# Patient Record
Sex: Female | Born: 1980 | Race: Black or African American | Hispanic: No | Marital: Single | Smoking: Never smoker
Health system: Southern US, Community
[De-identification: ages and names within clinical notes are randomized; demographics above are authoritative.]

## PROBLEM LIST (undated history)

## (undated) DIAGNOSIS — E785 Hyperlipidemia, unspecified: Secondary | ICD-10-CM

## (undated) DIAGNOSIS — E669 Obesity, unspecified: Secondary | ICD-10-CM

## (undated) DIAGNOSIS — G4733 Obstructive sleep apnea (adult) (pediatric): Secondary | ICD-10-CM

## (undated) DIAGNOSIS — D249 Benign neoplasm of unspecified breast: Secondary | ICD-10-CM

## (undated) DIAGNOSIS — I10 Essential (primary) hypertension: Secondary | ICD-10-CM

## (undated) DIAGNOSIS — D649 Anemia, unspecified: Secondary | ICD-10-CM

## (undated) DIAGNOSIS — D259 Leiomyoma of uterus, unspecified: Secondary | ICD-10-CM

## (undated) HISTORY — DX: Essential (primary) hypertension: I10

## (undated) HISTORY — DX: Obstructive sleep apnea (adult) (pediatric): G47.33

## (undated) HISTORY — DX: Benign neoplasm of unspecified breast: D24.9

## (undated) HISTORY — DX: Hyperlipidemia, unspecified: E78.5

## (undated) HISTORY — PX: ABDOMINAL HYSTERECTOMY: SHX81

## (undated) HISTORY — DX: Anemia, unspecified: D64.9

## (undated) HISTORY — DX: Obesity, unspecified: E66.9

## (undated) HISTORY — PX: BREAST BIOPSY: SHX20

## (undated) HISTORY — PX: UVULOPALATOPHARYNGOPLASTY: SHX827

## (undated) HISTORY — PX: TONSILLECTOMY AND ADENOIDECTOMY: SUR1326

---

## 2008-06-15 HISTORY — PX: BREAST EXCISIONAL BIOPSY: SUR124

## 2010-08-25 ENCOUNTER — Ambulatory Visit: Payer: Self-pay | Admitting: Family Medicine

## 2010-09-22 ENCOUNTER — Ambulatory Visit (INDEPENDENT_AMBULATORY_CARE_PROVIDER_SITE_OTHER): Payer: Managed Care, Other (non HMO) | Admitting: Family Medicine

## 2010-09-22 ENCOUNTER — Encounter: Payer: Self-pay | Admitting: Family Medicine

## 2010-09-22 VITALS — BP 140/100 | HR 90 | Temp 98.3°F | Wt 221.5 lb

## 2010-09-22 DIAGNOSIS — Z9889 Other specified postprocedural states: Secondary | ICD-10-CM

## 2010-09-22 DIAGNOSIS — E119 Type 2 diabetes mellitus without complications: Secondary | ICD-10-CM

## 2010-09-22 DIAGNOSIS — E663 Overweight: Secondary | ICD-10-CM

## 2010-09-22 DIAGNOSIS — D249 Benign neoplasm of unspecified breast: Secondary | ICD-10-CM

## 2010-09-22 DIAGNOSIS — G4733 Obstructive sleep apnea (adult) (pediatric): Secondary | ICD-10-CM

## 2010-09-22 DIAGNOSIS — I10 Essential (primary) hypertension: Secondary | ICD-10-CM

## 2010-09-22 DIAGNOSIS — N920 Excessive and frequent menstruation with regular cycle: Secondary | ICD-10-CM

## 2010-09-22 DIAGNOSIS — E785 Hyperlipidemia, unspecified: Secondary | ICD-10-CM

## 2010-09-22 MED ORDER — METFORMIN HCL 850 MG PO TABS: 850.0000 mg | ORAL_TABLET | Freq: Two times a day (BID) | ORAL | Status: DC

## 2010-09-22 NOTE — Patient Instructions (Addendum)
Start back walking. Come back for fasting labs.  You can get your results through our phone system.  Follow the instructions on the blue card. I'll await your records. Come back to for another lab visit in 3 months with a OV a few days after that.  Check your sugar before you come in for that visit.  Take care.   See Shirlee Limerick about your referral before your leave today.

## 2010-09-24 ENCOUNTER — Encounter: Payer: Self-pay | Admitting: Family Medicine

## 2010-09-24 ENCOUNTER — Other Ambulatory Visit (INDEPENDENT_AMBULATORY_CARE_PROVIDER_SITE_OTHER): Payer: Managed Care, Other (non HMO) | Admitting: Family Medicine

## 2010-09-24 ENCOUNTER — Telehealth: Payer: Self-pay | Admitting: *Deleted

## 2010-09-24 DIAGNOSIS — G4733 Obstructive sleep apnea (adult) (pediatric): Secondary | ICD-10-CM | POA: Insufficient documentation

## 2010-09-24 DIAGNOSIS — E119 Type 2 diabetes mellitus without complications: Secondary | ICD-10-CM

## 2010-09-24 DIAGNOSIS — I10 Essential (primary) hypertension: Secondary | ICD-10-CM | POA: Insufficient documentation

## 2010-09-24 DIAGNOSIS — E663 Overweight: Secondary | ICD-10-CM | POA: Insufficient documentation

## 2010-09-24 DIAGNOSIS — Z9889 Other specified postprocedural states: Secondary | ICD-10-CM | POA: Insufficient documentation

## 2010-09-24 DIAGNOSIS — E876 Hypokalemia: Secondary | ICD-10-CM

## 2010-09-24 DIAGNOSIS — E785 Hyperlipidemia, unspecified: Secondary | ICD-10-CM | POA: Insufficient documentation

## 2010-09-24 DIAGNOSIS — E1129 Type 2 diabetes mellitus with other diabetic kidney complication: Secondary | ICD-10-CM | POA: Insufficient documentation

## 2010-09-24 DIAGNOSIS — N920 Excessive and frequent menstruation with regular cycle: Secondary | ICD-10-CM | POA: Insufficient documentation

## 2010-09-24 LAB — COMPREHENSIVE METABOLIC PANEL
AST: 15 U/L (ref 0–37)
Albumin: 3.9 g/dL (ref 3.5–5.2)
BUN: 13 mg/dL (ref 6–23)
CO2: 33 mEq/L — ABNORMAL HIGH (ref 19–32)
Calcium: 8.6 mg/dL (ref 8.4–10.5)
Chloride: 99 mEq/L (ref 96–112)
Creatinine, Ser: 1 mg/dL (ref 0.4–1.2)
GFR: 80.81 mL/min (ref >60.00)
Glucose, Bld: 155 mg/dL — ABNORMAL HIGH (ref 70–99)
Potassium: 2.8 mEq/L — CL (ref 3.5–5.1)

## 2010-09-24 LAB — LIPID PANEL
Cholesterol: 101 mg/dL (ref 0–200)
LDL Cholesterol: 52 mg/dL (ref 0–99)
Triglycerides: 58 mg/dL (ref 0.0–149.0)

## 2010-09-24 MED ORDER — POTASSIUM CHLORIDE CRYS ER 20 MEQ PO TBCR: 20.0000 meq | EXTENDED_RELEASE_TABLET | Freq: Two times a day (BID) | ORAL | Status: DC

## 2010-09-24 NOTE — Assessment & Plan Note (Signed)
D/w pt about weight and diet, continue meds and return for labs.  

## 2010-09-24 NOTE — Assessment & Plan Note (Signed)
D/w pt about weight and diet, continue meds and return for labs.  Requesting records.

## 2010-09-24 NOTE — Assessment & Plan Note (Signed)
D/w pt about weight and diet

## 2010-09-24 NOTE — Progress Notes (Signed)
Diabetes:  Using medications without difficulties:yes Hypoglycemic episodes:no Hyperglycemic episodes:no Feet problems:no Blood Sugars averaging: rarely checked  Hypertension:    Using medication without problems or lightheadedness: yes Chest pain with exertion:no Edema:no Short of breath:no Average home BPs:no Requesting records.   Needs labs and needs referral for GYN eval.    Overweight, prev controlled with walking.  Hasn't been exercising.   PMH and SH reviewed  Meds, vitals, and allergies reviewed.   ROS: See HPI.  Otherwise negative.    GEN: nad, alert and oriented, overweight HEENT: mucous membranes moist, acanthosis noted on neck NECK: supple w/o LA CV: rrr. PULM: ctab, no inc wob ABD: soft, +bs EXT: no edema SKIN: no acute rash  Diabetic foot exam: Normal inspection No skin breakdown No calluses  Normal DP pulses Normal sensation to light touch and monofilament Nails normal

## 2010-09-24 NOTE — Telephone Encounter (Signed)
Critical labs potassium is 2.8 Results not in epic yet.

## 2010-09-24 NOTE — Telephone Encounter (Signed)
Patient advised as instructed via telephone.  She will call back to schedule lab appt.

## 2010-09-24 NOTE — Assessment & Plan Note (Signed)
Refer

## 2010-09-24 NOTE — Assessment & Plan Note (Signed)
D/w pt about weight and diet 

## 2010-09-24 NOTE — Telephone Encounter (Signed)
Please call pt.  K is low.  She needs to take K Dur twice a day for 5 days and then recheck K and aldosterone level here at the clinic (in about 5 days).  When I get the other labs back, I'll notify her.  Thanks.

## 2010-09-24 NOTE — Assessment & Plan Note (Signed)
D/w pt about weight and diet, continue meds and return for labs.

## 2010-10-01 ENCOUNTER — Telehealth: Payer: Self-pay | Admitting: Radiology

## 2010-10-01 ENCOUNTER — Other Ambulatory Visit (INDEPENDENT_AMBULATORY_CARE_PROVIDER_SITE_OTHER): Payer: Managed Care, Other (non HMO)

## 2010-10-01 DIAGNOSIS — E876 Hypokalemia: Secondary | ICD-10-CM

## 2010-10-01 LAB — POTASSIUM: Potassium: 2.7 mEq/L — CL (ref 3.5–5.1)

## 2010-10-01 MED ORDER — POTASSIUM CHLORIDE CRYS ER 20 MEQ PO TBCR: 20.0000 meq | EXTENDED_RELEASE_TABLET | Freq: Two times a day (BID) | ORAL | Status: DC

## 2010-10-01 NOTE — Telephone Encounter (Signed)
[facility] called with a critical K+ of 2.7.

## 2010-10-01 NOTE — Telephone Encounter (Signed)
Please notify patient - did she take kdur as prescribed last week?  Will want her to start kdur 20 mEq bid regularly (Sent in).  Also encourage more fruits/vegetables.  Will need to return in 1 wk for repeat potassium level.  Will route to PCP.

## 2010-10-02 NOTE — Telephone Encounter (Signed)
Patient notified. She said she did take the K+ as prescribed. She eats plenty of fruits and vegetables, so she's not sure what the problem is. She will restart the K+ and lab appt scheduled for next for repeat.

## 2010-10-02 NOTE — Telephone Encounter (Signed)
Message left for patient to return my call.  

## 2010-10-03 LAB — ALDOSTERONE: Aldosterone, Serum: 28 ng/dL

## 2010-10-04 NOTE — Telephone Encounter (Signed)
Please call pt.  I want to see the repeat K at the next draw.  The borderline high aldosterone level may potentially be related to the low K and the hypertension. I want to see the next value and then we'll contact her.

## 2010-10-05 NOTE — Telephone Encounter (Signed)
Patient advised.

## 2010-10-06 ENCOUNTER — Encounter: Payer: Self-pay | Admitting: Obstetrics & Gynecology

## 2010-10-06 ENCOUNTER — Ambulatory Visit (INDEPENDENT_AMBULATORY_CARE_PROVIDER_SITE_OTHER): Payer: Managed Care, Other (non HMO) | Admitting: Obstetrics & Gynecology

## 2010-10-06 VITALS — BP 133/95 | HR 72 | Ht 65.0 in | Wt 227.0 lb

## 2010-10-06 DIAGNOSIS — D259 Leiomyoma of uterus, unspecified: Secondary | ICD-10-CM

## 2010-10-06 DIAGNOSIS — Z01419 Encounter for gynecological examination (general) (routine) without abnormal findings: Secondary | ICD-10-CM

## 2010-10-06 DIAGNOSIS — Z113 Encounter for screening for infections with a predominantly sexual mode of transmission: Secondary | ICD-10-CM

## 2010-10-06 DIAGNOSIS — D219 Benign neoplasm of connective and other soft tissue, unspecified: Secondary | ICD-10-CM

## 2010-10-06 DIAGNOSIS — Z1272 Encounter for screening for malignant neoplasm of vagina: Secondary | ICD-10-CM

## 2010-10-06 DIAGNOSIS — Z3009 Encounter for other general counseling and advice on contraception: Secondary | ICD-10-CM

## 2010-10-06 MED ORDER — NORETHIN ACE-ETH ESTRAD-FE 1.5-30 MG-MCG PO TABS: 1.0000 | ORAL_TABLET | Freq: Every day | ORAL | Status: DC

## 2010-10-06 NOTE — Progress Notes (Signed)
  Subjective:     Susan Bridges is a 30 y.o. G0 female here for a routine exam.  Current complaints: history of fibroids, reports always having heavy menstrual periods, desires refill of OCPs. Denies intermenstrual bleeding or pain; reports history of anemia in the past.  Also reports history of fibroadenoma of the left breast which is stable; had negative biopsies in the past. Last breast imaging was in 01/2010.    Gynecologic History Patient's last menstrual period was 09/18/2010. Contraception: none Last Pap: 2011. Results were: normal  Obstetric History G0  The following portions of the patient's history were reviewed and updated as appropriate: allergies, current medications, past family history, past medical history, past social history, past surgical history and problem list.  Review of Systems Pertinent items are noted in HPI.    Objective:    GENERAL: Well-developed, well-nourished female in no acute distress.  HEENT: Normocephalic, atraumatic. Sclerae anicteric.  NECK: Supple. Normal thyroid.  LUNGS: Clear to auscultation bilaterally.  HEART: Regular rate and rhythm. BREASTS: Symmetric with everted nipples.  Large (~ 5cm) mass noted in the left breast under nipple and towards outer quadrants, well healed biopsy sites.  No other masses, skin changes, nipple drainage, lymphadenopathy bilaterally.. ABDOMEN: Soft, obese, nontender, nondistended. No organomegaly. PELVIC: Normal external female genitalia. Vagina is pink and rugated. Thick, white discharge. Normal cervix contour. Uterus is normal in size. No adnexal mass or tenderness. Pap smear and wet prep obtained.  EXTREMITIES: No cyanosis, clubbing, or edema, 2+ distal pulses.     Assessment:    Healthy female exam. Desires STI screen, OCPs and follow up of fibroids.   Plan:    Education reviewed: safe sex/STD prevention, self breast exams and management of fibroids. Contraception: OCP (estrogen/progesterone). Follow  up in: As needed for any concerning symptoms. Pelvic ultrasound ordered, will follow up results. Bleeding precautions reviewed; PCP to check CBC later this week.  May need endometrial biopsy if menorrhagia worsens or depending on ultrasound results.  Continue to follow up breast mass.   Follow up pap and STI screen. Counseled regarding following up with PCP regarding treatment of HTN; discussed risk of increased thromboembolic phenomenon and CV risks if HTN is uncontrolled, in the setting of combined OCPs. Discussed other forms of contraception and progestin-only methods, but she declined these methods.    Shila Kruczek A 10/06/2010 10:32 AM

## 2010-10-06 NOTE — Patient Instructions (Addendum)
Preventative Care for Adults - Female Studies show that half of deaths in the [place] today result from unhealthy lifestyle practices. This includes ignoring preventive care suggestions. Preventive health guidelines for women include the following key practices:  A routine yearly physical is a good way to check with your primary caregiver about your health and preventive screening. It is a chance to share any concerns and updates on your health, and to receive a thorough all-over exam.   If you smoke cigarettes, find out from your caregiver how to quit. It can literally save your life, no matter how long you have been a tobacco user. If you do not use tobacco, never start.   Maintain a healthy diet and normal weight. Increased weight leads to problems with blood pressure and diabetes. Decrease saturated fat in your diet and increase regular exercise. Eat a variety of foods, including fruit, vegetables, animal or vegetable protein (meat, fish, chicken, and eggs, or beans, lentils, and tofu), and grains, such as rice. Get information about proper diet from your caregiver, if needed.   Aerobic exercise helps maintain good heart health. The CDC and the American College of Sports Medicine recommend 30 minutes of moderate-intensity exercise (a brisk walk that increases your heart rate and breathing) on most days of the week. Ongoing high blood pressure should be treated with medicines, if weight loss and exercise are not effective.   Avoid smoking, drinking too much alcohol (more than two drinks per day), and use of street drugs. Do not share needles with anyone. Ask for professional help if you need support or instructions about stopping the use of alcohol, cigarettes, or drugs.   Maintain normal blood lipids and cholesterol, by minimizing your intake of saturated fat. Eat a well rounded diet, with plenty of fruit and vegetables. The National Institutes of Health encourage women to eat 5-9 servings of  fruit and vegetables each day. Your caregiver can give instructions to help you keep your risk of heart disease or stroke low. High blood pressure causes heart disease and increases risk of stroke. Blood pressure should be checked every 1-2 years, from age 20 onward.   Blood tests for high cholesterol, which causes heart and vessel disease, should begin at age 20 and be repeated every 5 years, if test results are normal. (Repeat tests more often if results are high.)   Diabetes screening involves taking a blood sample to check your blood sugar level, after a fasting period. This is done once every 3 years, after age 45, if test results are normal.   Breast cancer screening is essential to preventive care for women. All women age 20 and older should perform a breast self-exam every month. At age 40 and older, women should have their caregiver complete a breast exam each year. Women at ages 40-50 should have a mammogram (x-ray film) of the breasts each year. Your caregiver can discuss when to start your yearly mammograms.   Cervical cancer screening includes taking a Pap smear (sample of cells examined under a microscope) from the cervix (end of the uterus). It also includes testing for HPV (Human Papilloma Virus, which can cause cervical cancer). Screening and a pelvic exam should begin at age 21, or 3 years after a woman becomes sexually active. Screening should occur every year, with a Pap smear but no HPV testing, up to age 30. After age 30, you should have a Pap smear every 3 years with HPV testing, if no HPV was found previously.     Colon cancer can be detected, and often prevented, long before it is life threatening. Most routine colon cancer screening begins at the age of 50. On a yearly basis, your caregiver may provide easy-to-use take-home tests to check for hidden blood in the stool. Use of a small camera at the end of a tube, to directly examine the colon (sigmoidoscopy or colonoscopy), can  detect the earliest forms of colon cancer and can be life saving. Talk to your caregiver about this at age 50, when routine screening begins. (Screening is repeated every 5 years, unless early forms of pre-cancerous polyps or small growths are found.)   Practice safe sex. Use condoms. Condoms are used for birth control and to reduce the spread of sexually transmitted infections (STIs). Unsafe sex is sexual activity without the use of safeguards, such as condoms and avoidance of high-risk acts, to reduce the chances of getting or spreading STIs. STIs include gonorrhea (the clap), chlamydia, syphilis, trichimonas, herpes, HPV (human papilloma virus) and HIV (human immunodeficiency virus), which causes AIDS. Herpes, HIV, and HPV are viral illnesses that have no cure. They can result in disability, cancer, and death.   HPV causes cancer of the cervix, and other infections that can be transmitted from person to person. There is a vaccine for HPV, and females should get immunized between the ages of 11 and 26. It requires a series of 3 shots.   Osteoporosis is a disease in which the bones lose minerals and strength as we age. This can result in serious bone fractures. Risk of osteoporosis can be identified using a bone density scan. Women ages 65 and over should discuss this with their caregivers, as should women after menopause who have other risk factors. Ask your caregiver whether you should be taking a calcium supplement and Vitamin D, to reduce the rate of osteoporosis.   Menopause can be associated with physical symptoms and risks. Hormone replacement therapy is available to decrease these. You should talk to your caregiver about whether starting or continuing to take hormones is right for you.   Use sunscreen with SPF (skin protection factor) of 15 or more. Apply sunscreen liberally and repeatedly throughout the day. Being outside in the sun, when your shadow is shorter than you are, means you are being  exposed to sun at greater intensity. Lighter skinned people are at a greater risk of skin cancer. Wear sunglasses, to protect your eyes from too much damaging sunlight (which can speed up cataract formation).   Once a month, do a whole body skin exam or review, using a mirror to look at your back. Notify your caregiver of changes in moles, especially if there are changes in shapes, colors, irregular border, a size larger than a pencil eraser, or new moles develop.   Keep carbon monoxide and smoke detectors in your home, and functioning, at all times. Change the batteries every 6 months, or use a model that plugs into the wall.   Stay up to date with your tetanus shots and other required immunizations. A booster for tetanus should be given every 10 years. Be sure to get your flu shot every year, since 5%-20% of the [place] population comes down with the flu. The composition of the flu vaccine changes each year, so being vaccinated once is not enough. Get your shot in the fall, before the flu season peaks. The table below lists important vaccines to get. Other vaccines to consider include for Hepatitis A virus (to prevent a form of   infection of the liver, by a virus acquired from food), Varicella Zoster (a virus that causes shingles), and Meninogoccal (against bacteria which cause a form of meningitis).   Brush your teeth twice a day with fluoride toothpaste, and floss once a day. Good oral hygiene prevents tooth decay and gum disease, which can be painful and can cause other health problems. Visit your dentist for a routine oral and dental check up and preventive care every 6-12 months.   The Body Mass Index or BMI is a way of measuring how much of your body is fat. Having a BMI above 27 increases the risk of heart disease, diabetes, hypertension, stroke and other problems related to obesity. Your caregiver can help determine your BMI, and can develop an exercise and dietary program to help you achieve or  maintain this measurement at a healthy level.   Wear seat belts whenever you are in a vehicle, whether as passenger or driver, and even for short drives of a few minutes.   If you bicycle, wear a helmet at all times.  Preventative Care for Adult Women  Preventative Services Ages 76-39 Ages 2-64 Ages 47 and over  Health risk assessment and lifestyle counseling.     Blood pressure check.** Every 1-2 years Every 1-2 years Every 1-2 years  Total cholesterol check including HDL.** Every 5 years beginning at age 80 Every 5 years beginning at age 21, or more often if risk is high Every 5 years through age 29, then optional  Breast self exam. Monthly in all women ages 31 and older Monthly Monthly  Clinical breast exam.** Every 3 years beginning at age 71 Every year Every year  Mammogram.**  Every year beginning at age 23, optional from age 69-49 (discuss with your caregiver). Every year until age 69, then optional  Pap Smear** and HPV Screening. Every year from ages 34 through 53 Every 3 years from ages 69 through 22, if HPV is negative Optional; talk with your caregiver  Flexible sigmoidoscopy** or colonoscopy.**   Every 5 years beginning at age 10 Every 5 years until age 104; then optional  FOBT (fecal occult blood test) of stool.  Every year beginning at age 66 Every year until 50; then optional  Skin self-exam. Monthly Monthly Monthly  Tetanus-diphtheria (Td) immunization. Every 10 years Every 10 years Every 10 years  Influenza immunization.** Every year Every year Every year  HPV immunization. Once between the ages of 60 and 56     Pneumococcal immunization.** Optional Optional Every 5 years  Hepatitis B immunization.** Series of 3 immunizations  (if not done previously, usually given at 0, 1 to 2, and 4 to 6 months)  Check with your caregiver, if vaccination not previously given Check with your caregiver, if vaccination not previously given  ** Family history and personal history of risk and  conditions may change your caregiver's recommendations.  Document Released: 03/30/2001 Document Re-Released: 04/28/2009 [facility] Patient Information 2011 [place], [place].  Fibroids (Leiomyoma's) You have been diagnosed as having a fibroid. Fibroids are smooth muscle lumps (tumors) which can occur any place in a woman's body. They are usually in the womb (uterus). The most common problem (symptom) of fibroids is bleeding. Over time this may cause low red blood cells (anemia). Other symptoms include feelings of pressure and pain in the pelvis. The diagnosis (learning what is wrong) of fibroids is made by physical exam. Sometimes tests such as an ultrasound are used. This is helpful when fibroids are felt around  the ovaries and to look for tumors. TREATMENT  Most fibroids do not need surgical or medical treatment. Sometimes a tissue sample (biopsy) of the lining of the uterus is done to rule out cancer. If there is no cancer and only a small amount of bleeding, the problem can be watched.   Hormonal treatment can improve the problem.   When surgery is needed, it can consist of removing the fibroid. Vaginal birth may not be possible after the removal of fibroids. This depends on where they are and the extent of surgery. When pregnancy occurs with fibroids it is usually normal.   Your caregiver can help decide which treatments are best for you.  HOME CARE INSTRUCTIONS  Do not use aspirin as this may increase bleeding problems.   If your periods (menses) are heavy, record the number of pads or tampons used per month. Bring this information to your caregiver. This can help them determine the best treatment for you.  SEEK IMMEDIATE MEDICAL ATTENTION IF:  You have pelvic pain or cramps not controlled with medications, or experience a sudden increase in pain.   You have an increase of pelvic bleeding between and during menses.   You feel lightheaded or have fainting spells.   You develop worsening  belly (abdominal) pain.  Document Released: 01/30/2000 Document Re-Released: 05/11/2007 [facility] Patient Information 2011 [place], [place].

## 2010-10-07 LAB — WET PREP, GENITAL
Trich, Wet Prep: NONE SEEN
Yeast Wet Prep HPF POC: NONE SEEN

## 2010-10-07 LAB — HEPATITIS C ANTIBODY: HCV Ab: NEGATIVE

## 2010-10-09 ENCOUNTER — Other Ambulatory Visit (INDEPENDENT_AMBULATORY_CARE_PROVIDER_SITE_OTHER): Payer: Managed Care, Other (non HMO) | Admitting: Family Medicine

## 2010-10-09 DIAGNOSIS — E119 Type 2 diabetes mellitus without complications: Secondary | ICD-10-CM

## 2010-10-09 DIAGNOSIS — E876 Hypokalemia: Secondary | ICD-10-CM

## 2010-10-09 LAB — POTASSIUM: Potassium: 3.3 mEq/L — ABNORMAL LOW (ref 3.5–5.1)

## 2010-10-09 NOTE — Progress Notes (Signed)
Addended by: Alvina Chou on: 10/09/2010 09:39 AM   Modules accepted: Orders

## 2010-10-12 ENCOUNTER — Telehealth: Payer: Self-pay | Admitting: Family Medicine

## 2010-10-12 DIAGNOSIS — I10 Essential (primary) hypertension: Secondary | ICD-10-CM

## 2010-10-12 MED ORDER — SPIRONOLACTONE 25 MG PO TABS: 25.0000 mg | ORAL_TABLET | Freq: Every day | ORAL | Status: DC

## 2010-10-12 MED ORDER — TRIAMTERENE-HCTZ 37.5-25 MG PO CAPS: 1.0000 | ORAL_CAPSULE | ORAL | Status: DC

## 2010-10-12 NOTE — Telephone Encounter (Signed)
Patient advised. Appt scheduled. 

## 2010-10-12 NOTE — Telephone Encounter (Signed)
Please call pt.  I would dec the triamterene/HCTZ to 1 pill a day and add on spironolactone 25mg  a day.  Recheck BMET next week at OV with Dr Reece Agar (since I'll likely be out).  I have talked to him about this. The spironolactone may help with BP and correct the low K level (still a little low, but improved).  Stop the extra potassium in the meantime.   If the spironolactone helps, we may be able to taper her off some of other other meds.  Thanks.

## 2010-10-12 NOTE — Telephone Encounter (Signed)
Message copied by Lars Mage on Mon Oct 12, 2010  1:17 PM ------      Message from: Alvina Chou      Created: Fri Oct 09, 2010  2:33 PM                   ----- Message -----         From: [facility] [place] Interface         Sent: 10/09/2010   2:30 PM           To: Mills Koller

## 2010-10-22 ENCOUNTER — Other Ambulatory Visit: Payer: Self-pay | Admitting: Family Medicine

## 2010-10-22 ENCOUNTER — Encounter: Payer: Self-pay | Admitting: Family Medicine

## 2010-10-22 ENCOUNTER — Ambulatory Visit (INDEPENDENT_AMBULATORY_CARE_PROVIDER_SITE_OTHER): Payer: Managed Care, Other (non HMO) | Admitting: Family Medicine

## 2010-10-22 VITALS — BP 128/80 | HR 72 | Temp 98.8°F | Wt 231.0 lb

## 2010-10-22 DIAGNOSIS — G4733 Obstructive sleep apnea (adult) (pediatric): Secondary | ICD-10-CM

## 2010-10-22 DIAGNOSIS — I10 Essential (primary) hypertension: Secondary | ICD-10-CM

## 2010-10-22 LAB — BASIC METABOLIC PANEL
CO2: 25 mEq/L (ref 19–32)
Calcium: 8.7 mg/dL (ref 8.4–10.5)
Creatinine, Ser: 1 mg/dL (ref 0.4–1.2)
GFR: 87.68 mL/min (ref >60.00)
Glucose, Bld: 170 mg/dL — ABNORMAL HIGH (ref 70–99)
Sodium: 139 mEq/L (ref 135–145)

## 2010-10-22 MED ORDER — SPIRONOLACTONE 25 MG PO TABS: 50.0000 mg | ORAL_TABLET | Freq: Every day | ORAL | Status: DC

## 2010-10-22 MED ORDER — LOSARTAN POTASSIUM 100 MG PO TABS: 50.0000 mg | ORAL_TABLET | Freq: Every day | ORAL | Status: DC

## 2010-10-22 MED ORDER — LOSARTAN POTASSIUM 50 MG PO TABS: 50.0000 mg | ORAL_TABLET | Freq: Every day | ORAL | Status: DC

## 2010-10-22 NOTE — Assessment & Plan Note (Signed)
Aldosterone borderline high. Check BMP today, consider increase in spironolactone.  If increased, slowly back off losartan. Discussed with PCP today. F/u 1 mo.

## 2010-10-22 NOTE — Progress Notes (Signed)
  Subjective:    Patient ID: Susan Bridges, female    DOB: 06/04/1980, 30 y.o.   MRN: [id number]  HPI CC: med f/u, low potassium  F/u HTN and hypokalemia.  Some leg swelling, worse as day progresses.  Sometimes notes pitting edema.  Tries to avoid salt in diet, drinks plenty of water (recent increase).  Does eat good amt fruits and vegetables, vegetable with at least one meal a day, does snack on 1 fruit during day.  BP Readings from Last 3 Encounters:  10/22/10 128/80  10/06/10 133/95  09/22/10 140/100   No HA, vision changes, CP/tightness, SOB.    Started OCP 10/06/2010.  Uses for fibroids and menorrhagia.  H/o OSA, but not on CPAP or other treatment.  Review of Systems Per HPI    Objective:   Physical Exam  Nursing note and vitals reviewed. Constitutional: She appears well-developed and well-nourished. No distress.  HENT:  Head: Normocephalic and atraumatic.  Mouth/Throat: Oropharynx is clear and moist. No oropharyngeal exudate.  Eyes: Conjunctivae and EOM are normal. Pupils are equal, round, and reactive to light. No scleral icterus.  Neck: Normal range of motion. Neck supple. Carotid bruit is not present.  Cardiovascular: Normal rate, regular rhythm, normal heart sounds and intact distal pulses.   No murmur heard. Pulmonary/Chest: Effort normal and breath sounds normal. No respiratory distress. She has no wheezes. She has no rales.  Abdominal: Soft. There is no tenderness.       No abd/renal bruit  Lymphadenopathy:    She has no cervical adenopathy.  Skin: Skin is warm and dry. No rash noted.          Assessment & Plan:

## 2010-10-22 NOTE — Patient Instructions (Addendum)
We will recheck blood work today. Good to see you, return to see Dr. Algis Downs in 1 month for follow up of blood pressure.  It's looking good today. Continue to avoid salt. We will call you with blood work results and next step (possible med changes).

## 2010-10-22 NOTE — Telephone Encounter (Signed)
Please call pt.  Her labs are fine.  I talked with Dr. Reece Agar and he agrees with the following plan.  I would increase the spironolactone to 2 pills a day (total 50mg , she can take them together) and cut the losartan in half (down to 50mg ).  Recheck in 1 month.  Thanks.  I adjusted the med list.

## 2010-10-22 NOTE — Telephone Encounter (Signed)
Patient advised.

## 2010-10-22 NOTE — Assessment & Plan Note (Signed)
Consider rpt sleep study. Per pt, improved sxs.

## 2010-10-23 ENCOUNTER — Telehealth: Payer: Self-pay | Admitting: Family Medicine

## 2010-10-23 MED ORDER — SPIRONOLACTONE 50 MG PO TABS: 50.0000 mg | ORAL_TABLET | Freq: Every day | ORAL | Status: DC

## 2010-10-23 NOTE — Telephone Encounter (Signed)
I called pt and verified her med list.  She is okay with the plan.  She needs the spironolactone 50mg  resent to [facility] [address].  This was sent.  She'll see me in about 1 month.

## 2010-10-23 NOTE — Telephone Encounter (Signed)
Questions about meds with being placed on diuretic.  Please call patient [phone]. Thanks

## 2010-11-03 ENCOUNTER — Other Ambulatory Visit: Payer: Self-pay | Admitting: *Deleted

## 2010-11-03 MED ORDER — NEBIVOLOL HCL 10 MG PO TABS: 10.0000 mg | ORAL_TABLET | Freq: Two times a day (BID) | ORAL | Status: DC

## 2010-11-04 ENCOUNTER — Encounter: Payer: Self-pay | Admitting: Family Medicine

## 2010-11-04 ENCOUNTER — Ambulatory Visit (INDEPENDENT_AMBULATORY_CARE_PROVIDER_SITE_OTHER): Payer: Managed Care, Other (non HMO) | Admitting: Family Medicine

## 2010-11-04 DIAGNOSIS — B86 Scabies: Secondary | ICD-10-CM

## 2010-11-04 MED ORDER — IVERMECTIN 3 MG PO TABS: ORAL_TABLET | ORAL | Status: DC

## 2010-11-04 NOTE — Assessment & Plan Note (Signed)
Lesions and history most consistent with scabies outbreak. Treat with Ivermectin and repeat in two weeks.  Wash all clothes woprn and bedclothes and bedlinens.

## 2010-11-04 NOTE — Patient Instructions (Addendum)
Take Ivermectin as discussed. Repeat in 2 weeks.

## 2010-11-04 NOTE — Progress Notes (Signed)
  Subjective:    Patient ID: Susan Bridges, female    DOB: 1981/01/16, 30 y.o.   MRN: [id number]  HPI Pt here as acute appt twenty minutes late for a rash. 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 This was a few weeks ago. She noticed a week ago she had one lesion on her foot and then had a pedicure and had more lesions erupt the next day. They now involve both feet and legas but are fairly sparse. They are itchy, worse late in the day, do not seem to improve with scratching but do not itch all the time.   She was late due to working in [place] and she left with enough time but the traffic was awful.    Review of SystemsNoncontributory except as above.       Objective:   Physical Exam  Skin: Skin is warm and dry. Rash (individual lesions sparsely on the lower extremities and feet, mildly papular with erythema in the lesion , no halo and some with pinpoint  inclusion in the papule.) noted. No erythema.          Assessment & Plan:

## 2010-11-24 ENCOUNTER — Ambulatory Visit (INDEPENDENT_AMBULATORY_CARE_PROVIDER_SITE_OTHER): Payer: Managed Care, Other (non HMO) | Admitting: Family Medicine

## 2010-11-24 ENCOUNTER — Encounter: Payer: Self-pay | Admitting: Family Medicine

## 2010-11-24 VITALS — BP 106/72 | HR 67 | Temp 98.1°F | Wt 218.0 lb

## 2010-11-24 DIAGNOSIS — I10 Essential (primary) hypertension: Secondary | ICD-10-CM

## 2010-11-24 DIAGNOSIS — Z23 Encounter for immunization: Secondary | ICD-10-CM

## 2010-11-24 NOTE — Patient Instructions (Signed)
We'll contact you with your lab report. Take care.  Don't change your meds for now.

## 2010-11-24 NOTE — Progress Notes (Signed)
Hypertension:    Using medication without problems or lightheadedness: yes Chest pain with exertion:no Edema:no Short of breath:no Average home BPs: not checked Other issues: occ muscle cramps at night.   Spironolactone was increased and losartan was decreased.  Labs are pending.     Itching is better after treatment for scabies.  No new lesions.  Some old lesions are not yet fully healed on the feet.    Meds, vitals, and allergies reviewed.   PMH and SH reviewed  ROS: See HPI.  Otherwise negative.    GEN: nad, alert and oriented HEENT: mucous membranes moist NECK: supple w/o LA CV: rrr. PULM: ctab, no inc wob ABD: soft, +bs EXT: no edema SKIN: no acute rash

## 2010-11-25 ENCOUNTER — Encounter: Payer: Self-pay | Admitting: Family Medicine

## 2010-11-25 LAB — BASIC METABOLIC PANEL
CO2: 22 mEq/L (ref 19–32)
Calcium: 9.5 mg/dL (ref 8.4–10.5)
Chloride: 108 mEq/L (ref 96–112)
Potassium: 5.2 mEq/L — ABNORMAL HIGH (ref 3.5–5.1)
Sodium: 139 mEq/L (ref 135–145)

## 2010-11-25 NOTE — Assessment & Plan Note (Signed)
Prev labs and current meds d/w pt in detail. She is trying to avoid salt.  Will await pending labs, but will likely need CTA to eval for adenoma and RAS.  If RAS, would refer to vasc surgery/vir. If adenoma, refer to uro, if neither then would refer to renal, ie Dr. Arrie Aran.  She feels well in meantime and has no edema for the first time in a long while.  >25 min spent with face to face with patient, >50% counseling.

## 2010-12-05 LAB — ALDOSTERONE + RENIN ACTIVITY W/ RATIO
ALDO / PRA Ratio: 1.4 Ratio (ref 0.9–28.9)
PRA LC/MS/MS: 52.44 ng/mL/h — ABNORMAL HIGH (ref 0.25–5.82)

## 2010-12-06 ENCOUNTER — Telehealth: Payer: Self-pay | Admitting: Family Medicine

## 2010-12-06 DIAGNOSIS — R7989 Other specified abnormal findings of blood chemistry: Secondary | ICD-10-CM

## 2010-12-06 DIAGNOSIS — I10 Essential (primary) hypertension: Secondary | ICD-10-CM

## 2010-12-06 NOTE — Telephone Encounter (Signed)
Please notify pt.  Her final labs came back.  Her aldosterone level is up.  This is likely related to her BP being up.  I would check CT angio of kidneys to eval for a portion of her adrenal glands that could be overproducing aldosterone.  If this is found, it may be a correctable problem (via surgery to remove it).  I would need to see the CT report first.    I would do the following: stop losartan now. I took it off the med list.  Recheck BMET 3-5 days after stopping losartan.  Continue other meds, but then hold lisinopril for 2 days before the CT scan (the scan will likely be next week).  Drink plenty of water on those days before the scan.  The day after the scan, restart lisinopril.  Don't restart the losartan. I would expect her BP to come up slightly in the meantime, but this would be okay for the short term.  We can address the CT results when available.  I put in the order for the CT scan.  Thanks.  Please forward to [facility] as a FYI after pt is contacted.

## 2010-12-07 NOTE — Telephone Encounter (Signed)
Patient advised.  Lab appt scheduled on Friday, 4 days after stopping Losartan.  Patient is fine with scheduling CT scan.  She understands to hold the Lisinopril 2 days before the scan and restart it the day after the scan but hold Losartan indefinitely.  She would like for you to call her when you can.  She has some questions about the possibility of surgery and says she thought her BP had been better on her last few visits.

## 2010-12-07 NOTE — Telephone Encounter (Signed)
I called and LMOVM.  Will try to contact later.

## 2010-12-07 NOTE — Telephone Encounter (Signed)
I called pt and d/w her about the plan.  She understood.  Will proceed with med changes listed and will get labs later this week.  Will await BMET.  If this is acceptable, then will proceed with contrast study.  I talked with her about lowering risk for renal effect from ACE/ARB/Contrast combination.  She understood.

## 2010-12-08 ENCOUNTER — Other Ambulatory Visit: Payer: Self-pay | Admitting: Family Medicine

## 2010-12-08 DIAGNOSIS — E876 Hypokalemia: Secondary | ICD-10-CM

## 2010-12-11 ENCOUNTER — Other Ambulatory Visit (INDEPENDENT_AMBULATORY_CARE_PROVIDER_SITE_OTHER): Payer: Managed Care, Other (non HMO)

## 2010-12-11 DIAGNOSIS — E876 Hypokalemia: Secondary | ICD-10-CM

## 2010-12-11 LAB — BASIC METABOLIC PANEL
BUN: 17 mg/dL (ref 6–23)
Creatinine, Ser: 1.1 mg/dL (ref 0.4–1.2)
GFR: 71.71 mL/min (ref >60.00)
Glucose, Bld: 116 mg/dL — ABNORMAL HIGH (ref 70–99)
Potassium: 4.3 mEq/L (ref 3.5–5.1)

## 2010-12-15 ENCOUNTER — Other Ambulatory Visit: Payer: Managed Care, Other (non HMO)

## 2010-12-18 ENCOUNTER — Telehealth: Payer: Self-pay | Admitting: Family Medicine

## 2010-12-18 DIAGNOSIS — I10 Essential (primary) hypertension: Secondary | ICD-10-CM

## 2010-12-18 NOTE — Telephone Encounter (Signed)
I called pt. CT w/o adenoma or RAS.  Pelvic mass, likely fibroids (known h/o fibroids).  rec eval with u/s.  Pt was to have u/s via gyn clinic, but this isn't set up yet.  Pt to call gyn clinic about u/s.  I'll refer to renal. She's back on lisinopril; will check BP and notify me if BP is elevated.  Appreciate gyn/renal input.

## 2010-12-22 ENCOUNTER — Ambulatory Visit (HOSPITAL_COMMUNITY): Payer: Managed Care, Other (non HMO)

## 2010-12-23 ENCOUNTER — Encounter: Payer: Self-pay | Admitting: Family Medicine

## 2010-12-29 ENCOUNTER — Ambulatory Visit (HOSPITAL_COMMUNITY): Payer: Managed Care, Other (non HMO)

## 2010-12-31 ENCOUNTER — Ambulatory Visit (HOSPITAL_COMMUNITY)
Admission: RE | Admit: 2010-12-31 | Discharge: 2010-12-31 | Disposition: A | Discharge status: Home / Self Care | Payer: Managed Care, Other (non HMO) | Source: Ambulatory Visit | Attending: Obstetrics & Gynecology | Admitting: Obstetrics & Gynecology

## 2010-12-31 ENCOUNTER — Encounter: Payer: Self-pay | Admitting: Family Medicine

## 2010-12-31 DIAGNOSIS — D259 Leiomyoma of uterus, unspecified: Secondary | ICD-10-CM | POA: Insufficient documentation

## 2010-12-31 DIAGNOSIS — N949 Unspecified condition associated with female genital organs and menstrual cycle: Secondary | ICD-10-CM | POA: Insufficient documentation

## 2010-12-31 DIAGNOSIS — N938 Other specified abnormal uterine and vaginal bleeding: Secondary | ICD-10-CM | POA: Insufficient documentation

## 2010-12-31 DIAGNOSIS — D219 Benign neoplasm of connective and other soft tissue, unspecified: Secondary | ICD-10-CM

## 2011-01-04 ENCOUNTER — Encounter: Payer: Self-pay | Admitting: Family Medicine

## 2011-01-04 ENCOUNTER — Telehealth: Payer: Self-pay | Admitting: Family Medicine

## 2011-01-04 NOTE — Telephone Encounter (Signed)
Call from Dr. Cherylann Ratel.  Sprinololactone will be held to allow for recheck of aldosterone level.  Will be followed by renal.  I appreciate renal input.

## 2011-01-08 ENCOUNTER — Ambulatory Visit (INDEPENDENT_AMBULATORY_CARE_PROVIDER_SITE_OTHER): Payer: Managed Care, Other (non HMO) | Admitting: Family Medicine

## 2011-01-08 ENCOUNTER — Encounter: Payer: Self-pay | Admitting: Family Medicine

## 2011-01-08 DIAGNOSIS — M79609 Pain in unspecified limb: Secondary | ICD-10-CM

## 2011-01-08 DIAGNOSIS — M79669 Pain in unspecified lower leg: Secondary | ICD-10-CM

## 2011-01-08 NOTE — Patient Instructions (Signed)
I think you have a calf strain that will gradually get better.  I would wear heels or a heel wedge.  If it gets red/hot/swollen, then go to the ER (but I don't think that is going to happen).  Take care.

## 2011-01-08 NOTE — Progress Notes (Signed)
L calf pain.  She had a "cramp" in the calf.  Going on since Sunday.  Off spironolactone, but the sx predate stopping the medicine.  Drinking gatorade didn't help.  No trauma.  No history of DVT.  Pain was a little better this AM, better wearing heels.  No CP, not sob  She had the f/u pelvic u/s per gyn.  Was told she had fibroids.    Meds, vitals, and allergies reviewed.   ROS: See HPI.  Otherwise, noncontributory.  nad ncat No BLE edema.  L calf tender at midcalf, point tender but no bands, chords, erythema.  38" calf circ x2.  Distally nv intact.  Pain with dorsi/plantar flexion of L foot

## 2011-01-10 ENCOUNTER — Encounter: Payer: Self-pay | Admitting: Family Medicine

## 2011-01-10 DIAGNOSIS — M79669 Pain in unspecified lower leg: Secondary | ICD-10-CM | POA: Insufficient documentation

## 2011-01-10 NOTE — Assessment & Plan Note (Signed)
Low risk by well's criteria.  Likely gastrocnemius strain at the transition from muscle belly to tendon.  We heels/heel wedge to off load the muscle and f/u prn.   She understood.

## 2011-01-18 ENCOUNTER — Ambulatory Visit (INDEPENDENT_AMBULATORY_CARE_PROVIDER_SITE_OTHER): Payer: Managed Care, Other (non HMO) | Admitting: Obstetrics & Gynecology

## 2011-01-18 ENCOUNTER — Encounter: Payer: Self-pay | Admitting: Obstetrics & Gynecology

## 2011-01-18 VITALS — BP 106/70 | HR 69 | Ht 64.0 in | Wt 216.0 lb

## 2011-01-18 DIAGNOSIS — D219 Benign neoplasm of connective and other soft tissue, unspecified: Secondary | ICD-10-CM

## 2011-01-18 DIAGNOSIS — D259 Leiomyoma of uterus, unspecified: Secondary | ICD-10-CM

## 2011-01-18 NOTE — Progress Notes (Signed)
  Subjective:    Patient ID: Susan Bridges, female    DOB: 22-Jun-1980, 30 y.o.   MRN: [id number]  HPI  Susan Bridges is a 31 yo S AA G0 who is here to discuss her ultrasound findings.  She has been on OCPs for about 3 years to control her heavy periods.  They have accomplished their goal in that her period is much lighter than in the past.  She would like children in the future, but is not currently dating someone that she wants to have children with.  She denies complaints today.   Review of Systems    pap up to date and normal  Objective:   Physical Exam        Assessment & Plan:  Fibroids (submucosal and intramural) that are large but currently asymptomatic.  I will check a CBC and TSH. I have recommended that when she decides she wants to have a pregnancy that she consider a referral to Dr. April Manson (RE).

## 2011-01-19 LAB — CBC
HCT: 34.7 % — ABNORMAL LOW (ref 36.0–46.0)
MCHC: 32.3 g/dL (ref 30.0–36.0)
Platelets: 391 10*3/uL (ref 150–400)
RDW: 13.4 % (ref 11.5–15.5)
WBC: 8.4 10*3/uL (ref 4.0–10.5)

## 2011-01-20 ENCOUNTER — Other Ambulatory Visit: Payer: Self-pay | Admitting: Internal Medicine

## 2011-01-20 MED ORDER — ATORVASTATIN CALCIUM 20 MG PO TABS: 20.0000 mg | ORAL_TABLET | Freq: Every day | ORAL | Status: DC

## 2011-01-20 NOTE — Telephone Encounter (Signed)
Patient requested Rx refill, she stated that I was the second person that she has spoken to regarding this Rx refill.  I apologized to the patient and advised that I will send in the Rx refill.

## 2011-01-25 ENCOUNTER — Other Ambulatory Visit: Payer: Self-pay | Admitting: *Deleted

## 2011-01-25 MED ORDER — LISINOPRIL 30 MG PO TABS: 30.0000 mg | ORAL_TABLET | Freq: Every day | ORAL | Status: DC

## 2011-02-16 HISTORY — PX: ADRENALECTOMY: SHX876

## 2011-03-03 ENCOUNTER — Other Ambulatory Visit: Payer: Self-pay | Admitting: Internal Medicine

## 2011-03-03 MED ORDER — DOXAZOSIN MESYLATE 4 MG PO TABS: 4.0000 mg | ORAL_TABLET | Freq: Two times a day (BID) | ORAL | Status: DC

## 2011-03-03 NOTE — Telephone Encounter (Signed)
Sent refill to pharmacy. 

## 2011-04-26 ENCOUNTER — Encounter: Payer: Self-pay | Admitting: Family Medicine

## 2011-06-04 ENCOUNTER — Other Ambulatory Visit: Payer: Self-pay

## 2011-06-04 DIAGNOSIS — Z3009 Encounter for other general counseling and advice on contraception: Secondary | ICD-10-CM

## 2011-06-04 MED ORDER — TRIAMTERENE-HCTZ 37.5-25 MG PO CAPS: 1.0000 | ORAL_CAPSULE | ORAL | Status: DC

## 2011-06-04 MED ORDER — LISINOPRIL 30 MG PO TABS: 30.0000 mg | ORAL_TABLET | Freq: Every day | ORAL | Status: DC

## 2011-06-04 MED ORDER — DILTIAZEM HCL ER BEADS 420 MG PO CP24: 420.0000 mg | ORAL_CAPSULE | Freq: Every day | ORAL | Status: DC

## 2011-06-04 MED ORDER — DOXAZOSIN MESYLATE 4 MG PO TABS: 4.0000 mg | ORAL_TABLET | Freq: Two times a day (BID) | ORAL | Status: DC

## 2011-06-04 MED ORDER — NEBIVOLOL HCL 10 MG PO TABS: 10.0000 mg | ORAL_TABLET | Freq: Two times a day (BID) | ORAL | Status: DC

## 2011-06-04 MED ORDER — METFORMIN HCL 850 MG PO TABS: 850.0000 mg | ORAL_TABLET | Freq: Two times a day (BID) | ORAL | Status: DC

## 2011-06-04 MED ORDER — ATORVASTATIN CALCIUM 20 MG PO TABS: 20.0000 mg | ORAL_TABLET | Freq: Every day | ORAL | Status: DC

## 2011-06-04 NOTE — Telephone Encounter (Signed)
Pt left v/m needed meds refilled to new mail order pharmacy. No meds or pharmacy noted.left v/m for pt to call back.

## 2011-06-04 NOTE — Telephone Encounter (Signed)
Sent all Rx's to [facility] at patient's request except for this one.  Wasn't sure about this.

## 2011-06-06 NOTE — Telephone Encounter (Signed)
Verify with patient and then please send in #3 packs and 3 rf.  Thanks.

## 2011-06-07 MED ORDER — NORETHIN ACE-ETH ESTRAD-FE 1.5-30 MG-MCG PO TABS: 1.0000 | ORAL_TABLET | Freq: Every day | ORAL | Status: DC

## 2011-06-07 MED ORDER — NEBIVOLOL HCL 10 MG PO TABS: 10.0000 mg | ORAL_TABLET | Freq: Two times a day (BID) | ORAL | Status: DC

## 2011-06-07 NOTE — Telephone Encounter (Signed)
Spoke to patient by telephone and verified that this medication is correct. Patient states that she just realized that she is out of her Bystolic and needs a script for that sent to [facility] for a 2 week supply to last her until she gets her mail order from the pharmacy.

## 2011-06-07 NOTE — Telephone Encounter (Signed)
Sent!

## 2011-07-21 ENCOUNTER — Encounter: Payer: Self-pay | Admitting: Family Medicine

## 2011-07-21 DIAGNOSIS — E269 Hyperaldosteronism, unspecified: Secondary | ICD-10-CM | POA: Insufficient documentation

## 2011-09-12 ENCOUNTER — Encounter: Payer: Self-pay | Admitting: Family Medicine

## 2011-09-24 ENCOUNTER — Encounter: Payer: Self-pay | Admitting: Family Medicine

## 2011-09-24 ENCOUNTER — Ambulatory Visit (INDEPENDENT_AMBULATORY_CARE_PROVIDER_SITE_OTHER): Payer: Managed Care, Other (non HMO) | Admitting: Family Medicine

## 2011-09-24 VITALS — BP 102/78 | HR 59 | Temp 98.3°F | Wt 208.0 lb

## 2011-09-24 DIAGNOSIS — E269 Hyperaldosteronism, unspecified: Secondary | ICD-10-CM

## 2011-09-24 NOTE — Patient Instructions (Addendum)
Take 5mg  of bystolic twice a day for the next few days and then stop it.  Call me with BP readings and pulse after seeing endo next week so we can make arrangements for labs and further med changes.

## 2011-09-24 NOTE — Assessment & Plan Note (Signed)
BP improved.  Will taper off bystolic. Has f/u with [facility] and endo next week.  Will have pt call me with BP update after endo visit to arrange for labs and further taper if needed (as long as endo clinic is okay with that plan).

## 2011-09-24 NOTE — Progress Notes (Signed)
R adrenalectomy and BP has been much lower, 130-140s systolic on home checks, but the cuff may be reading high.  Not dizzy on standing.  No CP, SOB, BLE edema.  She feels much better after the surgery.  Post op pain is almost totally resolved.    Meds, vitals, and allergies reviewed.   ROS: See HPI.  Otherwise, noncontributory.   Current Outpatient Prescriptions on File Prior to Visit  Medication Sig Dispense Refill  . atorvastatin (LIPITOR) 20 MG tablet Take 1 tablet (20 mg total) by mouth daily.  90 tablet  2  . diltiazem (TIAZAC) 420 MG 24 hr capsule Take 1 capsule (420 mg total) by mouth daily.  90 capsule  2  . Ferrous Sulfate (IRON) 325 (65 FE) MG TABS Take by mouth 2 (two) times daily.        Marland Kitchen lisinopril (PRINIVIL,ZESTRIL) 30 MG tablet Take 1 tablet (30 mg total) by mouth daily.  90 tablet  2  . metFORMIN (GLUCOPHAGE) 850 MG tablet Take 1 tablet (850 mg total) by mouth 2 (two) times daily with a meal.  180 tablet  2  . nebivolol (BYSTOLIC) 10 MG tablet Take 1 tablet (10 mg total) by mouth 2 (two) times daily.  180 tablet  2  . norethindrone-ethinyl estradiol-iron (MICROGESTIN FE,GILDESS FE,LOESTRIN FE) 1.5-30 MG-MCG tablet Take 1 tablet by mouth daily.  3 Package  3  . triamterene-hydrochlorothiazide (DYAZIDE) 37.5-25 MG per capsule Take 1 each (1 capsule total) by mouth every morning.  90 capsule  2  . DISCONTD: nebivolol (BYSTOLIC) 10 MG tablet Take 1 tablet (10 mg total) by mouth 2 (two) times daily.  30 tablet  0    GEN: nad, alert and oriented NECK: supple w/o LA CV: rrr.  PULM: ctab, no inc wob ABD: soft, +bs, surgical sites healing x4 w/o erythema EXT: no edema SKIN: no acute rash

## 2011-10-05 ENCOUNTER — Telehealth: Payer: Self-pay

## 2011-10-05 NOTE — Telephone Encounter (Signed)
Pt seen 09/24/11. Pt has not taken Bystolic since last week. BP ranging from 110 - 130 /70-85 with pulse rate usually 70-90 but x 2 was 100. No complaints; no dizziness, h/a,chest pain,SOB. Pt will wait for call back of plan. [facility].

## 2011-10-05 NOTE — Telephone Encounter (Signed)
I would leave her meds as is for now.  If it gets lower then we can taper meds. Have her check it periodically.  Make sure she has calibrated her cuff to make sure it's accurate. Thanks.

## 2011-10-06 NOTE — Telephone Encounter (Signed)
Patient notified as instructed by telephone. Patient will continue to check it periodically and will keep Dr. Para March informed.

## 2011-10-15 ENCOUNTER — Ambulatory Visit (INDEPENDENT_AMBULATORY_CARE_PROVIDER_SITE_OTHER): Payer: Managed Care, Other (non HMO) | Admitting: Obstetrics & Gynecology

## 2011-10-15 ENCOUNTER — Encounter: Payer: Self-pay | Admitting: Obstetrics & Gynecology

## 2011-10-15 VITALS — BP 127/98 | HR 95 | Ht 64.0 in | Wt 207.0 lb

## 2011-10-15 DIAGNOSIS — N920 Excessive and frequent menstruation with regular cycle: Secondary | ICD-10-CM

## 2011-10-15 DIAGNOSIS — Z1151 Encounter for screening for human papillomavirus (HPV): Secondary | ICD-10-CM

## 2011-10-15 DIAGNOSIS — N898 Other specified noninflammatory disorders of vagina: Secondary | ICD-10-CM

## 2011-10-15 DIAGNOSIS — Z124 Encounter for screening for malignant neoplasm of cervix: Secondary | ICD-10-CM

## 2011-10-15 DIAGNOSIS — Z113 Encounter for screening for infections with a predominantly sexual mode of transmission: Secondary | ICD-10-CM

## 2011-10-15 DIAGNOSIS — N841 Polyp of cervix uteri: Secondary | ICD-10-CM

## 2011-10-15 DIAGNOSIS — N760 Acute vaginitis: Secondary | ICD-10-CM

## 2011-10-15 DIAGNOSIS — B9689 Other specified bacterial agents as the cause of diseases classified elsewhere: Secondary | ICD-10-CM

## 2011-10-15 DIAGNOSIS — Z01419 Encounter for gynecological examination (general) (routine) without abnormal findings: Secondary | ICD-10-CM

## 2011-10-15 DIAGNOSIS — A5901 Trichomonal vulvovaginitis: Secondary | ICD-10-CM

## 2011-10-15 DIAGNOSIS — S01512A Laceration without foreign body of oral cavity, initial encounter: Secondary | ICD-10-CM

## 2011-10-15 DIAGNOSIS — A499 Bacterial infection, unspecified: Secondary | ICD-10-CM

## 2011-10-15 MED ORDER — LIDOCAINE HCL 2 % EX GEL: CUTANEOUS | Status: DC | PRN

## 2011-10-15 NOTE — Progress Notes (Addendum)
Subjective:     Susan Bridges is a 31 y.o. G0 female and is here for a comprehensive physical gynecologic exam. The patient reports having vaginal discharge and vulvar irritation. She treated with the discharge with two does of Monistat-1 one week apart but noticed more pruritus.  After  "rubbing" the area, she noticed a tear in upper inner part of her labium minus that burns with urination and sitting in certain positions. She wants this evaluated.  She has a history of DUB and fibroids managed on OCPs, no bleeding abnormalities reported. She also underwent recent adrenelectomy for hyperaldosteronism.  History   Social History  . Marital Status: Single    Spouse Name: N/A    Number of Children: N/A  . Years of Education: N/A   Occupational History  . Not on file.   Social History Main Topics  . Smoking status: Never Smoker   . Smokeless tobacco: Never Used  . Alcohol Use: Yes     socially  . Drug Use: No  . Sexually Active: Not Currently -- Female partner(s)    Birth Control/ Protection: Pill   Other Topics Concern  . Not on file   Social History Narrative   From [place] [id number] grad, masters in social work   Health Maintenance  Topic Date Due  . Tetanus/tdap  10/28/1997  . Influenza Vaccine  11/16/2011  . Pap Smear  10/05/2013    The following portions of the patient's history were reviewed and updated as appropriate: allergies, current medications, past family history, past medical history, past social history, past surgical history and problem list.  Review of Systems Pertinent items are noted in HPI.   Objective:   Blood pressure 127/98, pulse 95, height 5\' 4"  (1.626 m), weight 207 lb (93.895 kg), last menstrual period 09/18/2011. GENERAL: Well-developed, well-nourished female in no acute distress.  HEENT: Normocephalic, atraumatic. Sclerae anicteric.  NECK: Supple. Normal thyroid.  LUNGS: Clear to auscultation bilaterally.  HEART: Regular rate and  rhythm. BREASTS: Symmetric with everted nipples, well-healed biopsy site on left areola (had fibroadenoma). No masses, skin changes, nipple drainage, or lymphadenopathy. ABDOMEN: Soft, nontender, nondistended. No organomegaly. PELVIC:  7 mm right periurethral/inner labium minus laceration noted. Small 3 mm round ulceration/laceration on contralateral side. HSV culture taken from lesions.  No other lesions seen. Vagina is pink and rugated.  Thin yellow discharge noted, sample obtained for wet prep. Small 7 mm friable polypoid tissue noted at external os of cervix discharge.  Pap smear obtained. 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 Unable to palpate adnexa, no tenderness on bimanual exam.  EXTREMITIES: No cyanosis, clubbing, or edema, 2+ distal pulses.   CERVICAL POLYPECTOMY NOTE Risks of the biopsy including pain, bleeding, infection, inadequate specimen, and need for additional procedures were discussed. The patient stated understanding and agreed to undergo procedure today. Verbal consent obtained.  The patient's cervix was prepped with Betadine.  Ring forceps were used to grasp the polypoid lesion and the polypoid lesions was removed by twisting it off its base.  Tissue sent to pathology for analysis. Small bleeding was noted and hemostasis was achieved using silver nitrate sticks.  The patient tolerated the procedure well. Post-procedure instructions were given to the patient. The patient is to call with heavy bleeding, fever greater than 100.4, foul smelling vaginal discharge or other concerns.    Assessment:    Annual gynecologic exam with pap smear Vaginal discharge Vulvar laceration/lesion Cervical polypoid lesion   Plan:  Will follow up pap smear, wet prep, HSV culture and pathology results and manage accordingly. Prescribed lidocaine gel to be applied to vulvar for analgesia, patient instructed on how to use peribottle and warm water during urination  to decrease amount of irritation Routine preventative health maintenance measures emphasized  Lab Addendum 10/22/11  Normal pap smear and negative HPV, will repeat in three years or earlier if indicated. Negative gonorrhea and chlamydia. Negative HSV culture and candida. Pap smear sample analysis positive for trichomonas and bacterial vaginosis. Tinidazole prescribed, will cover both BV and trichomonas.  Patient informed of diagnosis, advised to tell her partner about the trichomonas in order for the partner to seek treatment to prevent reinfection. She was also advised to use condoms until the partner is treated.

## 2011-10-15 NOTE — Patient Instructions (Addendum)
Preventive Care for Adults, Female A healthy lifestyle and preventive care can promote health and wellness. Preventive health guidelines for women include the following key practices.  A routine yearly physical is a good way to check with your caregiver about your health and preventive screening. It is a chance to share any concerns and updates on your health, and to receive a thorough exam.   Visit your dentist for a routine exam and preventive care every 6 months. Brush your teeth twice a day and floss once a day. Good oral hygiene prevents tooth decay and gum disease.   The frequency of eye exams is based on your age, health, family medical history, use of contact lenses, and other factors. Follow your caregiver's recommendations for frequency of eye exams.   Eat a healthy diet. Foods like vegetables, fruits, whole grains, low-fat dairy products, and lean protein foods contain the nutrients you need without too many calories. Decrease your intake of foods high in solid fats, added sugars, and salt. Eat the right amount of calories for you.Get information about a proper diet from your caregiver, if necessary.   Regular physical exercise is one of the most important things you can do for your health. Most adults should get at least 150 minutes of moderate-intensity exercise (any activity that increases your heart rate and causes you to sweat) each week. In addition, most adults need muscle-strengthening exercises on 2 or more days a week.   Maintain a healthy weight. The body mass index (BMI) is a screening tool to identify possible weight problems. It provides an estimate of body fat based on height and weight. Your caregiver can help determine your BMI, and can help you achieve or maintain a healthy weight.For adults 20 years and older:   A BMI below 18.5 is considered underweight.   A BMI of 18.5 to 24.9 is normal.   A BMI of 25 to 29.9 is considered overweight.   A BMI of 30 and above is  considered obese.   Maintain normal blood lipids and cholesterol levels by exercising and minimizing your intake of saturated fat. Eat a balanced diet with plenty of fruit and vegetables. Blood tests for lipids and cholesterol should begin at age 20 and be repeated every 5 years. If your lipid or cholesterol levels are high, you are over 50, or you are at high risk for heart disease, you may need your cholesterol levels checked more frequently.Ongoing high lipid and cholesterol levels should be treated with medicines if diet and exercise are not effective.   If you smoke, find out from your caregiver how to quit. If you do not use tobacco, do not start.   If you are pregnant, do not drink alcohol. If you are breastfeeding, be very cautious about drinking alcohol. If you are not pregnant and choose to drink alcohol, do not exceed 1 drink per day. One drink is considered to be 12 ounces (355 mL) of beer, 5 ounces (148 mL) of wine, or 1.5 ounces (44 mL) of liquor.   Avoid use of street drugs. Do not share needles with anyone. Ask for help if you need support or instructions about stopping the use of drugs.   High blood pressure causes heart disease and increases the risk of stroke. Your blood pressure should be checked at least every 1 to 2 years. Ongoing high blood pressure should be treated with medicines if weight loss and exercise are not effective.   If you are 55 to 33   years old, ask your caregiver if you should take aspirin to prevent strokes.   Diabetes screening involves taking a blood sample to check your fasting blood sugar level. This should be done once every 3 years, after age 45, if you are within normal weight and without risk factors for diabetes. Testing should be considered at a younger age or be carried out more frequently if you are overweight and have at least 1 risk factor for diabetes.   Breast cancer screening is essential preventive care for women. You should practice "breast  self-awareness." This means understanding the normal appearance and feel of your breasts and may include breast self-examination. Any changes detected, no matter how small, should be reported to a caregiver. Women in their 20s and 30s should have a clinical breast exam (CBE) by a caregiver as part of a regular health exam every 1 to 3 years. After age 40, women should have a CBE every year. Starting at age 40, women should consider having a mammography (breast X-ray test) every year. Women who have a family history of breast cancer should talk to their caregiver about genetic screening. Women at a high risk of breast cancer should talk to their caregivers about having magnetic resonance imaging (MRI) and a mammography every year.   The Pap test is a screening test for cervical cancer. A Pap test can show cell changes on the cervix that might become cervical cancer if left untreated. A Pap test is a procedure in which cells are obtained and examined from the lower end of the uterus (cervix).   Women should have a Pap test starting at age 21.   Between ages 21 and 29, Pap tests should be repeated every 2 years.   Beginning at age 30, you should have a Pap test every 3 years as long as the past 3 Pap tests have been normal.   Some women have medical problems that increase the chance of getting cervical cancer. Talk to your caregiver about these problems. It is especially important to talk to your caregiver if a new problem develops soon after your last Pap test. In these cases, your caregiver may recommend more frequent screening and Pap tests.   The above recommendations are the same for women who have or have not gotten the vaccine for human papillomavirus (HPV).   If you had a hysterectomy for a problem that was not cancer or a condition that could lead to cancer, then you no longer need Pap tests. Even if you no longer need a Pap test, a regular exam is a good idea to make sure no other problems are  starting.   If you are between ages 65 and 70, and you have had normal Pap tests going back 10 years, you no longer need Pap tests. Even if you no longer need a Pap test, a regular exam is a good idea to make sure no other problems are starting.   If you have had past treatment for cervical cancer or a condition that could lead to cancer, you need Pap tests and screening for cancer for at least 20 years after your treatment.   If Pap tests have been discontinued, risk factors (such as a new sexual partner) need to be reassessed to determine if screening should be resumed.   The HPV test is an additional test that may be used for cervical cancer screening. The HPV test looks for the virus that can cause the cell changes on the cervix.   The cells collected during the Pap test can be tested for HPV. The HPV test could be used to screen women aged 30 years and older, and should be used in women of any age who have unclear Pap test results. After the age of 30, women should have HPV testing at the same frequency as a Pap test.   Colorectal cancer can be detected and often prevented. Most routine colorectal cancer screening begins at the age of 50 and continues through age 75. However, your caregiver may recommend screening at an earlier age if you have risk factors for colon cancer. On a yearly basis, your caregiver may provide home test kits to check for hidden blood in the stool. Use of a small camera at the end of a tube, to directly examine the colon (sigmoidoscopy or colonoscopy), can detect the earliest forms of colorectal cancer. Talk to your caregiver about this at age 50, when routine screening begins. Direct examination of the colon should be repeated every 5 to 10 years through age 75, unless early forms of pre-cancerous polyps or small growths are found.   Hepatitis C blood testing is recommended for all people born from 1945 through 1965 and any individual with known risks for hepatitis C.    Practice safe sex. Use condoms and avoid high-risk sexual practices to reduce the spread of sexually transmitted infections (STIs). STIs include gonorrhea, chlamydia, syphilis, trichomonas, herpes, HPV, and human immunodeficiency virus (HIV). Herpes, HIV, and HPV are viral illnesses that have no cure. They can result in disability, cancer, and death. Sexually active women aged 25 and younger should be checked for chlamydia. Older women with new or multiple partners should also be tested for chlamydia. Testing for other STIs is recommended if you are sexually active and at increased risk.   Osteoporosis is a disease in which the bones lose minerals and strength with aging. This can result in serious bone fractures. The risk of osteoporosis can be identified using a bone density scan. Women ages 65 and over and women at risk for fractures or osteoporosis should discuss screening with their caregivers. Ask your caregiver whether you should take a calcium supplement or vitamin D to reduce the rate of osteoporosis.   Menopause can be associated with physical symptoms and risks. Hormone replacement therapy is available to decrease symptoms and risks. You should talk to your caregiver about whether hormone replacement therapy is right for you.   Use sunscreen with sun protection factor (SPF) of 30 or more. Apply sunscreen liberally and repeatedly throughout the day. You should seek shade when your shadow is shorter than you. Protect yourself by wearing long sleeves, pants, a wide-brimmed hat, and sunglasses year round, whenever you are outdoors.   Once a month, do a whole body skin exam, using a mirror to look at the skin on your back. Notify your caregiver of new moles, moles that have irregular borders, moles that are larger than a pencil eraser, or moles that have changed in shape or color.   Stay current with required immunizations.   Influenza. You need a dose every fall (or winter). The composition of  the flu vaccine changes each year, so being vaccinated once is not enough.   Pneumococcal polysaccharide. You need 1 to 2 doses if you smoke cigarettes or if you have certain chronic medical conditions. You need 1 dose at age 65 (or older) if you have never been vaccinated.   Tetanus, diphtheria, pertussis (Tdap, Td). Get 1 dose of   Tdap vaccine if you are younger than age 65, are over 65 and have contact with an infant, are a healthcare worker, are pregnant, or simply want to be protected from whooping cough. After that, you need a Td booster dose every 10 years. Consult your caregiver if you have not had at least 3 tetanus and diphtheria-containing shots sometime in your life or have a deep or dirty wound.   HPV. You need this vaccine if you are a woman age 26 or younger. The vaccine is given in 3 doses over 6 months.   Measles, mumps, rubella (MMR). You need at least 1 dose of MMR if you were born in 1957 or later. You may also need a second dose.   Meningococcal. If you are age 19 to 21 and a first-year college student living in a residence hall, or have one of several medical conditions, you need to get vaccinated against meningococcal disease. You may also need additional booster doses.   Zoster (shingles). If you are age 60 or older, you should get this vaccine.   Varicella (chickenpox). If you have never had chickenpox or you were vaccinated but received only 1 dose, talk to your caregiver to find out if you need this vaccine.   Hepatitis A. You need this vaccine if you have a specific risk factor for hepatitis A virus infection or you simply wish to be protected from this disease. The vaccine is usually given as 2 doses, 6 to 18 months apart.   Hepatitis B. You need this vaccine if you have a specific risk factor for hepatitis B virus infection or you simply wish to be protected from this disease. The vaccine is given in 3 doses, usually over 6 months.  Preventive Services /  Frequency Ages 19 to 39  Blood pressure check.** / Every 1 to 2 years.   Lipid and cholesterol check.** / Every 5 years beginning at age 20.   Clinical breast exam.** / Every 3 years for women in their 20s and 30s.   Pap test.** / Every 2 years from ages 21 through 29. Every 3 years starting at age 30 through age 65 or 70 with a history of 3 consecutive normal Pap tests.   HPV screening.** / Every 3 years from ages 30 through ages 65 to 70 with a history of 3 consecutive normal Pap tests.   Hepatitis C blood test.** / For any individual with known risks for hepatitis C.   Skin self-exam. / Monthly.   Influenza immunization.** / Every year.   Pneumococcal polysaccharide immunization.** / 1 to 2 doses if you smoke cigarettes or if you have certain chronic medical conditions.   Tetanus, diphtheria, pertussis (Tdap, Td) immunization. / A one-time dose of Tdap vaccine. After that, you need a Td booster dose every 10 years.   HPV immunization. / 3 doses over 6 months, if you are 26 and younger.   Measles, mumps, rubella (MMR) immunization. / You need at least 1 dose of MMR if you were born in 1957 or later. You may also need a second dose.   Meningococcal immunization. / 1 dose if you are age 19 to 21 and a first-year college student living in a residence hall, or have one of several medical conditions, you need to get vaccinated against meningococcal disease. You may also need additional booster doses.   Varicella immunization.** / Consult your caregiver.   Hepatitis A immunization.** / Consult your caregiver. 2 doses, 6 to 18 months   apart.   Hepatitis B immunization.** / Consult your caregiver. 3 doses usually over 6 months.  Ages 40 to 64  Blood pressure check.** / Every 1 to 2 years.   Lipid and cholesterol check.** / Every 5 years beginning at age 20.   Clinical breast exam.** / Every year after age 40.   Mammogram.** / Every year beginning at age 40 and continuing for as  long as you are in good health. Consult with your caregiver.   Pap test.** / Every 3 years starting at age 30 through age 65 or 70 with a history of 3 consecutive normal Pap tests.   HPV screening.** / Every 3 years from ages 30 through ages 65 to 70 with a history of 3 consecutive normal Pap tests.   Fecal occult blood test (FOBT) of stool. / Every year beginning at age 50 and continuing until age 75. You may not need to do this test if you get a colonoscopy every 10 years.   Flexible sigmoidoscopy or colonoscopy.** / Every 5 years for a flexible sigmoidoscopy or every 10 years for a colonoscopy beginning at age 50 and continuing until age 75.   Hepatitis C blood test.** / For all people born from 1945 through 1965 and any individual with known risks for hepatitis C.   Skin self-exam. / Monthly.   Influenza immunization.** / Every year.   Pneumococcal polysaccharide immunization.** / 1 to 2 doses if you smoke cigarettes or if you have certain chronic medical conditions.   Tetanus, diphtheria, pertussis (Tdap, Td) immunization.** / A one-time dose of Tdap vaccine. After that, you need a Td booster dose every 10 years.   Measles, mumps, rubella (MMR) immunization. / You need at least 1 dose of MMR if you were born in 1957 or later. You may also need a second dose.   Varicella immunization.** / Consult your caregiver.   Meningococcal immunization.** / Consult your caregiver.   Hepatitis A immunization.** / Consult your caregiver. 2 doses, 6 to 18 months apart.   Hepatitis B immunization.** / Consult your caregiver. 3 doses, usually over 6 months.  Ages 65 and over  Blood pressure check.** / Every 1 to 2 years.   Lipid and cholesterol check.** / Every 5 years beginning at age 20.   Clinical breast exam.** / Every year after age 40.   Mammogram.** / Every year beginning at age 40 and continuing for as long as you are in good health. Consult with your caregiver.   Pap test.** /  Every 3 years starting at age 30 through age 65 or 70 with a 3 consecutive normal Pap tests. Testing can be stopped between 65 and 70 with 3 consecutive normal Pap tests and no abnormal Pap or HPV tests in the past 10 years.   HPV screening.** / Every 3 years from ages 30 through ages 65 or 70 with a history of 3 consecutive normal Pap tests. Testing can be stopped between 65 and 70 with 3 consecutive normal Pap tests and no abnormal Pap or HPV tests in the past 10 years.   Fecal occult blood test (FOBT) of stool. / Every year beginning at age 50 and continuing until age 75. You may not need to do this test if you get a colonoscopy every 10 years.   Flexible sigmoidoscopy or colonoscopy.** / Every 5 years for a flexible sigmoidoscopy or every 10 years for a colonoscopy beginning at age 50 and continuing until age 75.   Hepatitis   C blood test.** / For all people born from 1945 through 1965 and any individual with known risks for hepatitis C.   Osteoporosis screening.** / A one-time screening for women ages 65 and over and women at risk for fractures or osteoporosis.   Skin self-exam. / Monthly.   Influenza immunization.** / Every year.   Pneumococcal polysaccharide immunization.** / 1 dose at age 65 (or older) if you have never been vaccinated.   Tetanus, diphtheria, pertussis (Tdap, Td) immunization. / A one-time dose of Tdap vaccine if you are over 65 and have contact with an infant, are a healthcare worker, or simply want to be protected from whooping cough. After that, you need a Td booster dose every 10 years.   Varicella immunization.** / Consult your caregiver.   Meningococcal immunization.** / Consult your caregiver.   Hepatitis A immunization.** / Consult your caregiver. 2 doses, 6 to 18 months apart.   Hepatitis B immunization.** / Check with your caregiver. 3 doses, usually over 6 months.  ** Family history and personal history of risk and conditions may change your caregiver's  recommendations. Document Released: 03/30/2001 Document Revised: 01/21/2011 Document Reviewed: 06/29/2010 [employer] Patient Information 2012 [employer]. 

## 2011-10-16 LAB — WET PREP, GENITAL
Clue Cells Wet Prep HPF POC: NONE SEEN
Trich, Wet Prep: NONE SEEN
Yeast Wet Prep HPF POC: NONE SEEN

## 2011-10-22 MED ORDER — TINIDAZOLE 500 MG PO TABS: 2.0000 g | ORAL_TABLET | Freq: Every day | ORAL | Status: DC

## 2011-10-22 NOTE — Addendum Note (Signed)
Addended by: Jaynie Collins A on: 10/22/2011 08:24 AM   Modules accepted: Orders

## 2011-10-25 ENCOUNTER — Encounter: Payer: Self-pay | Admitting: Family Medicine

## 2011-10-25 ENCOUNTER — Ambulatory Visit (INDEPENDENT_AMBULATORY_CARE_PROVIDER_SITE_OTHER): Payer: Managed Care, Other (non HMO) | Admitting: Family Medicine

## 2011-10-25 VITALS — BP 108/84 | HR 89 | Temp 98.3°F | Wt 207.0 lb

## 2011-10-25 DIAGNOSIS — K649 Unspecified hemorrhoids: Secondary | ICD-10-CM

## 2011-10-25 MED ORDER — HYDROCORTISONE ACETATE 25 MG RE SUPP: 25.0000 mg | Freq: Two times a day (BID) | RECTAL | Status: AC

## 2011-10-25 NOTE — Patient Instructions (Addendum)
Use the suppositories as needed, try not to strain, and take a stool softener.  Take care.

## 2011-10-25 NOTE — Assessment & Plan Note (Signed)
Mildly tender, external, not in need of thrombectomy.  Use hydrocortisone suppositories, stool softener, avoid straining/reading on toilet and f/u prn.  Anatomy d/w pt.  She understood.

## 2011-10-25 NOTE — Progress Notes (Signed)
Hemorrhoids.  She thinks she had them before but started having pain about 2-3 days ago.  BRBPR with wiping/BM. Using OTC cream.  Still with rectal discomfort sitting.  No FCNAVD.  No other bruising or bleeding.   Meds, vitals, and allergies reviewed.   ROS: See HPI.  Otherwise, noncontributory.  nad Chaperoned exam.  Ext hemorrhoid. No bleeding.  No fissure.

## 2011-12-13 ENCOUNTER — Other Ambulatory Visit: Payer: Self-pay | Admitting: *Deleted

## 2011-12-13 DIAGNOSIS — Z3009 Encounter for other general counseling and advice on contraception: Secondary | ICD-10-CM

## 2011-12-13 MED ORDER — NORETHIN ACE-ETH ESTRAD-FE 1.5-30 MG-MCG PO TABS: 1.0000 | ORAL_TABLET | Freq: Every day | ORAL | Status: DC

## 2011-12-24 ENCOUNTER — Telehealth: Payer: Self-pay

## 2011-12-24 NOTE — Telephone Encounter (Signed)
Pt request letter that she can have trainer for exercise program. Pt said also needs to include has no restrictions on lifting or exercising at work. Pt wants letter faxed to her;fax # [phone] pt request cover letter with pts name. Pt request call back when faxed. Pt wants to know when she should have another f/u appt with Dr Para March and when should have next lab appt.Please advise.

## 2011-12-24 NOTE — Telephone Encounter (Signed)
Letter faxed.  Patient advised.  30 min OV scheduled for 02/29/12 with labs at visit if needed.

## 2011-12-24 NOTE — Telephone Encounter (Signed)
Would be reasonable to recheck in 1/14. We can do labs at the visit if needed. visit.   Letter printed.

## 2012-01-26 ENCOUNTER — Telehealth: Payer: Self-pay

## 2012-01-26 NOTE — Telephone Encounter (Signed)
Left message asking patient to call back

## 2012-01-26 NOTE — Telephone Encounter (Signed)
I would prefer to see this first before sending her to derm.  If she refuses, then notify me and I'll put in the referral. Thanks.

## 2012-01-26 NOTE — Telephone Encounter (Signed)
Pt has noticed on two toes a dark brown stripe or band. Pt has shown to a doctor before and was told normal and does not want to schedule appt; pt wants to see dermatologist to make sure this is normal.Please advise.

## 2012-01-27 NOTE — Telephone Encounter (Signed)
Scheduled OV on 01/28/12.

## 2012-01-28 ENCOUNTER — Ambulatory Visit: Payer: Managed Care, Other (non HMO) | Admitting: Family Medicine

## 2012-01-31 ENCOUNTER — Encounter: Payer: Self-pay | Admitting: Family Medicine

## 2012-01-31 ENCOUNTER — Ambulatory Visit (INDEPENDENT_AMBULATORY_CARE_PROVIDER_SITE_OTHER): Payer: Managed Care, Other (non HMO) | Admitting: Family Medicine

## 2012-01-31 VITALS — BP 116/84 | HR 96 | Temp 98.2°F | Wt 213.8 lb

## 2012-01-31 DIAGNOSIS — L609 Nail disorder, unspecified: Secondary | ICD-10-CM | POA: Insufficient documentation

## 2012-01-31 NOTE — Assessment & Plan Note (Signed)
Offered derm eval but this isn't necessary.  Reassured pt re: 1st nail.  5th nail with likely hyperpigmentation at the base of the nail bed and this isn't a cancerous or alarming finding.  Is common.  F/u prn, or if nail changes progress then refer to derm if she requests.  No charge.

## 2012-01-31 NOTE — Progress Notes (Signed)
Nail changes.  R 1st and 5th nail.  Wanted eval.   Meds, vitals, and allergies reviewed.   ROS: See HPI.  Otherwise, noncontributory.  R5th nail with longitudinal hyperpigmented strip in the nail, not deep to nail.   R1st nail with mild thickening c/w onychomycosis changes.

## 2012-01-31 NOTE — Patient Instructions (Addendum)
If you have other nail changes, then notify us and we'll set up the dermatology referral.   Take care.

## 2012-02-28 ENCOUNTER — Other Ambulatory Visit: Payer: Self-pay | Admitting: Family Medicine

## 2012-02-29 ENCOUNTER — Encounter: Payer: Self-pay | Admitting: *Deleted

## 2012-02-29 ENCOUNTER — Ambulatory Visit (INDEPENDENT_AMBULATORY_CARE_PROVIDER_SITE_OTHER): Payer: Managed Care, Other (non HMO) | Admitting: Family Medicine

## 2012-02-29 ENCOUNTER — Encounter: Payer: Self-pay | Admitting: Family Medicine

## 2012-02-29 VITALS — BP 118/84 | HR 90 | Temp 98.5°F | Wt 214.0 lb

## 2012-02-29 DIAGNOSIS — Z23 Encounter for immunization: Secondary | ICD-10-CM

## 2012-02-29 DIAGNOSIS — E119 Type 2 diabetes mellitus without complications: Secondary | ICD-10-CM

## 2012-02-29 DIAGNOSIS — I10 Essential (primary) hypertension: Secondary | ICD-10-CM

## 2012-02-29 DIAGNOSIS — E785 Hyperlipidemia, unspecified: Secondary | ICD-10-CM

## 2012-02-29 DIAGNOSIS — E269 Hyperaldosteronism, unspecified: Secondary | ICD-10-CM

## 2012-02-29 LAB — LIPID PANEL
HDL: 36.1 mg/dL — ABNORMAL LOW (ref >39.00)
Triglycerides: 77 mg/dL (ref 0.0–149.0)

## 2012-02-29 LAB — COMPREHENSIVE METABOLIC PANEL
BUN: 16 mg/dL (ref 6–23)
CO2: 22 mEq/L (ref 19–32)
Calcium: 9.4 mg/dL (ref 8.4–10.5)
Chloride: 106 mEq/L (ref 96–112)
Creatinine, Ser: 1.3 mg/dL — ABNORMAL HIGH (ref 0.4–1.2)
GFR: 60.54 mL/min (ref >60.00)
Total Bilirubin: 0.4 mg/dL (ref 0.3–1.2)

## 2012-02-29 NOTE — Assessment & Plan Note (Signed)
Much improved, continue current meds and see notes on labs.

## 2012-02-29 NOTE — Patient Instructions (Addendum)
Go to the lab on the way out.  We'll contact you with your lab report. Plan on rechecking in 6 months.   Take care.

## 2012-02-29 NOTE — Progress Notes (Signed)
Diabetes:  Using medications without difficulties: yes Hypoglycemic episodes: no sx Hyperglycemic episodes: no sx Feet problems: no Blood Sugars averaging: not checked eye exam within last year: yes rx for meter/strips given to patient, hand written.   Hypertension:    Using medication without problems or lightheadedness: yes Chest pain with exertion:no Edema:no Short of breath:no  Elevated Cholesterol: Using medications without problems:yes Muscle aches: she has occ/rare foot cramps at night but this is limited and may be due to overstretching Diet compliance: discussed, she is "so/so" Exercise: discussed, working with a trainer  PMH and SH reviewed.   Vital signs, Meds and allergies reviewed.  ROS: See HPI.  Otherwise nontributory.   GEN: nad, alert and oriented HEENT: mucous membranes moist NECK: supple w/o LA CV: rrr  PULM: ctab, no inc wob ABD: soft, +bs EXT: no edema SKIN: no acute rash  Diabetic foot exam: Normal inspection No skin breakdown No calluses  Normal DP pulses Normal sensation to light tough and monofilament Nails normal

## 2012-02-29 NOTE — Assessment & Plan Note (Signed)
continue current meds and see notes on labs.  D/w pt about weight and diet/exercise.  

## 2012-02-29 NOTE — Assessment & Plan Note (Signed)
continue current meds and see notes on labs.  D/w pt about weight and diet/exercise.

## 2012-03-27 ENCOUNTER — Other Ambulatory Visit: Payer: Self-pay

## 2012-03-27 MED ORDER — TRIAMTERENE-HCTZ 37.5-25 MG PO CAPS: 1.0000 | ORAL_CAPSULE | ORAL | Status: DC

## 2012-03-27 MED ORDER — LISINOPRIL 30 MG PO TABS: 30.0000 mg | ORAL_TABLET | Freq: Every day | ORAL | Status: DC

## 2012-03-27 NOTE — Telephone Encounter (Signed)
Pt left v/m requesting refill lisinopril and triamterene HCTZ to [facility].Patient notified as instructed by telephone v/m refill done.

## 2012-04-25 ENCOUNTER — Other Ambulatory Visit: Payer: Self-pay

## 2012-04-25 DIAGNOSIS — Z3009 Encounter for other general counseling and advice on contraception: Secondary | ICD-10-CM

## 2012-04-25 MED ORDER — NORETHIN ACE-ETH ESTRAD-FE 1.5-30 MG-MCG PO TABS: 1.0000 | ORAL_TABLET | Freq: Every day | ORAL | Status: DC

## 2012-04-25 NOTE — Telephone Encounter (Signed)
Pt had gotten one refill from local [facility] and [facility] told pt would need to get new rx sent to [facility]. Advised pt done.

## 2012-06-16 ENCOUNTER — Ambulatory Visit (INDEPENDENT_AMBULATORY_CARE_PROVIDER_SITE_OTHER): Payer: Managed Care, Other (non HMO) | Admitting: Family Medicine

## 2012-06-16 ENCOUNTER — Encounter: Payer: Self-pay | Admitting: Family Medicine

## 2012-06-16 VITALS — BP 102/78 | HR 80 | Temp 98.2°F | Wt 212.5 lb

## 2012-06-16 DIAGNOSIS — M542 Cervicalgia: Secondary | ICD-10-CM

## 2012-06-16 NOTE — Patient Instructions (Addendum)
Go to the lab on the way out.  We'll contact you with your lab report. We may need to get a neck ultrasound done (thyroid) if your lab is abnormal or your pain doesn't improve/resolve.  Let me know if you aren't improved.  Take care.

## 2012-06-16 NOTE — Progress Notes (Signed)
Neck pain.  Started before a recent trip to the [place]. Anterior/right neck pain. No L sided sx.  It is still tender. Ibuprofen would help temporarily, was taking it BID.  Pain is more lateral than midline now. She felt a lump in her neck on the R side prev. It hurts less than it did initially.  She had pain with head turning initially.  Normal ROM in neck now.  No trauma to set this sx off initially.  She thought it was because she had slept wrong initially. She had been told prev that she has a mildly enlarged thyroid.    No FCNAVD.  No dysphagia now, had some pain with swallowing initially.    Meds, vitals, and allergies reviewed.   ROS: See HPI.  Otherwise, noncontributory.  nad ncat Tm wnl  Nasal and OP exam wnl Neck supple, slightly ttp to the R of midline anteriorly Thyroid feels diffusely enlarged but I don't feel a focal nodule No LA No bruit noted rrr ctab

## 2012-06-18 DIAGNOSIS — M542 Cervicalgia: Secondary | ICD-10-CM | POA: Insufficient documentation

## 2012-06-18 NOTE — Assessment & Plan Note (Signed)
Could be MSK in origin.  Nontoxic.  TSH wnl.  Would observe for now.  If not resolved, then we can set up thyroid u/s.  D/w pt, she agrees, she'll notify us if not better.

## 2012-06-19 ENCOUNTER — Encounter: Payer: Self-pay | Admitting: *Deleted

## 2012-07-14 ENCOUNTER — Other Ambulatory Visit: Payer: Self-pay

## 2012-07-14 DIAGNOSIS — Z3009 Encounter for other general counseling and advice on contraception: Secondary | ICD-10-CM

## 2012-07-14 MED ORDER — NORETHIN ACE-ETH ESTRAD-FE 1.5-30 MG-MCG PO TABS: 1.0000 | ORAL_TABLET | Freq: Every day | ORAL | Status: DC

## 2012-07-14 NOTE — Telephone Encounter (Signed)
Pt called back and has not listened to v/m. Pt advised refill at [facility].

## 2012-07-14 NOTE — Telephone Encounter (Signed)
Pt left v/m requesting BC pill rx sent to [facility]; pt is out of town in [place]; pt will have prescription transferred from [facility] to a pharmacy in [place]. Left v/m for pt request was done.

## 2012-08-24 ENCOUNTER — Other Ambulatory Visit: Payer: Self-pay

## 2012-08-28 ENCOUNTER — Encounter: Payer: Self-pay | Admitting: Family Medicine

## 2012-08-28 ENCOUNTER — Ambulatory Visit (INDEPENDENT_AMBULATORY_CARE_PROVIDER_SITE_OTHER): Payer: Managed Care, Other (non HMO) | Admitting: Family Medicine

## 2012-08-28 VITALS — BP 116/82 | HR 86 | Temp 98.1°F | Wt 209.2 lb

## 2012-08-28 DIAGNOSIS — R5383 Other fatigue: Secondary | ICD-10-CM

## 2012-08-28 DIAGNOSIS — E119 Type 2 diabetes mellitus without complications: Secondary | ICD-10-CM

## 2012-08-28 DIAGNOSIS — I10 Essential (primary) hypertension: Secondary | ICD-10-CM

## 2012-08-28 DIAGNOSIS — N920 Excessive and frequent menstruation with regular cycle: Secondary | ICD-10-CM

## 2012-08-28 LAB — BASIC METABOLIC PANEL
BUN: 14 mg/dL (ref 6–23)
CO2: 22 mEq/L (ref 19–32)
Calcium: 9.6 mg/dL (ref 8.4–10.5)
Chloride: 106 mEq/L (ref 96–112)
Creatinine, Ser: 1.3 mg/dL — ABNORMAL HIGH (ref 0.4–1.2)

## 2012-08-28 LAB — HEMOGLOBIN A1C: Hgb A1c MFr Bld: 6.9 % — ABNORMAL HIGH (ref 4.6–6.5)

## 2012-08-28 LAB — CBC WITH DIFFERENTIAL/PLATELET
Basophils Absolute: 0 10*3/uL (ref 0.0–0.1)
Eosinophils Absolute: 0.2 10*3/uL (ref 0.0–0.7)
Lymphocytes Relative: 32.9 % (ref 12.0–46.0)
MCHC: 33.3 g/dL (ref 30.0–36.0)
MCV: 94.4 fl (ref 78.0–100.0)
Monocytes Absolute: 0.6 10*3/uL (ref 0.1–1.0)
Neutrophils Relative %: 57.9 % (ref 43.0–77.0)
Platelets: 355 10*3/uL (ref 150.0–400.0)

## 2012-08-28 MED ORDER — METFORMIN HCL 850 MG PO TABS: ORAL_TABLET | ORAL | Status: DC

## 2012-08-28 NOTE — Assessment & Plan Note (Signed)
Continue diet, see notes on labs. Taper metformin if needed for GI sx.  She agrees.  Recheck in about 6 months at CPE.

## 2012-08-28 NOTE — Assessment & Plan Note (Signed)
Controlled, continue current meds.  Recheck in about 6 months at CPE.  See notes on labs.  She agrees.

## 2012-08-28 NOTE — Progress Notes (Signed)
Neck pain had resolved so we didn't get the thyroid u/s.   TSH prev wnl.  Some fatigue, h/o iron def anemia.  Less heavy menses with OCPs.    Diabetes:  Using medications without difficulties:yes, except with some GI upset with metformin Hypoglycemic episodes:no Hyperglycemic episodes:no Feet problems:no Blood Sugars averaging: not checked often eye exam within last year: yes  Hypertension:  Using medication without problems or lightheadedness: yes Chest pain with exertion:no Edema:no Short of breath:no Average home BPs:not checked often  PMH and SH reviewed.   Vital signs, Meds and allergies reviewed.  ROS: See HPI.  Otherwise nontributory.   GEN: nad, alert and oriented HEENT: mucous membranes moist NECK: supple w/o LA CV: rrr. PULM: ctab, no inc wob ABD: soft, +bs EXT: no edema SKIN: no acute rash  Diabetic foot exam: Normal inspection No skin breakdown No calluses  Normal DP pulses Normal sensation to light tough and monofilament Nails normal

## 2012-08-28 NOTE — Assessment & Plan Note (Signed)
Controlled with OCPs.  Check CBC today.  Fatigue noted.

## 2012-08-28 NOTE — Patient Instructions (Addendum)
Go to the lab on the way out.  We'll contact you with your lab report.  Cut back on the metformin as needed for GI symptoms.  Recheck in about 6 months at a physical.  Don't change your other meds for now.  I would get a flu shot each fall.

## 2012-09-06 ENCOUNTER — Other Ambulatory Visit: Payer: Self-pay | Admitting: Family Medicine

## 2012-09-14 ENCOUNTER — Telehealth: Payer: Self-pay

## 2012-09-14 NOTE — Telephone Encounter (Signed)
Pt has had h/a consistently for 2 days; Ibuprofen OTC taking 400 mg does relieve h/a for a while; pt has discussed her level of stress that is work related and pt request note to be out of work on 09/15/12.  BP was 128/87 at 7pm on 09/13/12 and pt is taking her meds as instructed. Pt wanted to see if a day off from stress at work might take care of h/a.Please advise. Pt request cb.

## 2012-09-14 NOTE — Telephone Encounter (Signed)
Note done, if not improved, then let me know.  Thanks.

## 2012-09-14 NOTE — Telephone Encounter (Signed)
Patient advised. Note left at front desk for pick up.  

## 2012-10-24 ENCOUNTER — Ambulatory Visit: Payer: Managed Care, Other (non HMO) | Admitting: Obstetrics & Gynecology

## 2012-10-31 ENCOUNTER — Ambulatory Visit: Payer: Managed Care, Other (non HMO) | Admitting: Obstetrics & Gynecology

## 2012-11-03 ENCOUNTER — Ambulatory Visit: Payer: Managed Care, Other (non HMO) | Admitting: Obstetrics & Gynecology

## 2012-11-16 ENCOUNTER — Other Ambulatory Visit: Payer: Self-pay | Admitting: Family Medicine

## 2012-11-21 ENCOUNTER — Other Ambulatory Visit: Payer: Self-pay | Admitting: Family Medicine

## 2012-11-21 NOTE — Telephone Encounter (Signed)
Electronic refill request. Patient is past due for Pap.  Please advise.

## 2012-11-21 NOTE — Telephone Encounter (Signed)
She appears to be up to date on pap.  Will defer this rx to the gyn clinic.  Will forward.  Notify pt in meantime.  Thanks.

## 2012-12-06 ENCOUNTER — Ambulatory Visit (INDEPENDENT_AMBULATORY_CARE_PROVIDER_SITE_OTHER): Payer: Managed Care, Other (non HMO) | Admitting: Obstetrics & Gynecology

## 2012-12-06 ENCOUNTER — Encounter: Payer: Self-pay | Admitting: Obstetrics & Gynecology

## 2012-12-06 VITALS — BP 126/100 | HR 91 | Ht 64.0 in | Wt 211.8 lb

## 2012-12-06 DIAGNOSIS — Z803 Family history of malignant neoplasm of breast: Secondary | ICD-10-CM

## 2012-12-06 DIAGNOSIS — Z124 Encounter for screening for malignant neoplasm of cervix: Secondary | ICD-10-CM

## 2012-12-06 DIAGNOSIS — D259 Leiomyoma of uterus, unspecified: Secondary | ICD-10-CM

## 2012-12-06 DIAGNOSIS — Z9889 Other specified postprocedural states: Secondary | ICD-10-CM

## 2012-12-06 DIAGNOSIS — Z01419 Encounter for gynecological examination (general) (routine) without abnormal findings: Secondary | ICD-10-CM

## 2012-12-06 DIAGNOSIS — Z1151 Encounter for screening for human papillomavirus (HPV): Secondary | ICD-10-CM

## 2012-12-06 DIAGNOSIS — D219 Benign neoplasm of connective and other soft tissue, unspecified: Secondary | ICD-10-CM | POA: Insufficient documentation

## 2012-12-06 NOTE — Progress Notes (Signed)
  Subjective:     Susan Bridges is a 32 y.o. G0 female and is here for a comprehensive physical exam. The patient reports no problems.  She desires screening mammogram due to a history of having a fibroadenoma excised a  Couple years ago and extensive FH of breast cancer. She has several maternal and paternal aunts with breast cancer; her mother underwent lumpectomy for a benign mass.  She said she was told she needs frequent surveillance.  Also wants to know if her fibroids are getting bigger, menorrhagia is managed effectively on OCPs.   History   Social History  . Marital Status: Single    Spouse Name: N/A    Number of Children: N/A  . Years of Education: N/A   Occupational History  . Not on file.   Social History Main Topics  . Smoking status: Never Smoker   . Smokeless tobacco: Never Used  . Alcohol Use: Yes     Comment: socially  . Drug Use: No  . Sexual Activity: Not Currently    Partners: Male    Birth Control/ Protection: Pill   Other Topics Concern  . Not on file   Social History Narrative   From [place]   To [place] 2012   masters in social work   Health Maintenance  Topic Date Due  . Pneumococcal Polysaccharide Vaccine (##1) 10/28/1980  . Influenza Vaccine  09/15/2012  . Foot Exam  02/28/2013  . Hemoglobin A1c  02/28/2013  . Ophthalmology Exam  06/15/2013  . Pap Smear  10/15/2014  . Tetanus/tdap  02/28/2022    The following portions of the patient's history were reviewed and updated as appropriate: allergies, current medications, past family history, past medical history, past social history, past surgical history and problem list.  Review of Systems Pertinent items are noted in HPI.   Objective:   BP 126/100  Pulse 91  Ht 5\' 4"  (1.626 m)  Wt 211 lb 12.8 oz (96.072 kg)  BMI 36.34 kg/m2  LMP 11/21/2012 GENERAL: Well-developed, well-nourished female in no acute distress.  HEENT: Normocephalic, atraumatic. Sclerae anicteric.  NECK: Supple.  Normal thyroid.  LUNGS: Clear to auscultation bilaterally.  HEART: Regular rate and rhythm. BREASTS: Symmetric in size. No masses, skin changes, nipple drainage, or lymphadenopathy. Healed excision site on left breast. ABDOMEN: Soft, nontender, nondistended. No organomegaly. PELVIC: Normal external female genitalia. Vagina is pink and rugated.  Normal discharge. Normal cervix contour. Pap smear obtained. Uterus is enlarged to 18-20 week size and globular. No adnexal mass or tenderness.  EXTREMITIES: No cyanosis, clubbing, or edema, 2+ distal pulses.   Assessment:    Healthy female exam. Status post excision of benign breast mass, extensive FH of breast cancer Fibroids    Plan:   Pap done, will follow up results and manage accordingly. Mammogram scheduled.  Pelvic ultrasound also scheduled.  Will follow up results and manage accordingly. Routine preventative health maintenance measures emphasized

## 2012-12-06 NOTE — Patient Instructions (Signed)
Preventive Care for Adults, Female A healthy lifestyle and preventive care can promote health and wellness. Preventive health guidelines for women include the following key practices.  A routine yearly physical is a good way to check with your caregiver about your health and preventive screening. It is a chance to share any concerns and updates on your health, and to receive a thorough exam.  Visit your dentist for a routine exam and preventive care every 6 months. Brush your teeth twice a day and floss once a day. Good oral hygiene prevents tooth decay and gum disease.  The frequency of eye exams is based on your age, health, family medical history, use of contact lenses, and other factors. Follow your caregiver's recommendations for frequency of eye exams.  Eat a healthy diet. Foods like vegetables, fruits, whole grains, low-fat dairy products, and lean protein foods contain the nutrients you need without too many calories. Decrease your intake of foods high in solid fats, added sugars, and salt. Eat the right amount of calories for you.Get information about a proper diet from your caregiver, if necessary.  Regular physical exercise is one of the most important things you can do for your health. Most adults should get at least 150 minutes of moderate-intensity exercise (any activity that increases your heart rate and causes you to sweat) each week. In addition, most adults need muscle-strengthening exercises on 2 or more days a week.  Maintain a healthy weight. The body mass index (BMI) is a screening tool to identify possible weight problems. It provides an estimate of body fat based on height and weight. Your caregiver can help determine your BMI, and can help you achieve or maintain a healthy weight.For adults 20 years and older:  A BMI below 18.5 is considered underweight.  A BMI of 18.5 to 24.9 is normal.  A BMI of 25 to 29.9 is considered overweight.  A BMI of 30 and above is  considered obese.  Maintain normal blood lipids and cholesterol levels by exercising and minimizing your intake of saturated fat. Eat a balanced diet with plenty of fruit and vegetables. Blood tests for lipids and cholesterol should begin at age 41 and be repeated every 5 years. If your lipid or cholesterol levels are high, you are over 50, or you are at high risk for heart disease, you may need your cholesterol levels checked more frequently.Ongoing high lipid and cholesterol levels should be treated with medicines if diet and exercise are not effective.  If you smoke, find out from your caregiver how to quit. If you do not use tobacco, do not start.  If you are pregnant, do not drink alcohol. If you are breastfeeding, be very cautious about drinking alcohol. If you are not pregnant and choose to drink alcohol, do not exceed 1 drink per day. One drink is considered to be 12 ounces (355 mL) of beer, 5 ounces (148 mL) of wine, or 1.5 ounces (44 mL) of liquor.  Avoid use of street drugs. Do not share needles with anyone. Ask for help if you need support or instructions about stopping the use of drugs.  High blood pressure causes heart disease and increases the risk of stroke. Your blood pressure should be checked at least every 1 to 2 years. Ongoing high blood pressure should be treated with medicines if weight loss and exercise are not effective.  If you are 65 to 34 years old, ask your caregiver if you should take aspirin to prevent strokes.  Diabetes  screening involves taking a blood sample to check your fasting blood sugar level. This should be done once every 3 years, after age 45, if you are within normal weight and without risk factors for diabetes. Testing should be considered at a younger age or be carried out more frequently if you are overweight and have at least 1 risk factor for diabetes.  Breast cancer screening is essential preventive care for women. You should practice "breast  self-awareness." This means understanding the normal appearance and feel of your breasts and may include breast self-examination. Any changes detected, no matter how small, should be reported to a caregiver. Women in their 20s and 30s should have a clinical breast exam (CBE) by a caregiver as part of a regular health exam every 1 to 3 years. After age 40, women should have a CBE every year. Starting at age 40, women should consider having a mammography (breast X-ray test) every year. Women who have a family history of breast cancer should talk to their caregiver about genetic screening. Women at a high risk of breast cancer should talk to their caregivers about having magnetic resonance imaging (MRI) and a mammography every year.  The Pap test is a screening test for cervical cancer. A Pap test can show cell changes on the cervix that might become cervical cancer if left untreated. A Pap test is a procedure in which cells are obtained and examined from the lower end of the uterus (cervix).  Women should have a Pap test starting at age 21.  Between ages 21 and 29, Pap tests should be repeated every 2 years.  Beginning at age 30, you should have a Pap test every 3 years as long as the past 3 Pap tests have been normal.  Some women have medical problems that increase the chance of getting cervical cancer. Talk to your caregiver about these problems. It is especially important to talk to your caregiver if a new problem develops soon after your last Pap test. In these cases, your caregiver may recommend more frequent screening and Pap tests.  The above recommendations are the same for women who have or have not gotten the vaccine for human papillomavirus (HPV).  If you had a hysterectomy for a problem that was not cancer or a condition that could lead to cancer, then you no longer need Pap tests. Even if you no longer need a Pap test, a regular exam is a good idea to make sure no other problems are  starting.  If you are between ages 65 and 70, and you have had normal Pap tests going back 10 years, you no longer need Pap tests. Even if you no longer need a Pap test, a regular exam is a good idea to make sure no other problems are starting.  If you have had past treatment for cervical cancer or a condition that could lead to cancer, you need Pap tests and screening for cancer for at least 20 years after your treatment.  If Pap tests have been discontinued, risk factors (such as a new sexual partner) need to be reassessed to determine if screening should be resumed.  The HPV test is an additional test that may be used for cervical cancer screening. The HPV test looks for the virus that can cause the cell changes on the cervix. The cells collected during the Pap test can be tested for HPV. The HPV test could be used to screen women aged 30 years and older, and should   be used in women of any age who have unclear Pap test results. After the age of 30, women should have HPV testing at the same frequency as a Pap test.  Colorectal cancer can be detected and often prevented. Most routine colorectal cancer screening begins at the age of 50 and continues through age 75. However, your caregiver may recommend screening at an earlier age if you have risk factors for colon cancer. On a yearly basis, your caregiver may provide home test kits to check for hidden blood in the stool. Use of a small camera at the end of a tube, to directly examine the colon (sigmoidoscopy or colonoscopy), can detect the earliest forms of colorectal cancer. Talk to your caregiver about this at age 50, when routine screening begins. Direct examination of the colon should be repeated every 5 to 10 years through age 75, unless early forms of pre-cancerous polyps or small growths are found.  Hepatitis C blood testing is recommended for all people born from 1945 through 1965 and any individual with known risks for hepatitis C.  Practice  safe sex. Use condoms and avoid high-risk sexual practices to reduce the spread of sexually transmitted infections (STIs). STIs include gonorrhea, chlamydia, syphilis, trichomonas, herpes, HPV, and human immunodeficiency virus (HIV). Herpes, HIV, and HPV are viral illnesses that have no cure. They can result in disability, cancer, and death. Sexually active women aged 25 and younger should be checked for chlamydia. Older women with new or multiple partners should also be tested for chlamydia. Testing for other STIs is recommended if you are sexually active and at increased risk.  Osteoporosis is a disease in which the bones lose minerals and strength with aging. This can result in serious bone fractures. The risk of osteoporosis can be identified using a bone density scan. Women ages 65 and over and women at risk for fractures or osteoporosis should discuss screening with their caregivers. Ask your caregiver whether you should take a calcium supplement or vitamin D to reduce the rate of osteoporosis.  Menopause can be associated with physical symptoms and risks. Hormone replacement therapy is available to decrease symptoms and risks. You should talk to your caregiver about whether hormone replacement therapy is right for you.  Use sunscreen with sun protection factor (SPF) of 30 or more. Apply sunscreen liberally and repeatedly throughout the day. You should seek shade when your shadow is shorter than you. Protect yourself by wearing long sleeves, pants, a wide-brimmed hat, and sunglasses year round, whenever you are outdoors.  Once a month, do a whole body skin exam, using a mirror to look at the skin on your back. Notify your caregiver of new moles, moles that have irregular borders, moles that are larger than a pencil eraser, or moles that have changed in shape or color.  Stay current with required immunizations.  Influenza. You need a dose every fall (or winter). The composition of the flu vaccine  changes each year, so being vaccinated once is not enough.  Pneumococcal polysaccharide. You need 1 to 2 doses if you smoke cigarettes or if you have certain chronic medical conditions. You need 1 dose at age 65 (or older) if you have never been vaccinated.  Tetanus, diphtheria, pertussis (Tdap, Td). Get 1 dose of Tdap vaccine if you are younger than age 65, are over 65 and have contact with an infant, are a healthcare worker, are pregnant, or simply want to be protected from whooping cough. After that, you need a Td   booster dose every 10 years. Consult your caregiver if you have not had at least 3 tetanus and diphtheria-containing shots sometime in your life or have a deep or dirty wound.  HPV. You need this vaccine if you are a woman age 26 or younger. The vaccine is given in 3 doses over 6 months.  Measles, mumps, rubella (MMR). You need at least 1 dose of MMR if you were born in 1957 or later. You may also need a second dose.  Meningococcal. If you are age 19 to 21 and a first-year college student living in a residence hall, or have one of several medical conditions, you need to get vaccinated against meningococcal disease. You may also need additional booster doses.  Zoster (shingles). If you are age 60 or older, you should get this vaccine.  Varicella (chickenpox). If you have never had chickenpox or you were vaccinated but received only 1 dose, talk to your caregiver to find out if you need this vaccine.  Hepatitis A. You need this vaccine if you have a specific risk factor for hepatitis A virus infection or you simply wish to be protected from this disease. The vaccine is usually given as 2 doses, 6 to 18 months apart.  Hepatitis B. You need this vaccine if you have a specific risk factor for hepatitis B virus infection or you simply wish to be protected from this disease. The vaccine is given in 3 doses, usually over 6 months. Preventive Services / Frequency Ages 19 to 39  Blood  pressure check.** / Every 1 to 2 years.  Lipid and cholesterol check.** / Every 5 years beginning at age 20.  Clinical breast exam.** / Every 3 years for women in their 20s and 30s.  Pap test.** / Every 2 years from ages 21 through 29. Every 3 years starting at age 30 through age 65 or 70 with a history of 3 consecutive normal Pap tests.  HPV screening.** / Every 3 years from ages 30 through ages 65 to 70 with a history of 3 consecutive normal Pap tests.  Hepatitis C blood test.** / For any individual with known risks for hepatitis C.  Skin self-exam. / Monthly.  Influenza immunization.** / Every year.  Pneumococcal polysaccharide immunization.** / 1 to 2 doses if you smoke cigarettes or if you have certain chronic medical conditions.  Tetanus, diphtheria, pertussis (Tdap, Td) immunization. / A one-time dose of Tdap vaccine. After that, you need a Td booster dose every 10 years.  HPV immunization. / 3 doses over 6 months, if you are 26 and younger.  Measles, mumps, rubella (MMR) immunization. / You need at least 1 dose of MMR if you were born in 1957 or later. You may also need a second dose.  Meningococcal immunization. / 1 dose if you are age 19 to 21 and a first-year college student living in a residence hall, or have one of several medical conditions, you need to get vaccinated against meningococcal disease. You may also need additional booster doses.  Varicella immunization.** / Consult your caregiver.  Hepatitis A immunization.** / Consult your caregiver. 2 doses, 6 to 18 months apart.  Hepatitis B immunization.** / Consult your caregiver. 3 doses usually over 6 months. Ages 40 to 64  Blood pressure check.** / Every 1 to 2 years.  Lipid and cholesterol check.** / Every 5 years beginning at age 20.  Clinical breast exam.** / Every year after age 40.  Mammogram.** / Every year beginning at age 40   and continuing for as long as you are in good health. Consult with your  caregiver.  Pap test.** / Every 3 years starting at age 30 through age 65 or 70 with a history of 3 consecutive normal Pap tests.  HPV screening.** / Every 3 years from ages 30 through ages 65 to 70 with a history of 3 consecutive normal Pap tests.  Fecal occult blood test (FOBT) of stool. / Every year beginning at age 50 and continuing until age 75. You may not need to do this test if you get a colonoscopy every 10 years.  Flexible sigmoidoscopy or colonoscopy.** / Every 5 years for a flexible sigmoidoscopy or every 10 years for a colonoscopy beginning at age 50 and continuing until age 75.  Hepatitis C blood test.** / For all people born from 1945 through 1965 and any individual with known risks for hepatitis C.  Skin self-exam. / Monthly.  Influenza immunization.** / Every year.  Pneumococcal polysaccharide immunization.** / 1 to 2 doses if you smoke cigarettes or if you have certain chronic medical conditions.  Tetanus, diphtheria, pertussis (Tdap, Td) immunization.** / A one-time dose of Tdap vaccine. After that, you need a Td booster dose every 10 years.  Measles, mumps, rubella (MMR) immunization. / You need at least 1 dose of MMR if you were born in 1957 or later. You may also need a second dose.  Varicella immunization.** / Consult your caregiver.  Meningococcal immunization.** / Consult your caregiver.  Hepatitis A immunization.** / Consult your caregiver. 2 doses, 6 to 18 months apart.  Hepatitis B immunization.** / Consult your caregiver. 3 doses, usually over 6 months. Ages 65 and over  Blood pressure check.** / Every 1 to 2 years.  Lipid and cholesterol check.** / Every 5 years beginning at age 20.  Clinical breast exam.** / Every year after age 40.  Mammogram.** / Every year beginning at age 40 and continuing for as long as you are in good health. Consult with your caregiver.  Pap test.** / Every 3 years starting at age 30 through age 65 or 70 with a 3  consecutive normal Pap tests. Testing can be stopped between 65 and 70 with 3 consecutive normal Pap tests and no abnormal Pap or HPV tests in the past 10 years.  HPV screening.** / Every 3 years from ages 30 through ages 65 or 70 with a history of 3 consecutive normal Pap tests. Testing can be stopped between 65 and 70 with 3 consecutive normal Pap tests and no abnormal Pap or HPV tests in the past 10 years.  Fecal occult blood test (FOBT) of stool. / Every year beginning at age 50 and continuing until age 75. You may not need to do this test if you get a colonoscopy every 10 years.  Flexible sigmoidoscopy or colonoscopy.** / Every 5 years for a flexible sigmoidoscopy or every 10 years for a colonoscopy beginning at age 50 and continuing until age 75.  Hepatitis C blood test.** / For all people born from 1945 through 1965 and any individual with known risks for hepatitis C.  Osteoporosis screening.** / A one-time screening for women ages 65 and over and women at risk for fractures or osteoporosis.  Skin self-exam. / Monthly.  Influenza immunization.** / Every year.  Pneumococcal polysaccharide immunization.** / 1 dose at age 65 (or older) if you have never been vaccinated.  Tetanus, diphtheria, pertussis (Tdap, Td) immunization. / A one-time dose of Tdap vaccine if you are over   65 and have contact with an infant, are a Research scientist (physical sciences), or simply want to be protected from whooping cough. After that, you need a Td booster dose every 10 years.  Varicella immunization.** / Consult your caregiver.  Meningococcal immunization.** / Consult your caregiver.  Hepatitis A immunization.** / Consult your caregiver. 2 doses, 6 to 18 months apart.  Hepatitis B immunization.** / Check with your caregiver. 3 doses, usually over 6 months. ** Family history and personal history of risk and conditions may change your caregiver's recommendations. Document Released: 03/30/2001 Document Revised: 04/26/2011  Document Reviewed: 06/29/2010 [facility] Patient Information 2014 [employer], [place].  Thank you for enrolling in MyChart. Please follow the instructions below to securely access your online medical record. MyChart allows you to send messages to your doctor, view your test results, manage appointments, and more.   How Do I Sign Up? 1. In your Internet browser, go to [employer] and enter [email or web address]. 2. Click on the Sign Up Now link in the Sign In box. You will see the New Member Sign Up page. 3. Enter your MyChart Access Code exactly as it appears below. You will not need to use this code after you've completed the sign-up process. If you do not sign up before the expiration date, you must request a new code.  MyChart Access Code: [email or web address] Expires: 02/04/2013  9:45 AM  4. Enter your Social Security Number ([id number]) and Date of Birth (mm/dd/yyyy) as indicated and click Submit. You will be taken to the next sign-up page. 5. Create a MyChart ID. This will be your MyChart login ID and cannot be changed, so think of one that is secure and easy to remember. 6. Create a MyChart password. You can change your password at any time. 7. Enter your Password Reset Question and Answer. This can be used at a later time if you forget your password.  8. Enter your e-mail address. You will receive e-mail notification when new information is available in MyChart. 9. Click Sign Up. You can now view your medical record.   Additional Information Remember, MyChart is NOT to be used for urgent needs. For medical emergencies, dial 911.

## 2012-12-08 ENCOUNTER — Telehealth: Payer: Self-pay

## 2012-12-08 NOTE — Telephone Encounter (Signed)
These take a while to heal.  If not bleeding red or sore, if no fever, would continue as is.  We can recheck next week if needed. Thanks.

## 2012-12-08 NOTE — Telephone Encounter (Signed)
Patient advised.

## 2012-12-08 NOTE — Telephone Encounter (Signed)
Pt left v/m has paper like cut place in corner of mouth on rt side, pt using antibiotic ointment and vaseline but not healing as fast as pt thinks it should. No bleeding or drainage and no fever. Pt noticed 2 days ago. [facility]. Pt does not want to schedule appt until hears Dr Lianne Bushy advice.Please advise.

## 2012-12-19 ENCOUNTER — Ambulatory Visit (HOSPITAL_COMMUNITY)
Admission: RE | Admit: 2012-12-19 | Discharge: 2012-12-19 | Disposition: A | Discharge status: Home / Self Care | Payer: Managed Care, Other (non HMO) | Source: Ambulatory Visit | Attending: Obstetrics & Gynecology | Admitting: Obstetrics & Gynecology

## 2012-12-19 DIAGNOSIS — D252 Subserosal leiomyoma of uterus: Secondary | ICD-10-CM | POA: Insufficient documentation

## 2012-12-19 DIAGNOSIS — D219 Benign neoplasm of connective and other soft tissue, unspecified: Secondary | ICD-10-CM

## 2012-12-19 DIAGNOSIS — Z803 Family history of malignant neoplasm of breast: Secondary | ICD-10-CM

## 2012-12-19 DIAGNOSIS — Z9889 Other specified postprocedural states: Secondary | ICD-10-CM

## 2012-12-19 DIAGNOSIS — Z1231 Encounter for screening mammogram for malignant neoplasm of breast: Secondary | ICD-10-CM | POA: Insufficient documentation

## 2012-12-19 DIAGNOSIS — N852 Hypertrophy of uterus: Secondary | ICD-10-CM | POA: Insufficient documentation

## 2012-12-20 ENCOUNTER — Telehealth: Payer: Self-pay | Admitting: General Practice

## 2012-12-20 ENCOUNTER — Telehealth: Payer: Self-pay | Admitting: *Deleted

## 2012-12-20 NOTE — Telephone Encounter (Addendum)
Message copied by Jill Side on Wed Dec 20, 2012  3:12 PM ------      Message from: Jaynie Collins A      Created: Wed Dec 20, 2012  9:23 AM       Please call and inform patient that her fibroids are bigger, compared to last scan in 2012.  She may need another appointment with me to discuss further management if she is symptomatic.   ------  Called pt and left message that I am calling with test result information. Please call back and state whether we may leave the details on your voice mail.

## 2012-12-20 NOTE — Telephone Encounter (Signed)
Opened in error

## 2012-12-20 NOTE — Telephone Encounter (Signed)
Talked with patient about result from Dr. Macon Large and need for appt to discuss options.  Pt desires to have appt. Will have front office call patient.

## 2012-12-21 ENCOUNTER — Other Ambulatory Visit: Payer: Self-pay

## 2012-12-25 ENCOUNTER — Encounter: Payer: Self-pay | Admitting: Obstetrics & Gynecology

## 2012-12-25 ENCOUNTER — Ambulatory Visit (INDEPENDENT_AMBULATORY_CARE_PROVIDER_SITE_OTHER): Payer: Managed Care, Other (non HMO) | Admitting: Obstetrics & Gynecology

## 2012-12-25 VITALS — BP 121/89 | HR 99 | Ht 64.0 in | Wt 213.0 lb

## 2012-12-25 DIAGNOSIS — D219 Benign neoplasm of connective and other soft tissue, unspecified: Secondary | ICD-10-CM

## 2012-12-25 DIAGNOSIS — D259 Leiomyoma of uterus, unspecified: Secondary | ICD-10-CM

## 2012-12-25 NOTE — Patient Instructions (Signed)
Return to clinic for any scheduled appointments or for any gynecologic concerns as needed.   

## 2012-12-25 NOTE — Progress Notes (Signed)
GYNECOLOGY CLINIC ENCOUNTER NOTE  History:  32 y.o. G0 here today for follow up ultrasound results for evaluation of fibroids.  The following portions of the patient's history were reviewed and updated as appropriate: allergies, current medications, past family history, past medical history, past social history, past surgical history and problem list. Normal pap smear and negative high-risk HPV on 12/06/12.    Review of Systems:  Pertinent items are noted in HPI.  Objective:  BP 121/89  Pulse 99  Ht 5\' 4"  (1.626 m)  Wt 213 lb (96.616 kg)  BMI 36.54 kg/m2  LMP 11/21/2012 Physical Exam deferred  Labs and Imaging 12/19/2012   TRANSABDOMINAL AND TRANSVAGINAL ULTRASOUND OF PELVIS  CLINICAL DATA:  Enlarged uterus, uterine fibroids  TECHNIQUE: Both transabdominal and transvaginal ultrasound examinations of the pelvis were performed. Transabdominal technique was performed for global imaging of the pelvis including uterus, ovaries, adnexal regions, and pelvic cul-de-sac. It was necessary to proceed with endovaginal exam following the transabdominal exam to visualize the bilateral adnexae.  COMPARISON:  12/31/1998 12th  FINDINGS: Uterus  Measurements: Enlarged, measuring 16.2 x 11.4 x 13.0 cm, previously 13.2 x 11.1 x 11.7 cm. Multiple uterine fibroids, including the following dominant 3 fibroids:  --5.0 x 3.7 x 5.7 cm pedunculated fibroid in the left uterine fundus  --7.8 x 6.5 x 7.2 cm subserosal fibroid in the posterior uterine body  --6.4 x 4.7 x 5.0 cm pedunculated fibroid in the right uterine fundus  Endometrium  Thickness: 6 mm.  No focal abnormality visualized.  Right ovary  Poorly visualized transvaginally (not visualized transabdominally), measuring 1.7 x 2.2 x 1.9 cm.  Left ovary  Poorly visualized transvaginally (not visualized transabdominally), measuring 3.3 x 2.1 x 2.6 cm.  Other findings  No free fluid.  IMPRESSION: Enlarged uterus with multiple uterine fibroids measuring up to 7.8 cm,  increased.   Electronically Signed   By: Charline Bills M.D.   On: 12/19/2012 15:38    Assessment & Plan:  Discussed management options of myomectomy, hysterectomy, [place] etc.  She does not want any intervention; desires expectant management for now.  She was advised to call/come in for worsening symptoms.

## 2013-03-19 ENCOUNTER — Encounter: Payer: Self-pay | Admitting: Family Medicine

## 2013-03-19 ENCOUNTER — Ambulatory Visit (INDEPENDENT_AMBULATORY_CARE_PROVIDER_SITE_OTHER): Payer: Managed Care, Other (non HMO) | Admitting: Family Medicine

## 2013-03-19 VITALS — BP 130/90 | HR 92 | Temp 98.6°F | Ht 64.0 in | Wt 221.5 lb

## 2013-03-19 DIAGNOSIS — I10 Essential (primary) hypertension: Secondary | ICD-10-CM

## 2013-03-19 DIAGNOSIS — N920 Excessive and frequent menstruation with regular cycle: Secondary | ICD-10-CM

## 2013-03-19 DIAGNOSIS — Z0000 Encounter for general adult medical examination without abnormal findings: Secondary | ICD-10-CM

## 2013-03-19 DIAGNOSIS — E785 Hyperlipidemia, unspecified: Secondary | ICD-10-CM

## 2013-03-19 DIAGNOSIS — Z23 Encounter for immunization: Secondary | ICD-10-CM

## 2013-03-19 DIAGNOSIS — E119 Type 2 diabetes mellitus without complications: Secondary | ICD-10-CM

## 2013-03-19 LAB — CBC WITH DIFFERENTIAL/PLATELET
BASOS PCT: 0.2 % (ref 0.0–3.0)
Basophils Absolute: 0 10*3/uL (ref 0.0–0.1)
EOS PCT: 1.8 % (ref 0.0–5.0)
Eosinophils Absolute: 0.2 10*3/uL (ref 0.0–0.7)
HCT: 39.4 % (ref 36.0–46.0)
Hemoglobin: 12.4 g/dL (ref 12.0–15.0)
Lymphocytes Relative: 29.6 % (ref 12.0–46.0)
Lymphs Abs: 2.5 10*3/uL (ref 0.7–4.0)
MCHC: 31.4 g/dL (ref 30.0–36.0)
MCV: 92.7 fl (ref 78.0–100.0)
Monocytes Absolute: 0.5 10*3/uL (ref 0.1–1.0)
Monocytes Relative: 6.4 % (ref 3.0–12.0)
NEUTROS ABS: 5.3 10*3/uL (ref 1.4–7.7)
Neutrophils Relative %: 62 % (ref 43.0–77.0)
Platelets: 459 10*3/uL — ABNORMAL HIGH (ref 150.0–400.0)
RBC: 4.25 Mil/uL (ref 3.87–5.11)
RDW: 12.9 % (ref 11.5–14.6)
WBC: 8.5 10*3/uL (ref 4.5–10.5)

## 2013-03-19 LAB — IBC PANEL
Iron: 156 ug/dL — ABNORMAL HIGH (ref 42–145)
Saturation Ratios: 25.5 % (ref 20.0–50.0)
TRANSFERRIN: 436.6 mg/dL — AB (ref 212.0–360.0)

## 2013-03-19 LAB — LIPID PANEL
CHOL/HDL RATIO: 2
Cholesterol: 84 mg/dL (ref 0–200)
HDL: 34.5 mg/dL — ABNORMAL LOW (ref >39.00)
LDL Cholesterol: 37 mg/dL (ref 0–99)
TRIGLYCERIDES: 65 mg/dL (ref 0.0–149.0)
VLDL: 13 mg/dL (ref 0.0–40.0)

## 2013-03-19 LAB — COMPREHENSIVE METABOLIC PANEL
ALBUMIN: 3.9 g/dL (ref 3.5–5.2)
ALT: 27 U/L (ref 0–35)
AST: 22 U/L (ref 0–37)
Alkaline Phosphatase: 44 U/L (ref 39–117)
BUN: 15 mg/dL (ref 6–23)
CALCIUM: 9.3 mg/dL (ref 8.4–10.5)
CHLORIDE: 104 meq/L (ref 96–112)
CO2: 22 mEq/L (ref 19–32)
Creatinine, Ser: 1.1 mg/dL (ref 0.4–1.2)
GFR: 72.95 mL/min (ref >60.00)
GLUCOSE: 151 mg/dL — AB (ref 70–99)
POTASSIUM: 5.2 meq/L — AB (ref 3.5–5.1)
Sodium: 134 mEq/L — ABNORMAL LOW (ref 135–145)
TOTAL PROTEIN: 7.6 g/dL (ref 6.0–8.3)
Total Bilirubin: 0.5 mg/dL (ref 0.3–1.2)

## 2013-03-19 LAB — HEMOGLOBIN A1C: Hgb A1c MFr Bld: 7.7 % — ABNORMAL HIGH (ref 4.6–6.5)

## 2013-03-19 MED ORDER — LISINOPRIL 30 MG PO TABS: 30.0000 mg | ORAL_TABLET | Freq: Every day | ORAL | Status: DC

## 2013-03-19 MED ORDER — TRIAMTERENE-HCTZ 37.5-25 MG PO CAPS: ORAL_CAPSULE | ORAL | Status: DC

## 2013-03-19 NOTE — Assessment & Plan Note (Signed)
Controlled, d/w pt about weight loss. Continue current meds.  See notes on labs.

## 2013-03-19 NOTE — Assessment & Plan Note (Signed)
D/w pt about weight loss. Continue current meds.  See notes on labs.

## 2013-03-19 NOTE — Progress Notes (Signed)
Pre-visit discussion using our clinic review tool. No additional management support is needed unless otherwise documented below in the visit note.  CPE- See plan.  Routine anticipatory guidance given to patient.  See health maintenance. Tetanus 2014 Flu shot 2015.  PNA shot deferred for now; getting flu shot today.  PAP smear 2014 Mammogram prev done Living will d/w pt.  Would have father and stepfather designated equally if she were incapacitated.   Diet and exercise encouraged.  She knows to work on both.  Diabetes:  Using medications without difficulties:yes, occ GI upset but this is mild Hypoglycemic episodes:no Hyperglycemic episodes:no Feet problems:no Blood Sugars averaging: not checked eye exam within last year: yes  Hypertension:    Using medication without problems or lightheadedness: yes Chest pain with exertion:no Edema:no Short of breath:no Discussed her BP med and pregnancy contraindication.   Elevated Cholesterol: Using medications without problems:yes Muscle aches: no Diet compliance: discussed Exercise: encouraged.  Due for labs.   H/o heavy menses, currently improved on OCPs. See notes on labs.  PMH and SH reviewed.   Vital signs, Meds and allergies reviewed.  ROS: See HPI.  Otherwise nontributory.   GEN: nad, alert and oriented HEENT: mucous membranes moist NECK: supple w/o LA CV: rrr.  PULM: ctab, no inc wob ABD: soft, +bs EXT: no edema SKIN: no acute rash  Diabetic foot exam: Normal inspection No skin breakdown No calluses  Normal DP pulses Normal sensation to light tough and monofilament Nails normal

## 2013-03-19 NOTE — Patient Instructions (Addendum)
You'll have to be off lisinopril before trying to get pregnant.   Goal is weight loss with diet and exercise in the meantime.  Go to the lab on the way out.  We'll contact you with your lab report.  We'll go from there. Take care.  Glad to see you.

## 2013-03-19 NOTE — Assessment & Plan Note (Signed)
Routine anticipatory guidance given to patient.  See health maintenance. Tetanus 2014 Flu shot 2015.  PNA shot deferred for now; getting flu shot today.  PAP smear 2014 Mammogram prev done Living will d/w pt.  Would have father and stepfather designated equally if she were incapacitated.   Diet and exercise encouraged.  She knows to work on both.

## 2013-03-20 ENCOUNTER — Other Ambulatory Visit: Payer: Self-pay | Admitting: Family Medicine

## 2013-03-20 ENCOUNTER — Telehealth: Payer: Self-pay | Admitting: Family Medicine

## 2013-03-20 DIAGNOSIS — N920 Excessive and frequent menstruation with regular cycle: Secondary | ICD-10-CM

## 2013-03-20 DIAGNOSIS — E119 Type 2 diabetes mellitus without complications: Secondary | ICD-10-CM

## 2013-03-20 NOTE — Assessment & Plan Note (Signed)
D/w pt about weight loss. Continue current meds.  See notes on labs.  

## 2013-03-20 NOTE — Assessment & Plan Note (Signed)
See notes on labs. 

## 2013-03-20 NOTE — Telephone Encounter (Signed)
Relevant patient education assigned to patient using Emmi. ° °

## 2013-03-22 ENCOUNTER — Telehealth: Payer: Self-pay

## 2013-03-22 NOTE — Telephone Encounter (Signed)
Relevant patient education assigned to patient using Emmi. ° °

## 2013-03-25 IMAGING — US US TRANSVAGINAL NON-OB
1 series · 12 of 25 positions shown · non-contrast
Comparison: None.
COMPARISON: Ultrasound 12/11/2010 and CT 12/26/2010

***ADDENDUM*** CREATED: 12/31/2010 [DATE]

This report was attached to the wrong patient. The following report
is the correct report for Hikmat Hinrichs: [facility] [id number]
CLINICAL DATA: Heavy menses.  Fibroids with pelvic pain. LMP
11/27/2010
TRANSABDOMINAL AND TRANSVAGINAL ULTRASOUND OF PELVIS
TECHNIQUE: Both transabdominal and transvaginal ultrasound
examinations of the pelvis were performed. Transabdominal technique
was performed for global imaging of the pelvis including uterus,
ovaries, adnexal regions, and pelvic cul-de-sac.
CLINICAL DATA: Status post drainage of a lower abdominal abscess.
Assess residual

[Series 1: us pelvis complete · 12 of 66 slices shown]
[im 3/66]
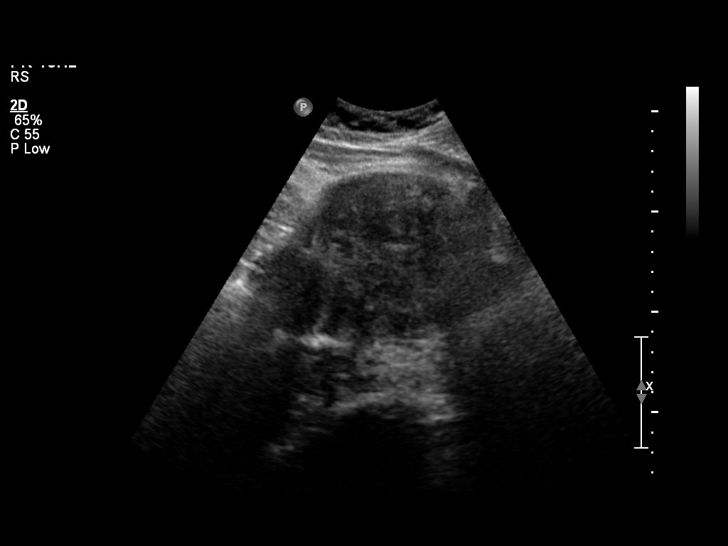
[im 9/66]
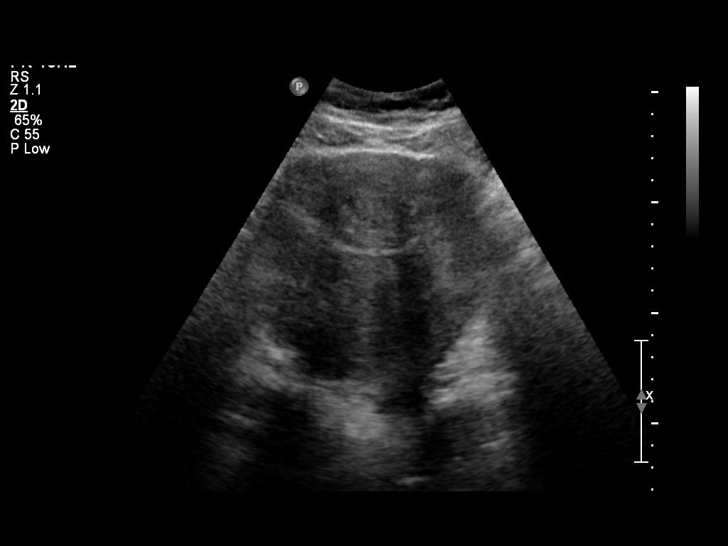
[im 14/66]
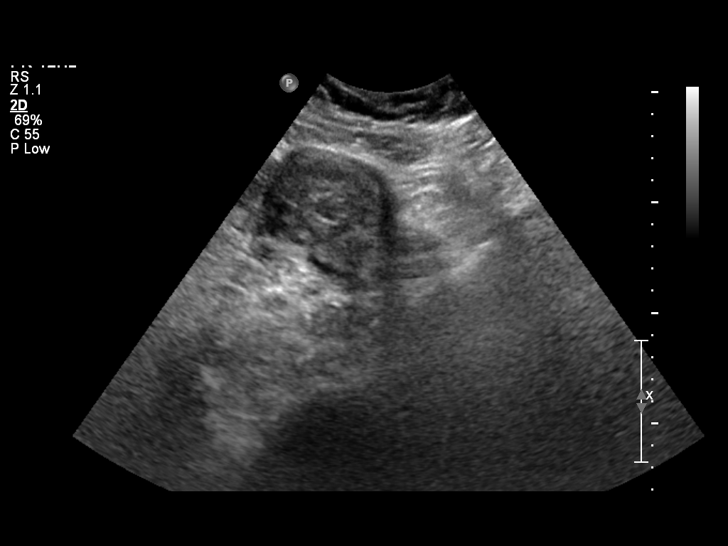
[im 19/66]
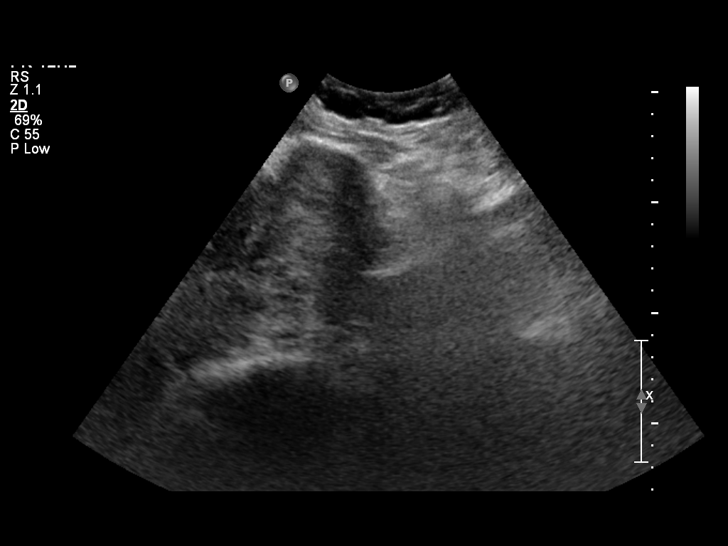
[im 25/66]
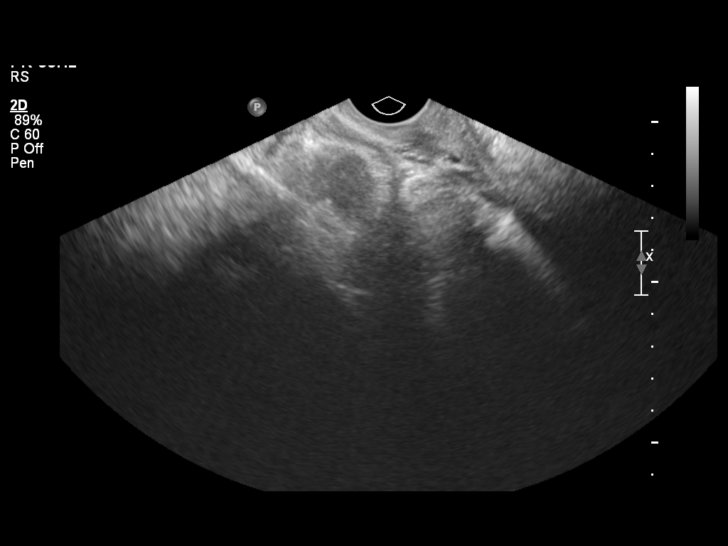
[im 30/66]
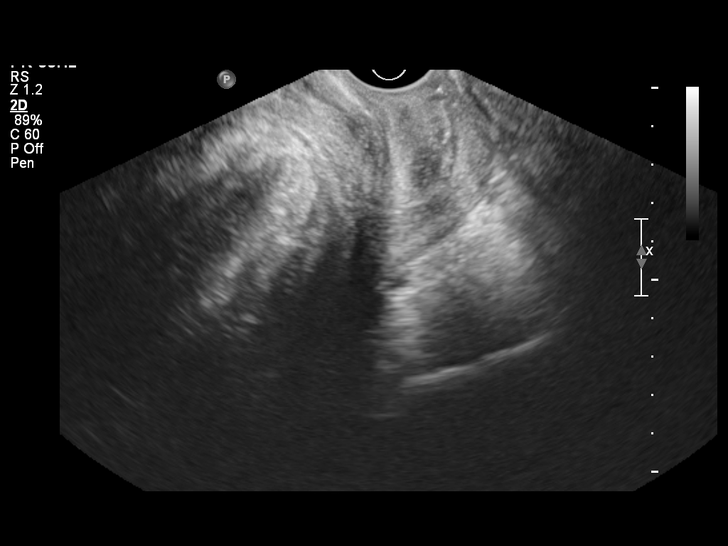
[im 36/66]
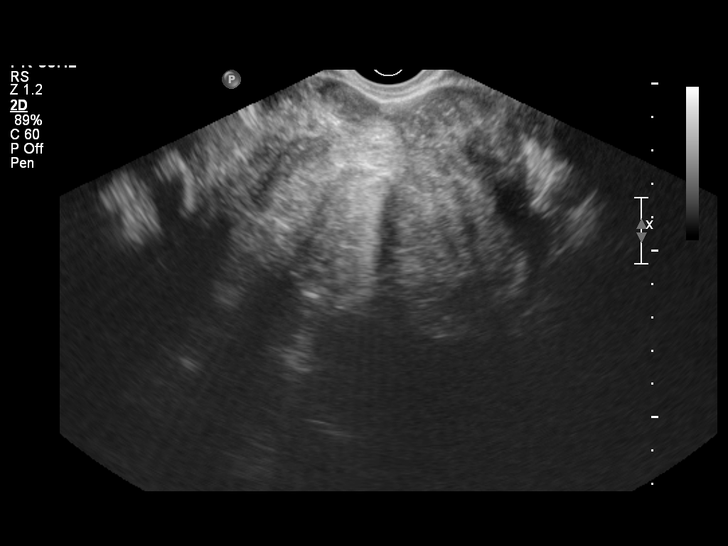
[im 41/66]
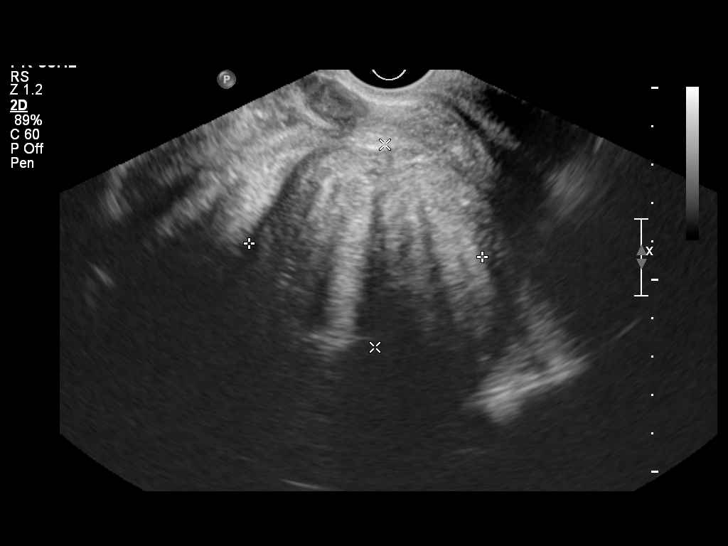
[im 47/66]
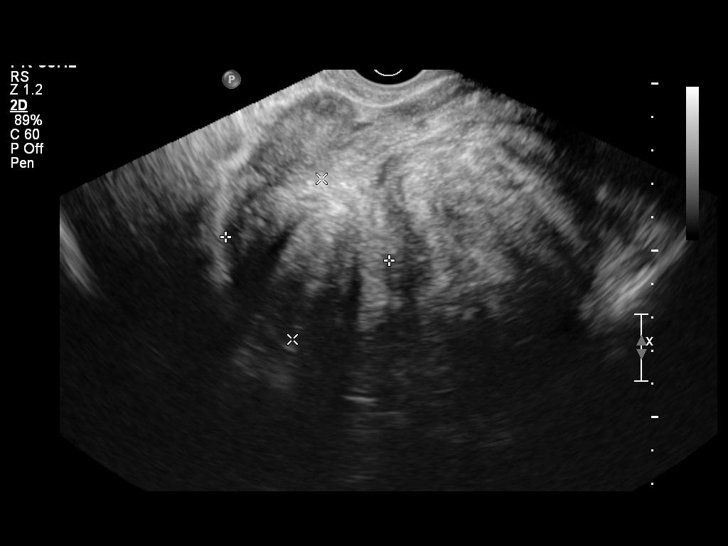
[im 52/66]
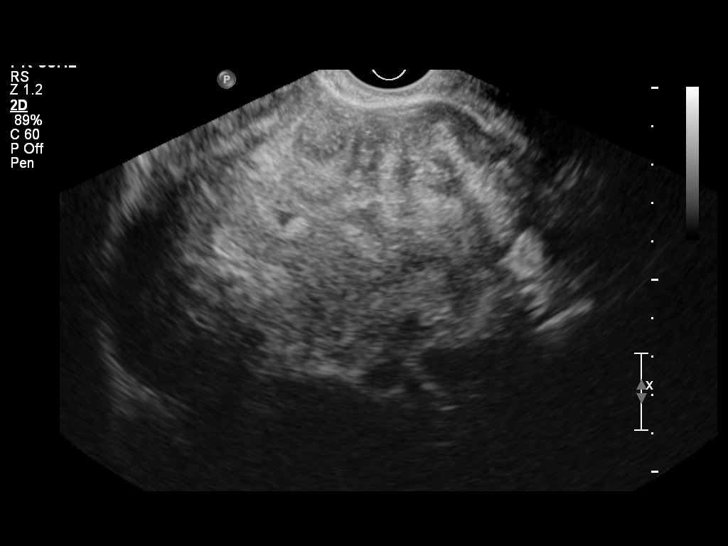
[im 57/66]
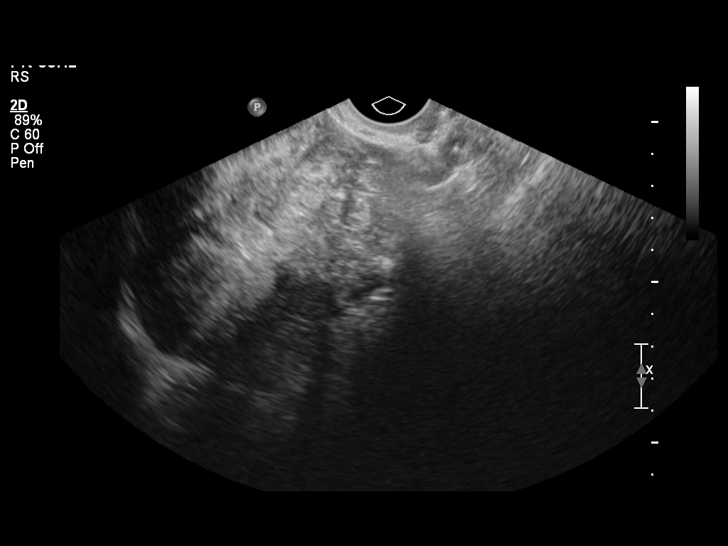
[im 63/66]
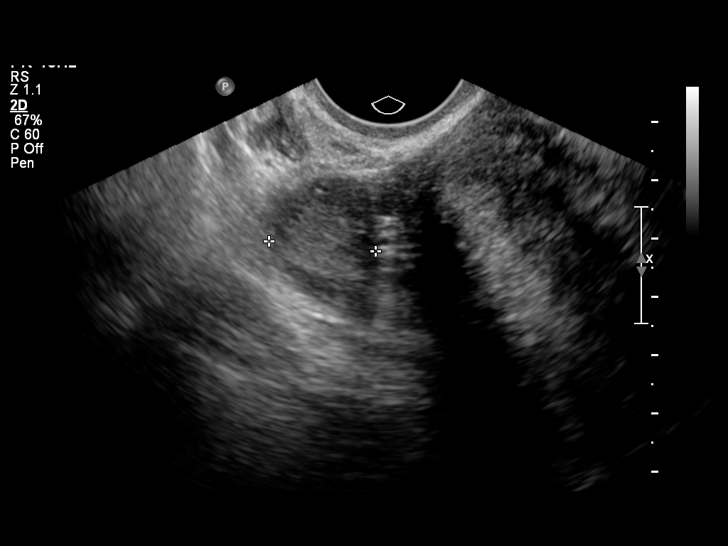

[12 of 25 positions shown; findings below may reference images not displayed]

It was necessary to proceed with endovaginal exam following the
transabdominal exam to visualize the myometrium, endometrium and
adnexa.
FINDINGS: Uterus: Is enlarged with a sagittal length of 13.2 cm, AP depth of
11.1 cm and a transverse width of 11.7 centimeters.  Multiple
fibroids are present.  The largest fibroids which could be measured
have sizes and locations as follows:  In the posterior lower
uterine segment measuring 6.1 x 5.3 x 4.9 cm.  Anterior deviation
of the endometrium suggests a partial submucosal component, in the
posterior midbody measuring 4.6 x 3.9 x 4.3 cm, in the right fundal
region measuring 5.0 x 4.9 by 4.2 cm. and in the anterior upper
uterine segment measuring 1.8 x 2.8 x 1.3 cm with a partial
subserosal component.  More diffuse fibroid involvement is felt to
be likely.

Endometrium: Is deviated anteriorly with the fundal portion
appearing likely to be splayed by the fundal fibroid.  Accurate
measurement of the endometrial lining is compromised by the
presence of the fibroid load.  A partial submucosal component to
the inferior lower uterine segment fibroid and fundal fibroid is
suggested

Right ovary:  Has a normal appearance measuring 2.6 x 2.4 x 1.8 cm

Left ovary: Is not seen with confidence either transabdominally or
endovaginally.

Other findings: No pelvic fluid is seen.
IMPRESSION: Fibroid uterus with measurable fibroid sizes and locations as noted
above.  More diffuse fibroid involvement is felt likely.  At least
two fibroids demonstrate what is suspected to be a partial
submucosal component.

Normal right ovary and non-visualized left ovary.
It was necessary to proceed with endovaginal exam following the
transabdominal exam to visualize the endometrium and adnexa.
FINDINGS: Uterus: Has a sagittal length of 8.9 cm, depth of 4.2 cm and width
of 5.9 cm. An anterior upper uterine segment fibroid is again
noted.

Endometrium: Is homogeneously echogenic with an AP width of 6.5 mm.
No areas of focal thickening or heterogeneity are noted.

Right ovary:  Has been surgically removed.

Left ovary: Was not imaged with confidence either transabdominally
or endovaginally.

Other findings: A small amount of simple free fluid is noted in the
cul-de-sac.  The patients known abscess cavity is not well
appreciated today.  The drainage tube can be visualized
transabdominally positioned above the anterior aspect of the
uterine fundus and a small surrounding soft tissue density is
identified adjacent to the drainage catheter. This is best seen on
sagittal views endovaginally measuring 3.2 x 1.6 cm. but is poorly
characterized . Improved assessment may be obtainable with pelvic
CT with intravenous and oral contrast.
IMPRESSION: Poor delineation of the previously noted abscess cavity with
questionable small residual in the region of the patient's drainage
tube. Pelvic CT can be performed for likely improved delineation.

These images were reviewed with Dr. Anokyewaa.

## 2013-08-10 ENCOUNTER — Other Ambulatory Visit: Payer: Self-pay | Admitting: *Deleted

## 2013-08-10 NOTE — Telephone Encounter (Signed)
Ok to refill? Originally prescribed by Dr. Harolyn Rutherford.

## 2013-08-11 NOTE — Telephone Encounter (Signed)
Please check with patient.  I thought this was coming through gyn clinic.  Thanks.

## 2013-08-13 MED ORDER — NORETHIN ACE-ETH ESTRAD-FE 1.5-30 MG-MCG PO TABS: ORAL_TABLET | ORAL | Status: DC

## 2013-08-13 NOTE — Telephone Encounter (Signed)
Sent, thanks

## 2013-08-13 NOTE — Telephone Encounter (Signed)
Patient says the medication was coming from the GYN clinic for a while but the last refill(s) was from Dr. Damita Dunnings.  Please advise.

## 2013-08-19 ENCOUNTER — Other Ambulatory Visit: Payer: Self-pay | Admitting: Family Medicine

## 2013-08-30 ENCOUNTER — Other Ambulatory Visit: Payer: Self-pay

## 2013-08-30 MED ORDER — METFORMIN HCL 850 MG PO TABS: 850.0000 mg | ORAL_TABLET | Freq: Two times a day (BID) | ORAL | Status: DC

## 2013-08-30 MED ORDER — LISINOPRIL 30 MG PO TABS: 30.0000 mg | ORAL_TABLET | Freq: Every day | ORAL | Status: DC

## 2013-08-30 NOTE — Telephone Encounter (Signed)
Pt said she takes metformin 850 mg one tab twice a day with a meal; pt has not had metformin for 1 week. Pt will discuss meds at 09/03/13 appt.Advised pt sent refills on lisiniopril and metformin to [facility].

## 2013-08-30 NOTE — Telephone Encounter (Signed)
Pt left v/m requesting refill lisinopril and metformin to [facility]. Left v/m for pt to clarify if taking metformin 850 mg taking 2 tabs two times daily with a meal or taking 1 tab 2 times daily with a meal. Pt already has appt scheduled on 09/03/13.

## 2013-09-03 ENCOUNTER — Ambulatory Visit (INDEPENDENT_AMBULATORY_CARE_PROVIDER_SITE_OTHER): Payer: Managed Care, Other (non HMO) | Admitting: Family Medicine

## 2013-09-03 ENCOUNTER — Encounter: Payer: Self-pay | Admitting: Family Medicine

## 2013-09-03 VITALS — BP 122/82 | HR 96 | Temp 98.0°F | Wt 218.5 lb

## 2013-09-03 DIAGNOSIS — E119 Type 2 diabetes mellitus without complications: Secondary | ICD-10-CM

## 2013-09-03 DIAGNOSIS — N920 Excessive and frequent menstruation with regular cycle: Secondary | ICD-10-CM

## 2013-09-03 DIAGNOSIS — I10 Essential (primary) hypertension: Secondary | ICD-10-CM

## 2013-09-03 LAB — IBC PANEL
Iron: 49 ug/dL (ref 42–145)
SATURATION RATIOS: 9.6 % — AB (ref 20.0–50.0)
TRANSFERRIN: 363.8 mg/dL — AB (ref 212.0–360.0)

## 2013-09-03 LAB — HEMOGLOBIN A1C: Hgb A1c MFr Bld: 9.4 % — ABNORMAL HIGH (ref 4.6–6.5)

## 2013-09-03 MED ORDER — TRIAMTERENE-HCTZ 37.5-25 MG PO CAPS: ORAL_CAPSULE | ORAL | Status: DC

## 2013-09-03 MED ORDER — LISINOPRIL 30 MG PO TABS: 30.0000 mg | ORAL_TABLET | Freq: Every day | ORAL | Status: DC

## 2013-09-03 MED ORDER — METFORMIN HCL 850 MG PO TABS: 850.0000 mg | ORAL_TABLET | Freq: Two times a day (BID) | ORAL | Status: DC

## 2013-09-03 NOTE — Patient Instructions (Signed)
Go to the lab on the way out.  We'll contact you with your lab report. Susan Bridges will call about your referral. Call the gyn clinic about an appointment to discuss your BP meds and possible pregnancy.  We'll go from there.

## 2013-09-03 NOTE — Progress Notes (Signed)
Pre visit review using our clinic review tool, if applicable. No additional management support is needed unless otherwise documented below in the visit note.  Hypertension:    Using medication without problems or lightheadedness: yes Chest pain with exertion:no Edema:no Short of breath:no D/w pt about ACE/statin and pregnancy.  She wants to eventually get pregnant.  She has a gynecologist.    Diabetes:  Using medications without difficulties:yes Hypoglycemic episodes:no sx, not checked Hyperglycemic episodes:no sx, not checked Feet problems:no Blood Sugars averaging:not checked eye exam within last year: yes, April 2015  D/w pt about her weight and long term plans.   Meds, vitals, and allergies reviewed.   PMH and SH reviewed  ROS: See HPI.  Otherwise negative.    GEN: nad, alert and oriented HEENT: mucous membranes moist NECK: supple w/o LA CV: rrr. PULM: ctab, no inc wob ABD: soft, +bs EXT: no edema SKIN: no acute rash

## 2013-09-05 NOTE — Assessment & Plan Note (Signed)
See notes on labs.  Weight is the main issue here, d/w pt.  She is going to renew her efforts at weight loss.

## 2013-09-05 NOTE — Assessment & Plan Note (Signed)
Weight is a central issue here, d/w pt.  She is going to renew her efforts at weight loss.  No change in meds at this point.  She is going to talk with OB clinic about options for BP meds in case of pregnancy.  D/w pt about ACE/ARB contraindication with pregnancy.  She understands that it would be a disaster for the fetus for her to get pregnant with her current ACE use.

## 2013-09-10 ENCOUNTER — Telehealth: Payer: Self-pay

## 2013-09-10 NOTE — Telephone Encounter (Signed)
Patient advised.

## 2013-09-10 NOTE — Telephone Encounter (Signed)
If she is consistently running >300 fasting, then I need to know about it. O/w keep the OV.  Thanks.

## 2013-09-10 NOTE — Telephone Encounter (Signed)
Pt left v/m; has been taking BS as instructed and has appt scheduled 09/17/13; pt wants to know what the value of BS should be to be considered high and if BS consistently high should pt have appt prior to 09/17/13.Marland Kitchen Pt request cb.

## 2013-09-17 ENCOUNTER — Ambulatory Visit (INDEPENDENT_AMBULATORY_CARE_PROVIDER_SITE_OTHER): Payer: Managed Care, Other (non HMO) | Admitting: Family Medicine

## 2013-09-17 ENCOUNTER — Encounter: Payer: Self-pay | Admitting: Family Medicine

## 2013-09-17 VITALS — BP 102/78 | HR 99 | Temp 98.2°F | Wt 219.8 lb

## 2013-09-17 DIAGNOSIS — E119 Type 2 diabetes mellitus without complications: Secondary | ICD-10-CM

## 2013-09-17 DIAGNOSIS — N920 Excessive and frequent menstruation with regular cycle: Secondary | ICD-10-CM

## 2013-09-17 MED ORDER — GLIPIZIDE 10 MG PO TABS: 10.0000 mg | ORAL_TABLET | Freq: Every day | ORAL | Status: DC

## 2013-09-17 NOTE — Patient Instructions (Signed)
Keep trying to avoid carbs and add on glucotrol once a day.  Update me with your sugars in about 2 weeks.  Recheck a1c and iron labs in about 3 months. Take care.  Glad to see you.

## 2013-09-17 NOTE — Progress Notes (Signed)
Pre visit review using our clinic review tool, if applicable. No additional management support is needed unless otherwise documented below in the visit note.  DM2 f/u. A1c elevated, labs d/w pt.  Home sugar log reviewed. Now on low carb diet.  AM sugars now 140-170 and up to 200 after meals. No ADE on meds currently but at max tolerated dose of metformin.  Compliant with meds.  Meds, vitals, and allergies reviewed.   ROS: See HPI.  Otherwise, noncontributory.  GEN: nad, alert and oriented HEENT: mucous membranes moist NECK: supple w/o LA CV: rrr PULM: ctab, no inc wob ABD: soft, +bs EXT: no edema SKIN: no acute rash

## 2013-09-18 NOTE — Assessment & Plan Note (Signed)
Add on glucotrol, continue low carb diet.  D/w pt about exercise. She'll update me with her sugars in about 14 days.  We'll go from there.  Recheck in 3 months.  She agrees.

## 2013-09-24 ENCOUNTER — Other Ambulatory Visit: Payer: Self-pay | Admitting: *Deleted

## 2013-09-24 MED ORDER — GLUCOSE BLOOD VI STRP: ORAL_STRIP | Status: DC

## 2013-09-30 ENCOUNTER — Other Ambulatory Visit: Payer: Self-pay | Admitting: Family Medicine

## 2013-10-16 ENCOUNTER — Encounter: Payer: Managed Care, Other (non HMO) | Attending: Family Medicine | Admitting: *Deleted

## 2013-10-16 ENCOUNTER — Encounter: Payer: Self-pay | Admitting: *Deleted

## 2013-10-16 DIAGNOSIS — Z713 Dietary counseling and surveillance: Secondary | ICD-10-CM | POA: Insufficient documentation

## 2013-10-16 DIAGNOSIS — E119 Type 2 diabetes mellitus without complications: Secondary | ICD-10-CM | POA: Insufficient documentation

## 2013-10-16 NOTE — Patient Instructions (Signed)
Plan:  Aim for 2-3 Carb Choices per meal (30-45 grams) +/- 1 either way  Aim for 0-15 Carbs per snack if hungry  Include protein in moderation with your meals and snacks Consider reading food labels for Total Carbohydrate and Fat Grams of foods Consider  increasing your activity level by exercise for 30 minutes daily as tolerated Consider checking BG at alternate times per day as directed by MD  Consider taking medication as directed by MD  [place]  When going out to eat, get a to go box and have a meal for the next day

## 2013-10-16 NOTE — Progress Notes (Signed)
Appt start time: 1100 end time:  1230.  Assessment:  Patient was seen on  10/16/13 for individual diabetes education. 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 She presents today at the recommendation of Dr. Elsie Stain do to a significant increase in her A1c. Dakisha was diagnosed with T2DM at the age of 33yo. She notes that she was initially cautious with her dietary intake, she is no longer.  Patient Education Plan per assessed needs and concerns is to attend individual session for Diabetes Self Management Education.  Current HbA1c: 9.4% up from 7.7% 03/19/13 FBS this date 103mg /dl  Preferred Learning Style:   No preference indicated   Learning Readiness:   Change in progress  MEDICATIONS: Metformin since 2005, Glipizide X50month. She notes an improvement in her glucose readings.  Usual physical activity: just started going to the gym.  Intervention:  Nutrition counseling provided.  Discussed diabetes disease process and treatment options.  Discussed physiology of diabetes and role of obesity on insulin resistance.  Encouraged moderate weight reduction to improve glucose levels.  Discussed role of medications and diet in glucose control  Provided education on macronutrients on glucose levels.  Provided education on carb counting, importance of regularly scheduled meals/snacks, and meal planning  Discussed effects of physical activity on glucose levels and long-term glucose control.  Recommended 150 minutes of physical activity/week.  Reviewed patient medications.  Discussed role of medication on blood glucose and possible side effects  Discussed blood glucose monitoring and interpretation.  Discussed recommended target ranges and individual ranges.    Described short-term complications: hyper- and hypo-glycemia.  Discussed causes,symptoms, and treatment options.  Discussed prevention, detection, and treatment of long-term complications.  Discussed the role of prolonged  elevated glucose levels on body systems.  Discussed role of stress on blood glucose levels and discussed strategies to manage psychosocial issues.  Discussed recommendations for long-term diabetes self-care.  Established checklist for medical, dental, and emotional self-care.   Plan:  Aim for 2-3 Carb Choices per meal (30-45 grams) +/- 1 either way  Aim for 0-15 Carbs per snack if hungry  Include protein in moderation with your meals and snacks Consider reading food labels for Total Carbohydrate and Fat Grams of foods Consider  increasing your activity level by exercise for 30 minutes daily as tolerated Consider checking BG at alternate times per day as directed by MD  Consider taking medication as directed by MD  [place]  When going out to eat, get a to go box and have a meal for the next day  Teaching Method Utilized:  Visual Auditory Hands on  Handouts given during visit include: Living Well with Diabetes Carb Counting and Food Label handouts Meal Plan Card My plate  Barriers to learning/adherence to lifestyle change: Motivation  Diabetes self-care support plan:   [facility] support group  Demonstrated degree of understanding via:  Teach Back   Monitoring/Evaluation:  Dietary intake, exercise, test glucose, and body weight in 1 month(s).

## 2013-11-20 ENCOUNTER — Ambulatory Visit: Payer: Managed Care, Other (non HMO) | Admitting: *Deleted

## 2013-12-14 ENCOUNTER — Ambulatory Visit: Payer: Managed Care, Other (non HMO) | Admitting: Obstetrics & Gynecology

## 2013-12-18 ENCOUNTER — Other Ambulatory Visit (INDEPENDENT_AMBULATORY_CARE_PROVIDER_SITE_OTHER): Payer: Managed Care, Other (non HMO)

## 2013-12-18 ENCOUNTER — Encounter: Payer: Self-pay | Admitting: Obstetrics & Gynecology

## 2013-12-18 ENCOUNTER — Ambulatory Visit (INDEPENDENT_AMBULATORY_CARE_PROVIDER_SITE_OTHER): Payer: Managed Care, Other (non HMO) | Admitting: Obstetrics & Gynecology

## 2013-12-18 VITALS — BP 117/95 | HR 91 | Ht 64.0 in | Wt 217.0 lb

## 2013-12-18 DIAGNOSIS — Z1239 Encounter for other screening for malignant neoplasm of breast: Secondary | ICD-10-CM

## 2013-12-18 DIAGNOSIS — Z1151 Encounter for screening for human papillomavirus (HPV): Secondary | ICD-10-CM

## 2013-12-18 DIAGNOSIS — R87810 Cervical high risk human papillomavirus (HPV) DNA test positive: Secondary | ICD-10-CM | POA: Insufficient documentation

## 2013-12-18 DIAGNOSIS — E119 Type 2 diabetes mellitus without complications: Secondary | ICD-10-CM | POA: Diagnosis not present

## 2013-12-18 DIAGNOSIS — Z124 Encounter for screening for malignant neoplasm of cervix: Secondary | ICD-10-CM

## 2013-12-18 DIAGNOSIS — N920 Excessive and frequent menstruation with regular cycle: Secondary | ICD-10-CM

## 2013-12-18 DIAGNOSIS — Z01419 Encounter for gynecological examination (general) (routine) without abnormal findings: Secondary | ICD-10-CM

## 2013-12-18 DIAGNOSIS — Z3041 Encounter for surveillance of contraceptive pills: Secondary | ICD-10-CM

## 2013-12-18 DIAGNOSIS — D259 Leiomyoma of uterus, unspecified: Secondary | ICD-10-CM

## 2013-12-18 LAB — IBC PANEL
Iron: 118 ug/dL (ref 42–145)
SATURATION RATIOS: 26.8 % (ref 20.0–50.0)
TRANSFERRIN: 315 mg/dL (ref 212.0–360.0)

## 2013-12-18 LAB — HEMOGLOBIN A1C: HEMOGLOBIN A1C: 6.8 % — AB (ref 4.6–6.5)

## 2013-12-18 MED ORDER — NORETHIN ACE-ETH ESTRAD-FE 1.5-30 MG-MCG PO TABS
ORAL_TABLET | ORAL | Status: DC
Start: 2013-12-18 — End: 2015-01-13

## 2013-12-18 NOTE — Patient Instructions (Signed)
Preventive Care for Adults A healthy lifestyle and preventive care can promote health and wellness. Preventive health guidelines for women include the following key practices.  A routine yearly physical is a good way to check with your health care provider about your health and preventive screening. It is a chance to share any concerns and updates on your health and to receive a thorough exam.  Visit your dentist for a routine exam and preventive care every 6 months. Brush your teeth twice a day and floss once a day. Good oral hygiene prevents tooth decay and gum disease.  The frequency of eye exams is based on your age, health, family medical history, use of contact lenses, and other factors. Follow your health care provider's recommendations for frequency of eye exams.  Eat a healthy diet. Foods like vegetables, fruits, whole grains, low-fat dairy products, and lean protein foods contain the nutrients you need without too many calories. Decrease your intake of foods high in solid fats, added sugars, and salt. Eat the right amount of calories for you.Get information about a proper diet from your health care provider, if necessary.  Regular physical exercise is one of the most important things you can do for your health. Most adults should get at least 150 minutes of moderate-intensity exercise (any activity that increases your heart rate and causes you to sweat) each week. In addition, most adults need muscle-strengthening exercises on 2 or more days a week.  Maintain a healthy weight. The body mass index (BMI) is a screening tool to identify possible weight problems. It provides an estimate of body fat based on height and weight. Your health care provider can find your BMI and can help you achieve or maintain a healthy weight.For adults 20 years and older:  A BMI below 18.5 is considered underweight.  A BMI of 18.5 to 24.9 is normal.  A BMI of 25 to 29.9 is considered overweight.  A BMI of  30 and above is considered obese.  Maintain normal blood lipids and cholesterol levels by exercising and minimizing your intake of saturated fat. Eat a balanced diet with plenty of fruit and vegetables. Blood tests for lipids and cholesterol should begin at age 20 and be repeated every 5 years. If your lipid or cholesterol levels are high, you are over 50, or you are at high risk for heart disease, you may need your cholesterol levels checked more frequently.Ongoing high lipid and cholesterol levels should be treated with medicines if diet and exercise are not working.  If you smoke, find out from your health care provider how to quit. If you do not use tobacco, do not start.  Lung cancer screening is recommended for adults aged 55-80 years who are at high risk for developing lung cancer because of a history of smoking. A yearly low-dose CT scan of the lungs is recommended for people who have at least a 30-pack-year history of smoking and are a current smoker or have quit within the past 15 years. A pack year of smoking is smoking an average of 1 pack of cigarettes a day for 1 year (for example: 1 pack a day for 30 years or 2 packs a day for 15 years). Yearly screening should continue until the smoker has stopped smoking for at least 15 years. Yearly screening should be stopped for people who develop a health problem that would prevent them from having lung cancer treatment.  If you are pregnant, do not drink alcohol. If you are breastfeeding,   be very cautious about drinking alcohol. If you are not pregnant and choose to drink alcohol, do not have more than 1 drink per day. One drink is considered to be 12 ounces (355 mL) of beer, 5 ounces (148 mL) of wine, or 1.5 ounces (44 mL) of liquor.  Avoid use of street drugs. Do not share needles with anyone. Ask for help if you need support or instructions about stopping the use of drugs.  High blood pressure causes heart disease and increases the risk of  stroke. Your blood pressure should be checked at least every 1 to 2 years. Ongoing high blood pressure should be treated with medicines if weight loss and exercise do not work.  If you are 75-52 years old, ask your health care provider if you should take aspirin to prevent strokes.  Diabetes screening involves taking a blood sample to check your fasting blood sugar level. This should be done once every 3 years, after age 15, if you are within normal weight and without risk factors for diabetes. Testing should be considered at a younger age or be carried out more frequently if you are overweight and have at least 1 risk factor for diabetes.  Breast cancer screening is essential preventive care for women. You should practice "breast self-awareness." This means understanding the normal appearance and feel of your breasts and may include breast self-examination. Any changes detected, no matter how small, should be reported to a health care provider. Women in their 58s and 30s should have a clinical breast exam (CBE) by a health care provider as part of a regular health exam every 1 to 3 years. After age 16, women should have a CBE every year. Starting at age 53, women should consider having a mammogram (breast X-ray test) every year. Women who have a family history of breast cancer should talk to their health care provider about genetic screening. Women at a high risk of breast cancer should talk to their health care providers about having an MRI and a mammogram every year.  Breast cancer gene (BRCA)-related cancer risk assessment is recommended for women who have family members with BRCA-related cancers. BRCA-related cancers include breast, ovarian, tubal, and peritoneal cancers. Having family members with these cancers may be associated with an increased risk for harmful changes (mutations) in the breast cancer genes BRCA1 and BRCA2. Results of the assessment will determine the need for genetic counseling and  BRCA1 and BRCA2 testing.  Routine pelvic exams to screen for cancer are no longer recommended for nonpregnant women who are considered low risk for cancer of the pelvic organs (ovaries, uterus, and vagina) and who do not have symptoms. Ask your health care provider if a screening pelvic exam is right for you.  If you have had past treatment for cervical cancer or a condition that could lead to cancer, you need Pap tests and screening for cancer for at least 20 years after your treatment. If Pap tests have been discontinued, your risk factors (such as having a new sexual partner) need to be reassessed to determine if screening should be resumed. Some women have medical problems that increase the chance of getting cervical cancer. In these cases, your health care provider may recommend more frequent screening and Pap tests.  The HPV test is an additional test that may be used for cervical cancer screening. The HPV test looks for the virus that can cause the cell changes on the cervix. The cells collected during the Pap test can be  tested for HPV. The HPV test could be used to screen women aged 30 years and older, and should be used in women of any age who have unclear Pap test results. After the age of 30, women should have HPV testing at the same frequency as a Pap test.  Colorectal cancer can be detected and often prevented. Most routine colorectal cancer screening begins at the age of 50 years and continues through age 75 years. However, your health care provider may recommend screening at an earlier age if you have risk factors for colon cancer. On a yearly basis, your health care provider may provide home test kits to check for hidden blood in the stool. Use of a small camera at the end of a tube, to directly examine the colon (sigmoidoscopy or colonoscopy), can detect the earliest forms of colorectal cancer. Talk to your health care provider about this at age 50, when routine screening begins. Direct  exam of the colon should be repeated every 5-10 years through age 75 years, unless early forms of pre-cancerous polyps or small growths are found.  People who are at an increased risk for hepatitis B should be screened for this virus. You are considered at high risk for hepatitis B if:  You were born in a country where hepatitis B occurs often. Talk with your health care provider about which countries are considered high risk.  Your parents were born in a high-risk country and you have not received a shot to protect against hepatitis B (hepatitis B vaccine).  You have HIV or AIDS.  You use needles to inject street drugs.  You live with, or have sex with, someone who has hepatitis B.  You get hemodialysis treatment.  You take certain medicines for conditions like cancer, organ transplantation, and autoimmune conditions.  Hepatitis C blood testing is recommended for all people born from 1945 through 1965 and any individual with known risks for hepatitis C.  Practice safe sex. Use condoms and avoid high-risk sexual practices to reduce the spread of sexually transmitted infections (STIs). STIs include gonorrhea, chlamydia, syphilis, trichomonas, herpes, HPV, and human immunodeficiency virus (HIV). Herpes, HIV, and HPV are viral illnesses that have no cure. They can result in disability, cancer, and death.  You should be screened for sexually transmitted illnesses (STIs) including gonorrhea and chlamydia if:  You are sexually active and are younger than 24 years.  You are older than 24 years and your health care provider tells you that you are at risk for this type of infection.  Your sexual activity has changed since you were last screened and you are at an increased risk for chlamydia or gonorrhea. Ask your health care provider if you are at risk.  If you are at risk of being infected with HIV, it is recommended that you take a prescription medicine daily to prevent HIV infection. This is  called preexposure prophylaxis (PrEP). You are considered at risk if:  You are a heterosexual woman, are sexually active, and are at increased risk for HIV infection.  You take drugs by injection.  You are sexually active with a partner who has HIV.  Talk with your health care provider about whether you are at high risk of being infected with HIV. If you choose to begin PrEP, you should first be tested for HIV. You should then be tested every 3 months for as long as you are taking PrEP.  Osteoporosis is a disease in which the bones lose minerals and strength   with aging. This can result in serious bone fractures or breaks. The risk of osteoporosis can be identified using a bone density scan. Women ages 65 years and over and women at risk for fractures or osteoporosis should discuss screening with their health care providers. Ask your health care provider whether you should take a calcium supplement or vitamin D to reduce the rate of osteoporosis.  Menopause can be associated with physical symptoms and risks. Hormone replacement therapy is available to decrease symptoms and risks. You should talk to your health care provider about whether hormone replacement therapy is right for you.  Use sunscreen. Apply sunscreen liberally and repeatedly throughout the day. You should seek shade when your shadow is shorter than you. Protect yourself by wearing long sleeves, pants, a wide-brimmed hat, and sunglasses year round, whenever you are outdoors.  Once a month, do a whole body skin exam, using a mirror to look at the skin on your back. Tell your health care provider of new moles, moles that have irregular borders, moles that are larger than a pencil eraser, or moles that have changed in shape or color.  Stay current with required vaccines (immunizations).  Influenza vaccine. All adults should be immunized every year.  Tetanus, diphtheria, and acellular pertussis (Td, Tdap) vaccine. Pregnant women should  receive 1 dose of Tdap vaccine during each pregnancy. The dose should be obtained regardless of the length of time since the last dose. Immunization is preferred during the 27th-36th week of gestation. An adult who has not previously received Tdap or who does not know her vaccine status should receive 1 dose of Tdap. This initial dose should be followed by tetanus and diphtheria toxoids (Td) booster doses every 10 years. Adults with an unknown or incomplete history of completing a 3-dose immunization series with Td-containing vaccines should begin or complete a primary immunization series including a Tdap dose. Adults should receive a Td booster every 10 years.  Varicella vaccine. An adult without evidence of immunity to varicella should receive 2 doses or a second dose if she has previously received 1 dose. Pregnant females who do not have evidence of immunity should receive the first dose after pregnancy. This first dose should be obtained before leaving the health care facility. The second dose should be obtained 4-8 weeks after the first dose.  Human papillomavirus (HPV) vaccine. Females aged 13-26 years who have not received the vaccine previously should obtain the 3-dose series. The vaccine is not recommended for use in pregnant females. However, pregnancy testing is not needed before receiving a dose. If a female is found to be pregnant after receiving a dose, no treatment is needed. In that case, the remaining doses should be delayed until after the pregnancy. Immunization is recommended for any person with an immunocompromised condition through the age of 26 years if she did not get any or all doses earlier. During the 3-dose series, the second dose should be obtained 4-8 weeks after the first dose. The third dose should be obtained 24 weeks after the first dose and 16 weeks after the second dose.  Zoster vaccine. One dose is recommended for adults aged 60 years or older unless certain conditions are  present.  Measles, mumps, and rubella (MMR) vaccine. Adults born before 1957 generally are considered immune to measles and mumps. Adults born in 1957 or later should have 1 or more doses of MMR vaccine unless there is a contraindication to the vaccine or there is laboratory evidence of immunity to   each of the three diseases. A routine second dose of MMR vaccine should be obtained at least 28 days after the first dose for students attending postsecondary schools, health care workers, or international travelers. People who received inactivated measles vaccine or an unknown type of measles vaccine during 1963-1967 should receive 2 doses of MMR vaccine. People who received inactivated mumps vaccine or an unknown type of mumps vaccine before 1979 and are at high risk for mumps infection should consider immunization with 2 doses of MMR vaccine. For females of childbearing age, rubella immunity should be determined. If there is no evidence of immunity, females who are not pregnant should be vaccinated. If there is no evidence of immunity, females who are pregnant should delay immunization until after pregnancy. Unvaccinated health care workers born before 1957 who lack laboratory evidence of measles, mumps, or rubella immunity or laboratory confirmation of disease should consider measles and mumps immunization with 2 doses of MMR vaccine or rubella immunization with 1 dose of MMR vaccine.  Pneumococcal 13-valent conjugate (PCV13) vaccine. When indicated, a person who is uncertain of her immunization history and has no record of immunization should receive the PCV13 vaccine. An adult aged 19 years or older who has certain medical conditions and has not been previously immunized should receive 1 dose of PCV13 vaccine. This PCV13 should be followed with a dose of pneumococcal polysaccharide (PPSV23) vaccine. The PPSV23 vaccine dose should be obtained at least 8 weeks after the dose of PCV13 vaccine. An adult aged 19  years or older who has certain medical conditions and previously received 1 or more doses of PPSV23 vaccine should receive 1 dose of PCV13. The PCV13 vaccine dose should be obtained 1 or more years after the last PPSV23 vaccine dose.  Pneumococcal polysaccharide (PPSV23) vaccine. When PCV13 is also indicated, PCV13 should be obtained first. All adults aged 65 years and older should be immunized. An adult younger than age 65 years who has certain medical conditions should be immunized. Any person who resides in a nursing home or long-term care facility should be immunized. An adult smoker should be immunized. People with an immunocompromised condition and certain other conditions should receive both PCV13 and PPSV23 vaccines. People with human immunodeficiency virus (HIV) infection should be immunized as soon as possible after diagnosis. Immunization during chemotherapy or radiation therapy should be avoided. Routine use of PPSV23 vaccine is not recommended for American Indians, [place] Natives, or people younger than 65 years unless there are medical conditions that require PPSV23 vaccine. When indicated, people who have unknown immunization and have no record of immunization should receive PPSV23 vaccine. One-time revaccination 5 years after the first dose of PPSV23 is recommended for people aged 19-64 years who have chronic kidney failure, nephrotic syndrome, asplenia, or immunocompromised conditions. People who received 1-2 doses of PPSV23 before age 65 years should receive another dose of PPSV23 vaccine at age 65 years or later if at least 5 years have passed since the previous dose. Doses of PPSV23 are not needed for people immunized with PPSV23 at or after age 65 years.  Meningococcal vaccine. Adults with asplenia or persistent complement component deficiencies should receive 2 doses of quadrivalent meningococcal conjugate (MenACWY-D) vaccine. The doses should be obtained at least 2 months apart.  Microbiologists working with certain meningococcal bacteria, military recruits, people at risk during an outbreak, and people who travel to or live in countries with a high rate of meningitis should be immunized. A first-year college student up through age   21 years who is living in a residence hall should receive a dose if she did not receive a dose on or after her 16th birthday. Adults who have certain high-risk conditions should receive one or more doses of vaccine.  Hepatitis A vaccine. Adults who wish to be protected from this disease, have certain high-risk conditions, work with hepatitis A-infected animals, work in hepatitis A research labs, or travel to or work in countries with a high rate of hepatitis A should be immunized. Adults who were previously unvaccinated and who anticipate close contact with an international adoptee during the first 60 days after arrival in the [place] from a country with a high rate of hepatitis A should be immunized.  Hepatitis B vaccine. Adults who wish to be protected from this disease, have certain high-risk conditions, may be exposed to blood or other infectious body fluids, are household contacts or sex partners of hepatitis B positive people, are clients or workers in certain care facilities, or travel to or work in countries with a high rate of hepatitis B should be immunized.  Haemophilus influenzae type b (Hib) vaccine. A previously unvaccinated person with asplenia or sickle cell disease or having a scheduled splenectomy should receive 1 dose of Hib vaccine. Regardless of previous immunization, a recipient of a hematopoietic stem cell transplant should receive a 3-dose series 6-12 months after her successful transplant. Hib vaccine is not recommended for adults with HIV infection. Preventive Services / Frequency Ages 64 to 68 years  Blood pressure check.** / Every 1 to 2 years.  Lipid and cholesterol check.** / Every 5 years beginning at age  22.  Clinical breast exam.** / Every 3 years for women in their 88s and 53s.  BRCA-related cancer risk assessment.** / For women who have family members with a BRCA-related cancer (breast, ovarian, tubal, or peritoneal cancers).  Pap test.** / Every 2 years from ages 90 through 51. Every 3 years starting at age 21 through age 56 or 3 with a history of 3 consecutive normal Pap tests.  HPV screening.** / Every 3 years from ages 24 through ages 1 to 46 with a history of 3 consecutive normal Pap tests.  Hepatitis C blood test.** / For any individual with known risks for hepatitis C.  Skin self-exam. / Monthly.  Influenza vaccine. / Every year.  Tetanus, diphtheria, and acellular pertussis (Tdap, Td) vaccine.** / Consult your health care provider. Pregnant women should receive 1 dose of Tdap vaccine during each pregnancy. 1 dose of Td every 10 years.  Varicella vaccine.** / Consult your health care provider. Pregnant females who do not have evidence of immunity should receive the first dose after pregnancy.  HPV vaccine. / 3 doses over 6 months, if 72 and younger. The vaccine is not recommended for use in pregnant females. However, pregnancy testing is not needed before receiving a dose.  Measles, mumps, rubella (MMR) vaccine.** / You need at least 1 dose of MMR if you were born in 1957 or later. You may also need a 2nd dose. For females of childbearing age, rubella immunity should be determined. If there is no evidence of immunity, females who are not pregnant should be vaccinated. If there is no evidence of immunity, females who are pregnant should delay immunization until after pregnancy.  Pneumococcal 13-valent conjugate (PCV13) vaccine.** / Consult your health care provider.  Pneumococcal polysaccharide (PPSV23) vaccine.** / 1 to 2 doses if you smoke cigarettes or if you have certain conditions.  Meningococcal vaccine.** /  1 dose if you are age 19 to 21 years and a first-year college  student living in a residence hall, or have one of several medical conditions, you need to get vaccinated against meningococcal disease. You may also need additional booster doses.  Hepatitis A vaccine.** / Consult your health care provider.  Hepatitis B vaccine.** / Consult your health care provider.  Haemophilus influenzae type b (Hib) vaccine.** / Consult your health care provider. Ages 40 to 64 years  Blood pressure check.** / Every 1 to 2 years.  Lipid and cholesterol check.** / Every 5 years beginning at age 20 years.  Lung cancer screening. / Every year if you are aged 55-80 years and have a 30-pack-year history of smoking and currently smoke or have quit within the past 15 years. Yearly screening is stopped once you have quit smoking for at least 15 years or develop a health problem that would prevent you from having lung cancer treatment.  Clinical breast exam.** / Every year after age 40 years.  BRCA-related cancer risk assessment.** / For women who have family members with a BRCA-related cancer (breast, ovarian, tubal, or peritoneal cancers).  Mammogram.** / Every year beginning at age 40 years and continuing for as long as you are in good health. Consult with your health care provider.  Pap test.** / Every 3 years starting at age 30 years through age 65 or 70 years with a history of 3 consecutive normal Pap tests.  HPV screening.** / Every 3 years from ages 30 years through ages 65 to 70 years with a history of 3 consecutive normal Pap tests.  Fecal occult blood test (FOBT) of stool. / Every year beginning at age 50 years and continuing until age 75 years. You may not need to do this test if you get a colonoscopy every 10 years.  Flexible sigmoidoscopy or colonoscopy.** / Every 5 years for a flexible sigmoidoscopy or every 10 years for a colonoscopy beginning at age 50 years and continuing until age 75 years.  Hepatitis C blood test.** / For all people born from 1945 through  1965 and any individual with known risks for hepatitis C.  Skin self-exam. / Monthly.  Influenza vaccine. / Every year.  Tetanus, diphtheria, and acellular pertussis (Tdap/Td) vaccine.** / Consult your health care provider. Pregnant women should receive 1 dose of Tdap vaccine during each pregnancy. 1 dose of Td every 10 years.  Varicella vaccine.** / Consult your health care provider. Pregnant females who do not have evidence of immunity should receive the first dose after pregnancy.  Zoster vaccine.** / 1 dose for adults aged 60 years or older.  Measles, mumps, rubella (MMR) vaccine.** / You need at least 1 dose of MMR if you were born in 1957 or later. You may also need a 2nd dose. For females of childbearing age, rubella immunity should be determined. If there is no evidence of immunity, females who are not pregnant should be vaccinated. If there is no evidence of immunity, females who are pregnant should delay immunization until after pregnancy.  Pneumococcal 13-valent conjugate (PCV13) vaccine.** / Consult your health care provider.  Pneumococcal polysaccharide (PPSV23) vaccine.** / 1 to 2 doses if you smoke cigarettes or if you have certain conditions.  Meningococcal vaccine.** / Consult your health care provider.  Hepatitis A vaccine.** / Consult your health care provider.  Hepatitis B vaccine.** / Consult your health care provider.  Haemophilus influenzae type b (Hib) vaccine.** / Consult your health care provider. Ages 65   years and over  Blood pressure check.** / Every 1 to 2 years.  Lipid and cholesterol check.** / Every 5 years beginning at age 22 years.  Lung cancer screening. / Every year if you are aged 73-80 years and have a 30-pack-year history of smoking and currently smoke or have quit within the past 15 years. Yearly screening is stopped once you have quit smoking for at least 15 years or develop a health problem that would prevent you from having lung cancer  treatment.  Clinical breast exam.** / Every year after age 4 years.  BRCA-related cancer risk assessment.** / For women who have family members with a BRCA-related cancer (breast, ovarian, tubal, or peritoneal cancers).  Mammogram.** / Every year beginning at age 40 years and continuing for as long as you are in good health. Consult with your health care provider.  Pap test.** / Every 3 years starting at age 9 years through age 34 or 91 years with 3 consecutive normal Pap tests. Testing can be stopped between 65 and 70 years with 3 consecutive normal Pap tests and no abnormal Pap or HPV tests in the past 10 years.  HPV screening.** / Every 3 years from ages 57 years through ages 64 or 45 years with a history of 3 consecutive normal Pap tests. Testing can be stopped between 65 and 70 years with 3 consecutive normal Pap tests and no abnormal Pap or HPV tests in the past 10 years.  Fecal occult blood test (FOBT) of stool. / Every year beginning at age 15 years and continuing until age 17 years. You may not need to do this test if you get a colonoscopy every 10 years.  Flexible sigmoidoscopy or colonoscopy.** / Every 5 years for a flexible sigmoidoscopy or every 10 years for a colonoscopy beginning at age 86 years and continuing until age 71 years.  Hepatitis C blood test.** / For all people born from [birth year] through 1967 and any individual with known risks for hepatitis C.  Osteoporosis screening.** / A one-time screening for women ages 83 years and over and women at risk for fractures or osteoporosis.  Skin self-exam. / Monthly.  Influenza vaccine. / Every year.  Tetanus, diphtheria, and acellular pertussis (Tdap/Td) vaccine.** / 1 dose of Td every 10 years.  Varicella vaccine.** / Consult your health care provider.  Zoster vaccine.** / 1 dose for adults aged 61 years or older.  Pneumococcal 13-valent conjugate (PCV13) vaccine.** / Consult your health care provider.  Pneumococcal  polysaccharide (PPSV23) vaccine.** / 1 dose for all adults aged 28 years and older.  Meningococcal vaccine.** / Consult your health care provider.  Hepatitis A vaccine.** / Consult your health care provider.  Hepatitis B vaccine.** / Consult your health care provider.  Haemophilus influenzae type b (Hib) vaccine.** / Consult your health care provider. ** Family history and personal history of risk and conditions may change your health care provider's recommendations. Document Released: 03/30/2001 Document Revised: 06/18/2013 Document Reviewed: 06/29/2010 [facility] Patient Information 2015 [place], [place]. This information is not intended to replace advice given to you by your health care provider. Make sure you discuss any questions you have with your health care provider.

## 2013-12-18 NOTE — Progress Notes (Signed)
     GYNECOLOGY CLINIC ANNUAL PREVENTATIVE CARE ENCOUNTER NOTE  Subjective:   Susan Bridges is a 33 y.o. G0P0000 female here for a routine annual gynecologic exam.   She desires screening mammogram due to a history of having a fibroadenoma excised acouple years ago and extensive FH of breast cancer. She has several maternal and paternal aunts with breast cancer; her mother underwent lumpectomy for a benign mass. She said she was told she needs frequent surveillance. Also wants to know if her fibroids are getting bigger, menorrhagia is managed effectively on OCPs. Desires OCP refills, also takes for contraception.   Obstetric and Gynecologic History Patient's last menstrual period was 10/30/2013. G0. Contraception: OCP (estrogen/progesterone) Last Pap: 12/06/12. Results were: normal pap smear and negative high-risk HPV.   Last mammogram: 01/01/13. Results were: normal   The following portions of the patient's history were reviewed and updated as appropriate: allergies, current medications, past family history, past medical history, past social history, past surgical history and problem list.   Review of Systems Pertinent items are noted in HPI.    Objective:   BP 117/95 mmHg  Pulse 91  Ht 5\' 4"  (1.626 m)  Wt 217 lb (98.431 kg)  BMI 37.23 kg/m2  LMP 10/30/2013 GENERAL: Well-developed, well-nourished female in no acute distress.  HEENT: Normocephalic, atraumatic. Sclerae anicteric.  NECK: Supple. Normal thyroid.  LUNGS: Clear to auscultation bilaterally.  HEART: Regular rate and rhythm. BREASTS: Symmetric in size. No masses, skin changes, nipple drainage, or lymphadenopathy. Healed excision site on left breast. ABDOMEN: Soft, nontender, nondistended. No organomegaly. PELVIC: Normal external female genitalia. Vagina is pink and rugated. Normal discharge. Normal cervix contour. Pap smear obtained. Uterus is enlarged to 18-20 week size and globular. No adnexal mass or  tenderness.  EXTREMITIES: No cyanosis, clubbing, or edema, 2+ distal pulses.  Labs and Imaging 12/19/2012 TRANSABDOMINAL AND TRANSVAGINAL ULTRASOUND OF PELVIS CLINICAL DATA: Enlarged uterus, uterine fibroids TECHNIQUE: Both transabdominal and transvaginal ultrasound examinations of the pelvis were performed. Transabdominal technique was performed for global imaging of the pelvis including uterus, ovaries, adnexal regions, and pelvic cul-de-sac. It was necessary to proceed with endovaginal exam following the transabdominal exam to visualize the bilateral adnexae. COMPARISON:12/31/1998  FINDINGS: Uterus Measurements: Enlarged, measuring 16.2 x 11.4 x 13.0 cm, previously 13.2 x 11.1 x 11.7 cm. Multiple uterine fibroids, including the following dominant 3 fibroids: --5.0 x 3.7 x 5.7 cm pedunculated fibroid in the left uterine fundus --7.8 x 6.5 x 7.2 cm subserosal fibroid in the posterior uterine body --6.4 x 4.7 x 5.0 cm pedunculated fibroid in the right uterine fundus Endometrium Thickness: 6 mm. No focal abnormality visualized. Right ovary Poorly visualized transvaginally (not visualized transabdominally), measuring 1.7 x 2.2 x 1.9 cm. Left ovary Poorly visualized transvaginally (not visualized transabdominally), measuring 3.3 x 2.1 x 2.6 cm. Other findings No free fluid. IMPRESSION: Enlarged uterus with multiple uterine fibroids measuring up to 7.8 cm, increased. Electronically Signed By: Julian Hy M.D. On: 12/19/2012 15:38    Assessment:   Annual gynecologic examination Fibroids and menorrhagia OCP surveillance   Plan:   Pap done, will follow up results and manage accordingly. Mammogram scheduled OCPs refilled; does not want further intervention for her fibroids and menorrhagia. Instructed to take active OCP tablets continuously.  Routine preventative health maintenance measures emphasized   Verita Schneiders, MD, [place] Attending [place] for [place]

## 2013-12-18 NOTE — Progress Notes (Signed)
Here today for gyn physical.  Wants to discuss conception. Has some discoloration on her right breast.

## 2013-12-20 LAB — CYTOLOGY - PAP

## 2013-12-24 ENCOUNTER — Encounter: Payer: Self-pay | Admitting: Obstetrics & Gynecology

## 2013-12-28 ENCOUNTER — Other Ambulatory Visit: Payer: Self-pay

## 2013-12-28 MED ORDER — LISINOPRIL 30 MG PO TABS: 30.0000 mg | ORAL_TABLET | Freq: Every day | ORAL | Status: DC

## 2013-12-28 NOTE — Telephone Encounter (Signed)
Pt going out of town and cannot get mail order before leaving town; pt request 30 day refill of lisinopril to [facility].pt advised done.

## 2014-01-13 ENCOUNTER — Other Ambulatory Visit: Payer: Self-pay | Admitting: Family Medicine

## 2014-01-24 ENCOUNTER — Encounter: Payer: Managed Care, Other (non HMO) | Admitting: Obstetrics and Gynecology

## 2014-01-30 ENCOUNTER — Telehealth: Payer: Self-pay | Admitting: Family Medicine

## 2014-01-30 NOTE — Telephone Encounter (Signed)
Left message on voicemail. Need to find out if patient wants this sent to the local pharmacy since she uses a mail order pharmacy?

## 2014-02-01 MED ORDER — LISINOPRIL 30 MG PO TABS: ORAL_TABLET | ORAL | Status: DC

## 2014-02-01 NOTE — Telephone Encounter (Addendum)
[facility] at [phone] left v/m requesting refill lisinopril.Please advise.

## 2014-02-04 MED ORDER — LISINOPRIL 30 MG PO TABS: ORAL_TABLET | ORAL | Status: DC

## 2014-02-04 NOTE — Addendum Note (Signed)
Addended by: Josetta Huddle on: 02/04/2014 10:10 AM   Modules accepted: Orders

## 2014-02-04 NOTE — Telephone Encounter (Signed)
Refill sent.

## 2014-02-20 ENCOUNTER — Ambulatory Visit (INDEPENDENT_AMBULATORY_CARE_PROVIDER_SITE_OTHER): Payer: Managed Care, Other (non HMO) | Admitting: Family Medicine

## 2014-02-20 ENCOUNTER — Encounter: Payer: Self-pay | Admitting: Family Medicine

## 2014-02-20 VITALS — BP 108/74 | HR 92 | Temp 98.6°F | Wt 219.0 lb

## 2014-02-20 DIAGNOSIS — M6530 Trigger finger, unspecified finger: Secondary | ICD-10-CM

## 2014-02-20 DIAGNOSIS — J069 Acute upper respiratory infection, unspecified: Secondary | ICD-10-CM

## 2014-02-20 MED ORDER — BENZONATATE 200 MG PO CAPS: 200.0000 mg | ORAL_CAPSULE | Freq: Three times a day (TID) | ORAL | Status: DC | PRN

## 2014-02-20 NOTE — Progress Notes (Signed)
Pre visit review using our clinic review tool, if applicable. No additional management support is needed unless otherwise documented below in the visit note.  Sx started about 1 week ago.  ST, "thought my sinuses were draining."  Voice was altered, progressively worse, some better now.   Some cough with sputum, discolored occ, sometimes clear.  Drinking hot tea with lemon in the meantime.  She may be some better than prev but not resolved.    Sugar had been better on last A1c.  She is going to get back to checking her sugar more frequently.  Discussed.    For the last 1.5 months she'll get R 2nd finger pain, and can get stuck at the R 2nd PIP.  No trauma.  She'll have to forcibly move the finger with her L hand.   Meds, vitals, and allergies reviewed.   ROS: See HPI.  Otherwise, noncontributory.  GEN: nad, alert and oriented HEENT: mucous membranes moist, tm w/o erythema, nasal exam w/o erythema, clear discharge noted,  OP with cobblestoning, sinuses not ttp  NECK: supple w/o LA CV: rrr.   PULM: ctab, no inc wob EXT: no edema SKIN: no acute rash R 2nd trigger finger with some tenderness at the PIP but no locking at the time of exam.

## 2014-02-20 NOTE — Patient Instructions (Addendum)
Susan Bridges will call about your referral (trigger finger). Use tessalon for the cough.  Drink plenty of fluids and gargle with warm salt water for your throat.   This should gradually improve.  Take care.  Let me know if you don't improve in a few days.

## 2014-02-21 DIAGNOSIS — J069 Acute upper respiratory infection, unspecified: Secondary | ICD-10-CM | POA: Insufficient documentation

## 2014-02-21 DIAGNOSIS — M6530 Trigger finger, unspecified finger: Secondary | ICD-10-CM | POA: Insufficient documentation

## 2014-02-21 NOTE — Assessment & Plan Note (Signed)
History suggestive, refer to ortho.  D/w pt.  She agrees.

## 2014-02-21 NOTE — Assessment & Plan Note (Signed)
Likely viral, nontoxic, supportive care.  See AVS. Voice rest, tessalon, salt wataer gargles, d/w pt.  She agrees.

## 2014-02-26 ENCOUNTER — Telehealth: Payer: Self-pay | Admitting: *Deleted

## 2014-02-26 MED ORDER — GUAIFENESIN-CODEINE 100-10 MG/5ML PO SYRP: 5.0000 mL | ORAL_SOLUTION | Freq: Three times a day (TID) | ORAL | Status: DC | PRN

## 2014-02-26 NOTE — Telephone Encounter (Signed)
Pt states the tessalon tablets are not helping with the cough, they make her drowsy and if she takes them at night they make her choke in her sleep, pt would like to know if there is anything else she can take?

## 2014-02-26 NOTE — Telephone Encounter (Signed)
Medication phoned to pharmacy.  

## 2014-02-26 NOTE — Telephone Encounter (Signed)
She can try cheratussin, please call in.  Avoid anything "-D" as ie could inc her BP, ie claritin-D.  Thanks.

## 2014-02-28 ENCOUNTER — Ambulatory Visit (INDEPENDENT_AMBULATORY_CARE_PROVIDER_SITE_OTHER): Payer: Managed Care, Other (non HMO) | Admitting: Obstetrics & Gynecology

## 2014-02-28 VITALS — BP 132/87 | HR 116 | Ht 64.0 in | Wt 217.0 lb

## 2014-02-28 DIAGNOSIS — R87810 Cervical high risk human papillomavirus (HPV) DNA test positive: Secondary | ICD-10-CM

## 2014-02-28 DIAGNOSIS — Z01812 Encounter for preprocedural laboratory examination: Secondary | ICD-10-CM

## 2014-02-28 LAB — POCT URINE PREGNANCY: Preg Test, Ur: NEGATIVE

## 2014-02-28 NOTE — Progress Notes (Signed)
    GYNECOLOGY CLINIC COLPOSCOPY PROCEDURE NOTE  34 y.o. 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 Discussed role for HPV in cervical dysplasia, need for surveillance.  Patient given informed consent, signed copy in the chart, time out was performed.  Placed in lithotomy position. Cervix viewed with speculum and colposcope after application of acetic acid.   Colposcopy adequate? Yes No visible lesions, no mosaicism and no abnormal vasculature.  ECC specimen obtained and sent to pathology.  Patient was given post procedure instructions.  Will follow up pathology and manage accordingly.  Routine preventative health maintenance measures emphasized.    Verita Schneiders, MD, [place] Attending [place] for [employer], [place]

## 2014-02-28 NOTE — Progress Notes (Signed)
Here today for colposcopy.

## 2014-02-28 NOTE — Patient Instructions (Signed)
Return to clinic for any scheduled appointments or for any gynecologic concerns as needed.   

## 2014-03-17 ENCOUNTER — Other Ambulatory Visit: Payer: Self-pay | Admitting: Family Medicine

## 2014-03-17 DIAGNOSIS — E119 Type 2 diabetes mellitus without complications: Secondary | ICD-10-CM

## 2014-03-21 ENCOUNTER — Other Ambulatory Visit (INDEPENDENT_AMBULATORY_CARE_PROVIDER_SITE_OTHER): Payer: Managed Care, Other (non HMO)

## 2014-03-21 DIAGNOSIS — E119 Type 2 diabetes mellitus without complications: Secondary | ICD-10-CM

## 2014-03-21 LAB — HEMOGLOBIN A1C: Hgb A1c MFr Bld: 6.8 % — ABNORMAL HIGH (ref 4.6–6.5)

## 2014-03-21 LAB — COMPREHENSIVE METABOLIC PANEL
ALK PHOS: 37 U/L — AB (ref 39–117)
ALT: 67 U/L — AB (ref 0–35)
AST: 28 U/L (ref 0–37)
Albumin: 4.2 g/dL (ref 3.5–5.2)
BUN: 15 mg/dL (ref 6–23)
CHLORIDE: 105 meq/L (ref 96–112)
CO2: 24 mEq/L (ref 19–32)
CREATININE: 1.16 mg/dL (ref 0.40–1.20)
Calcium: 9.5 mg/dL (ref 8.4–10.5)
GFR: 68.22 mL/min (ref >60.00)
Glucose, Bld: 101 mg/dL — ABNORMAL HIGH (ref 70–99)
POTASSIUM: 5 meq/L (ref 3.5–5.1)
Sodium: 136 mEq/L (ref 135–145)
Total Bilirubin: 0.3 mg/dL (ref 0.2–1.2)
Total Protein: 7.3 g/dL (ref 6.0–8.3)

## 2014-03-21 LAB — LIPID PANEL
Cholesterol: 87 mg/dL (ref 0–200)
HDL: 34.1 mg/dL — ABNORMAL LOW (ref >39.00)
LDL Cholesterol: 38 mg/dL (ref 0–99)
NonHDL: 52.9
Total CHOL/HDL Ratio: 3
Triglycerides: 75 mg/dL (ref 0.0–149.0)
VLDL: 15 mg/dL (ref 0.0–40.0)

## 2014-03-24 ENCOUNTER — Other Ambulatory Visit: Payer: Self-pay | Admitting: Family Medicine

## 2014-03-25 ENCOUNTER — Encounter: Payer: Self-pay | Admitting: Family Medicine

## 2014-03-25 ENCOUNTER — Ambulatory Visit (INDEPENDENT_AMBULATORY_CARE_PROVIDER_SITE_OTHER): Payer: Managed Care, Other (non HMO) | Admitting: Family Medicine

## 2014-03-25 VITALS — BP 114/84 | HR 92 | Temp 98.5°F | Wt 217.8 lb

## 2014-03-25 DIAGNOSIS — Z23 Encounter for immunization: Secondary | ICD-10-CM

## 2014-03-25 DIAGNOSIS — E119 Type 2 diabetes mellitus without complications: Secondary | ICD-10-CM

## 2014-03-25 NOTE — Patient Instructions (Signed)
Keep working on diet and exercise.  No change in meds.  Recheck labs in about 3 months before a visit.  Take care.  Glad to see you.

## 2014-03-25 NOTE — Progress Notes (Signed)
Pre visit review using our clinic review tool, if applicable. No additional management support is needed unless otherwise documented below in the visit note.  Diabetes:  Using medications without difficulties:yes Hypoglycemic episodes:no sx Hyperglycemic episodes:no sx Feet problems:no Blood Sugars averaging: not checked often  eye exam within last year: yes Exercise is limited.  D/w pt.   A1c d/w pt.  Labs d/w pt.   Flu shot d/w pt.    PMH and SH reviewed  Meds, vitals, and allergies reviewed.   ROS: See HPI.  Otherwise negative.    GEN: nad, alert and oriented HEENT: mucous membranes moist NECK: supple w/o LA CV: rrr. PULM: ctab, no inc wob ABD: soft, +bs EXT: no edema SKIN: no acute rash  Diabetic foot exam: Normal inspection No skin breakdown No calluses  Normal DP pulses Normal sensation to light touch and monofilament Nails not thickened.

## 2014-03-27 MED ORDER — GLIPIZIDE 10 MG PO TABS: ORAL_TABLET | ORAL | Status: DC

## 2014-03-27 NOTE — Assessment & Plan Note (Signed)
Exercise is limited. D/w pt.  A1c d/w pt. Labs d/w pt.  A1c controlled.   Flu shot d/w pt.  She is going to work on diet and exercise.  Recheck in about 3 months.

## 2014-05-02 ENCOUNTER — Other Ambulatory Visit: Payer: Self-pay | Admitting: Family Medicine

## 2014-06-20 ENCOUNTER — Other Ambulatory Visit: Payer: Self-pay | Admitting: Family Medicine

## 2014-06-20 DIAGNOSIS — E119 Type 2 diabetes mellitus without complications: Secondary | ICD-10-CM

## 2014-06-21 ENCOUNTER — Other Ambulatory Visit (INDEPENDENT_AMBULATORY_CARE_PROVIDER_SITE_OTHER): Payer: Managed Care, Other (non HMO)

## 2014-06-21 DIAGNOSIS — E119 Type 2 diabetes mellitus without complications: Secondary | ICD-10-CM | POA: Diagnosis not present

## 2014-06-21 LAB — HEMOGLOBIN A1C: Hgb A1c MFr Bld: 6.8 % — ABNORMAL HIGH (ref 4.6–6.5)

## 2014-06-25 ENCOUNTER — Ambulatory Visit (INDEPENDENT_AMBULATORY_CARE_PROVIDER_SITE_OTHER): Payer: Managed Care, Other (non HMO) | Admitting: Family Medicine

## 2014-06-25 ENCOUNTER — Encounter: Payer: Self-pay | Admitting: Family Medicine

## 2014-06-25 VITALS — BP 122/90 | HR 100 | Temp 98.4°F | Wt 219.8 lb

## 2014-06-25 DIAGNOSIS — E119 Type 2 diabetes mellitus without complications: Secondary | ICD-10-CM | POA: Diagnosis not present

## 2014-06-25 DIAGNOSIS — Z23 Encounter for immunization: Secondary | ICD-10-CM | POA: Diagnosis not present

## 2014-06-25 NOTE — Progress Notes (Signed)
Pre visit review using our clinic review tool, if applicable. No additional management support is needed unless otherwise documented below in the visit note.  Diabetes:  Using medications without difficulties: yes Hypoglycemic episodes:no sx usually, only if prolonged fasting Hyperglycemic episodes:no sx Feet problems:no Blood Sugars averaging: not checked often.  eye exam within last year: due, d/w pt.  She'll call about an exam.  A1c stable, controlled.  D/w pt.  PNA shot d/w pt.   D/w pt about diet and exercise.    Meds, vitals, and allergies reviewed.   ROS: See HPI.  Otherwise negative.    GEN: nad, alert and oriented HEENT: mucous membranes moist NECK: supple w/o LA CV: rrr. PULM: ctab, no inc wob ABD: soft, +bs EXT: no edema SKIN: no acute rash  Diabetic foot exam: Normal inspection No skin breakdown No calluses  Normal DP pulses Normal sensation to light touch and monofilament Nails normal

## 2014-06-25 NOTE — Patient Instructions (Addendum)
Try to get breakfast and lunch on a regular basis.   Recheck in about 4-5 months.  Labs ahead of time.  Try to get more exercise (ex walking). Call about an eye exam.  Take care.  Glad to see you.

## 2014-06-26 NOTE — Assessment & Plan Note (Signed)
A1c controlled, d/w pt about diet and exercise, needs weight loss.  No change in meds for now.  Pt agrees.  Recheck in a few months.  She agrees.

## 2014-09-01 ENCOUNTER — Other Ambulatory Visit: Payer: Self-pay | Admitting: Family Medicine

## 2014-09-03 ENCOUNTER — Other Ambulatory Visit: Payer: Self-pay

## 2014-09-03 MED ORDER — LISINOPRIL 30 MG PO TABS: ORAL_TABLET | ORAL | Status: DC

## 2014-09-03 NOTE — Telephone Encounter (Signed)
Pt waiting on mail order prescription for lisinopril and pt request # [phone] to [facility]; advised done.

## 2014-09-10 ENCOUNTER — Other Ambulatory Visit: Payer: Self-pay

## 2014-09-10 MED ORDER — METFORMIN HCL 850 MG PO TABS: 850.0000 mg | ORAL_TABLET | Freq: Two times a day (BID) | ORAL | Status: DC

## 2014-10-07 ENCOUNTER — Other Ambulatory Visit: Payer: Self-pay | Admitting: *Deleted

## 2014-10-07 MED ORDER — TRIAMTERENE-HCTZ 37.5-25 MG PO CAPS: ORAL_CAPSULE | ORAL | Status: DC

## 2014-11-23 ENCOUNTER — Other Ambulatory Visit: Payer: Self-pay | Admitting: Family Medicine

## 2014-11-23 DIAGNOSIS — E119 Type 2 diabetes mellitus without complications: Secondary | ICD-10-CM

## 2014-11-29 ENCOUNTER — Other Ambulatory Visit (INDEPENDENT_AMBULATORY_CARE_PROVIDER_SITE_OTHER): Payer: Managed Care, Other (non HMO)

## 2014-11-29 DIAGNOSIS — E785 Hyperlipidemia, unspecified: Secondary | ICD-10-CM

## 2014-11-29 DIAGNOSIS — E119 Type 2 diabetes mellitus without complications: Secondary | ICD-10-CM | POA: Diagnosis not present

## 2014-11-29 LAB — LIPID PANEL
CHOLESTEROL: 87 mg/dL (ref 0–200)
HDL: 37.8 mg/dL — AB (ref >39.00)
LDL Cholesterol: 34 mg/dL (ref 0–99)
NONHDL: 48.97
TRIGLYCERIDES: 73 mg/dL (ref 0.0–149.0)
Total CHOL/HDL Ratio: 2
VLDL: 14.6 mg/dL (ref 0.0–40.0)

## 2014-11-29 LAB — BASIC METABOLIC PANEL
BUN: 15 mg/dL (ref 6–23)
CO2: 25 mEq/L (ref 19–32)
Calcium: 9.1 mg/dL (ref 8.4–10.5)
Chloride: 101 mEq/L (ref 96–112)
Creatinine, Ser: 1.12 mg/dL (ref 0.40–1.20)
GFR: 70.76 mL/min (ref >60.00)
Glucose, Bld: 163 mg/dL — ABNORMAL HIGH (ref 70–99)
POTASSIUM: 4.8 meq/L (ref 3.5–5.1)
SODIUM: 135 meq/L (ref 135–145)

## 2014-11-29 LAB — HEMOGLOBIN A1C: HEMOGLOBIN A1C: 7.1 % — AB (ref 4.6–6.5)

## 2014-12-02 ENCOUNTER — Ambulatory Visit (INDEPENDENT_AMBULATORY_CARE_PROVIDER_SITE_OTHER): Payer: Managed Care, Other (non HMO) | Admitting: Family Medicine

## 2014-12-02 ENCOUNTER — Encounter: Payer: Self-pay | Admitting: Family Medicine

## 2014-12-02 VITALS — BP 108/68 | HR 98 | Temp 98.8°F | Wt 216.0 lb

## 2014-12-02 DIAGNOSIS — I10 Essential (primary) hypertension: Secondary | ICD-10-CM

## 2014-12-02 DIAGNOSIS — E119 Type 2 diabetes mellitus without complications: Secondary | ICD-10-CM | POA: Diagnosis not present

## 2014-12-02 DIAGNOSIS — Z23 Encounter for immunization: Secondary | ICD-10-CM | POA: Diagnosis not present

## 2014-12-02 NOTE — Progress Notes (Signed)
Pre visit review using our clinic review tool, if applicable. No additional management support is needed unless otherwise documented below in the visit note.  Diabetes:  Using medications without difficulties:yes  Hypoglycemic episodes:not checked, no sx Hyperglycemic episodes:not checked, no sx Feet problems:no Blood Sugars averaging:not checked often.   eye exam within last year: d/w pt about f/u A1c up slightly d/w pt.  Needs more work on diet and exercise, not a med change.   Hypertension:    Using medication without problems or lightheadedness: yes Chest pain with exertion:no Edema:no Short of breath:no Other issues: wants to get pregnant.  D/w pt about ACE.    Meds, vitals, and allergies reviewed.   ROS: See HPI.  Otherwise, noncontributory.  GEN: nad, alert and oriented HEENT: mucous membranes moist NECK: supple w/o LA CV: rrr PULM: ctab, no inc wob ABD: soft, +bs EXT: no edema SKIN: no acute rash

## 2014-12-02 NOTE — Patient Instructions (Signed)
Cut the lisinopril to 15mg  a day (1/2 tab) and update me with your BP in about 1-2 weeks.  Don't stop BC pills while still on lisinopril.   Start taking a prenatal vitamin.   Call about getting seen at the [facility].  We'll likely have to stop your cholesterol medicine.   Recheck in about 3 months, labs ahead of time.  Take care.  Glad to see you.

## 2014-12-04 MED ORDER — LISINOPRIL 30 MG PO TABS: ORAL_TABLET | ORAL | Status: DC

## 2014-12-04 NOTE — Assessment & Plan Note (Signed)
D/w pt, A1c up slightly.  Needs more work on diet and exercise, not a med change.  She agrees.  Recheck in 3 months.

## 2014-12-04 NOTE — Assessment & Plan Note (Signed)
Wants to get pregnant, ACE contraindication d/w pt.   Will dec ACE to 15mg  for now.  Start prenatal vitamin.  She'll call OB clinic, continue OCP for now.  Will likely need mult med changes before attempting pregnancy, including cessation of statin.  D/w pt . Will make 1 change at a time.  She'll update me with her BP readings on the lower dose of ACE.  She may need BB added back for BP.  Will appreciate OB clinic input.   >25 minutes spent in face to face time with patient, >50% spent in counselling or coordination of care.

## 2014-12-30 LAB — HM DIABETES EYE EXAM

## 2015-01-13 ENCOUNTER — Telehealth: Payer: Self-pay | Admitting: *Deleted

## 2015-01-13 DIAGNOSIS — Z3041 Encounter for surveillance of contraceptive pills: Secondary | ICD-10-CM

## 2015-01-13 DIAGNOSIS — N920 Excessive and frequent menstruation with regular cycle: Secondary | ICD-10-CM

## 2015-01-13 MED ORDER — NORETHIN ACE-ETH ESTRAD-FE 1.5-30 MG-MCG PO TABS: ORAL_TABLET | ORAL | Status: DC

## 2015-01-13 NOTE — Telephone Encounter (Signed)
Informed pt that birth control was sent to pharmacy, pt stated she may want to conceive next year and would like to see the physician due to her history.  Scheduled appt for 03-04-15 with Dr Harolyn Rutherford, pt to have repeat pap smear and well.

## 2015-01-16 ENCOUNTER — Encounter: Payer: Self-pay | Admitting: Family Medicine

## 2015-02-15 ENCOUNTER — Other Ambulatory Visit: Payer: Self-pay | Admitting: Family Medicine

## 2015-02-24 ENCOUNTER — Other Ambulatory Visit: Payer: Self-pay | Admitting: Family Medicine

## 2015-02-24 DIAGNOSIS — E119 Type 2 diabetes mellitus without complications: Secondary | ICD-10-CM

## 2015-02-28 ENCOUNTER — Other Ambulatory Visit (INDEPENDENT_AMBULATORY_CARE_PROVIDER_SITE_OTHER): Payer: Managed Care, Other (non HMO)

## 2015-02-28 DIAGNOSIS — E119 Type 2 diabetes mellitus without complications: Secondary | ICD-10-CM | POA: Diagnosis not present

## 2015-02-28 LAB — HEMOGLOBIN A1C: Hgb A1c MFr Bld: 8.1 % — ABNORMAL HIGH (ref 4.6–6.5)

## 2015-03-03 ENCOUNTER — Ambulatory Visit (INDEPENDENT_AMBULATORY_CARE_PROVIDER_SITE_OTHER): Payer: Managed Care, Other (non HMO) | Admitting: Family Medicine

## 2015-03-03 ENCOUNTER — Encounter: Payer: Self-pay | Admitting: Family Medicine

## 2015-03-03 VITALS — BP 128/98 | HR 98 | Temp 98.4°F | Wt 225.5 lb

## 2015-03-03 DIAGNOSIS — I10 Essential (primary) hypertension: Secondary | ICD-10-CM

## 2015-03-03 DIAGNOSIS — E119 Type 2 diabetes mellitus without complications: Secondary | ICD-10-CM | POA: Diagnosis not present

## 2015-03-03 MED ORDER — METFORMIN HCL 850 MG PO TABS: 850.0000 mg | ORAL_TABLET | Freq: Two times a day (BID) | ORAL | Status: DC

## 2015-03-03 NOTE — Assessment & Plan Note (Signed)
She is going to talk with OB/GYN tomorrow.  Off ACE in meantime, d/w pt about contraindication with ACE/ARB use and pregnancy. She felt better off the medicine but her BP is up.   I talked with patient that I want OB input re: BP meds.  No CP, SOB, BLE edema.  No change in meds at this point.  Continue work on diet and exercise.  We'll make plans re: f/u after I hear from OB GYN/patient.   >25 minutes spent in face to face time with patient, >50% spent in counselling or coordination of care.

## 2015-03-03 NOTE — Patient Instructions (Addendum)
Let me know what OB says about your BP and med options.   You can try the wart pads on your foot.   We may need to get you over to the endocrine clinic.  Get back to working on your weight with diet and exercise.

## 2015-03-03 NOTE — Assessment & Plan Note (Signed)
No change in meds at this point other than restart metformin.  Continue work on diet and exercise.  We'll make plans re: f/u after I hear from OB GYN/patient.   She agrees.

## 2015-03-03 NOTE — Progress Notes (Signed)
Pre visit review using our clinic review tool, if applicable. No additional management support is needed unless otherwise documented below in the visit note.  She is going to talk with OB/GYN tomorrow.  Off ACE in meantime, d/w pt about contraindication with ACE/ARB use and pregnancy. She felt better off the medicine but her BP is up.   I talked with patient that I want OB input re: BP meds.  No CP, SOB, BLE edema.   Diabetes:  Using medications without difficulties: yes, but had been off metformin for about 2 weeks.  She is awaiting refill from pharmacy  Hypoglycemic episodes: no Hyperglycemic episodes: no Feet problems: see below Blood Sugars averaging: not checked recently.   eye exam within last year: yes Diet has been off over the holidays.    A1c up.  D/w pt.    Some fatigue noted.  Weight is up.  She isn't exercising much.    Plantar lesion on R foot.  She has been filing it down.  Present for months.  No ulcer.  Not open. Hasn't been draining.  Tender to the touch.   Meds, vitals, and allergies reviewed.   ROS: See HPI.  Otherwise negative.    GEN: nad, alert and oriented HEENT: mucous membranes moist NECK: supple w/o LA CV: rrr. PULM: ctab, no inc wob ABD: soft, +bs EXT: no edema  Diabetic foot exam: Normal inspection except for plantar wart, <1cm, on plantar side of R foot near distal 5th MT.   No skin breakdown No calluses  Normal DP pulses Normal sensation to light touch and monofilament Nails normal

## 2015-03-04 ENCOUNTER — Telehealth: Payer: Self-pay | Admitting: Family Medicine

## 2015-03-04 ENCOUNTER — Encounter: Payer: Self-pay | Admitting: Obstetrics & Gynecology

## 2015-03-04 ENCOUNTER — Ambulatory Visit (INDEPENDENT_AMBULATORY_CARE_PROVIDER_SITE_OTHER): Payer: Managed Care, Other (non HMO) | Admitting: Obstetrics & Gynecology

## 2015-03-04 VITALS — BP 188/109 | HR 111 | Resp 18 | Ht 64.0 in | Wt 227.0 lb

## 2015-03-04 DIAGNOSIS — Z1151 Encounter for screening for human papillomavirus (HPV): Secondary | ICD-10-CM | POA: Diagnosis not present

## 2015-03-04 DIAGNOSIS — Z124 Encounter for screening for malignant neoplasm of cervix: Secondary | ICD-10-CM | POA: Diagnosis not present

## 2015-03-04 DIAGNOSIS — D259 Leiomyoma of uterus, unspecified: Secondary | ICD-10-CM

## 2015-03-04 DIAGNOSIS — I10 Essential (primary) hypertension: Secondary | ICD-10-CM | POA: Diagnosis not present

## 2015-03-04 DIAGNOSIS — R350 Frequency of micturition: Secondary | ICD-10-CM

## 2015-03-04 DIAGNOSIS — Z01419 Encounter for gynecological examination (general) (routine) without abnormal findings: Secondary | ICD-10-CM | POA: Diagnosis not present

## 2015-03-04 DIAGNOSIS — R87810 Cervical high risk human papillomavirus (HPV) DNA test positive: Secondary | ICD-10-CM

## 2015-03-04 LAB — POCT URINALYSIS DIPSTICK
BILIRUBIN UA: NEGATIVE
LEUKOCYTES UA: NEGATIVE
NITRITE UA: NEGATIVE
PH UA: 6.5
PROTEIN UA: NEGATIVE
Spec Grav, UA: 1.015
Urobilinogen, UA: 0.2

## 2015-03-04 MED ORDER — LABETALOL HCL 100 MG PO TABS: 100.0000 mg | ORAL_TABLET | Freq: Two times a day (BID) | ORAL | Status: DC

## 2015-03-04 NOTE — Patient Instructions (Signed)
Preventive Care for Adults, Female A healthy lifestyle and preventive care can promote health and wellness. Preventive health guidelines for women include the following key practices.  A routine yearly physical is a good way to check with your health care provider about your health and preventive screening. It is a chance to share any concerns and updates on your health and to receive a thorough exam.  Visit your dentist for a routine exam and preventive care every 6 months. Brush your teeth twice a day and floss once a day. Good oral hygiene prevents tooth decay and gum disease.  The frequency of eye exams is based on your age, health, family medical history, use of contact lenses, and other factors. Follow your health care provider's recommendations for frequency of eye exams.  Eat a healthy diet. Foods like vegetables, fruits, whole grains, low-fat dairy products, and lean protein foods contain the nutrients you need without too many calories. Decrease your intake of foods high in solid fats, added sugars, and salt. Eat the right amount of calories for you.Get information about a proper diet from your health care provider, if necessary.  Regular physical exercise is one of the most important things you can do for your health. Most adults should get at least 150 minutes of moderate-intensity exercise (any activity that increases your heart rate and causes you to sweat) each week. In addition, most adults need muscle-strengthening exercises on 2 or more days a week.  Maintain a healthy weight. The body mass index (BMI) is a screening tool to identify possible weight problems. It provides an estimate of body fat based on height and weight. Your health care provider can find your BMI and can help you achieve or maintain a healthy weight.For adults 20 years and older:  A BMI below 18.5 is considered underweight.  A BMI of 18.5 to 24.9 is normal.  A BMI of 25 to 29.9 is considered overweight.  A  BMI of 30 and above is considered obese.  Maintain normal blood lipids and cholesterol levels by exercising and minimizing your intake of saturated fat. Eat a balanced diet with plenty of fruit and vegetables. Blood tests for lipids and cholesterol should begin at age 45 and be repeated every 5 years. If your lipid or cholesterol levels are high, you are over 50, or you are at high risk for heart disease, you may need your cholesterol levels checked more frequently.Ongoing high lipid and cholesterol levels should be treated with medicines if diet and exercise are not working.  If you smoke, find out from your health care provider how to quit. If you do not use tobacco, do not start.  Lung cancer screening is recommended for adults aged 45-80 years who are at high risk for developing lung cancer because of a history of smoking. A yearly low-dose CT scan of the lungs is recommended for people who have at least a 30-pack-year history of smoking and are a current smoker or have quit within the past 15 years. A pack year of smoking is smoking an average of 1 pack of cigarettes a day for 1 year (for example: 1 pack a day for 30 years or 2 packs a day for 15 years). Yearly screening should continue until the smoker has stopped smoking for at least 15 years. Yearly screening should be stopped for people who develop a health problem that would prevent them from having lung cancer treatment.  If you are pregnant, do not drink alcohol. If you are  breastfeeding, be very cautious about drinking alcohol. If you are not pregnant and choose to drink alcohol, do not have more than 1 drink per day. One drink is considered to be 12 ounces (355 mL) of beer, 5 ounces (148 mL) of wine, or 1.5 ounces (44 mL) of liquor.  Avoid use of street drugs. Do not share needles with anyone. Ask for help if you need support or instructions about stopping the use of drugs.  High blood pressure causes heart disease and increases the risk  of stroke. Your blood pressure should be checked at least every 1 to 2 years. Ongoing high blood pressure should be treated with medicines if weight loss and exercise do not work.  If you are 55-79 years old, ask your health care provider if you should take aspirin to prevent strokes.  Diabetes screening is done by taking a blood sample to check your blood glucose level after you have not eaten for a certain period of time (fasting). If you are not overweight and you do not have risk factors for diabetes, you should be screened once every 3 years starting at age 45. If you are overweight or obese and you are 40-70 years of age, you should be screened for diabetes every year as part of your cardiovascular risk assessment.  Breast cancer screening is essential preventive care for women. You should practice "breast self-awareness." This means understanding the normal appearance and feel of your breasts and may include breast self-examination. Any changes detected, no matter how small, should be reported to a health care provider. Women in their 20s and 30s should have a clinical breast exam (CBE) by a health care provider as part of a regular health exam every 1 to 3 years. After age 40, women should have a CBE every year. Starting at age 40, women should consider having a mammogram (breast X-ray test) every year. Women who have a family history of breast cancer should talk to their health care provider about genetic screening. Women at a high risk of breast cancer should talk to their health care providers about having an MRI and a mammogram every year.  Breast cancer gene (BRCA)-related cancer risk assessment is recommended for women who have family members with BRCA-related cancers. BRCA-related cancers include breast, ovarian, tubal, and peritoneal cancers. Having family members with these cancers may be associated with an increased risk for harmful changes (mutations) in the breast cancer genes BRCA1 and  BRCA2. Results of the assessment will determine the need for genetic counseling and BRCA1 and BRCA2 testing.  Your health care provider may recommend that you be screened regularly for cancer of the pelvic organs (ovaries, uterus, and vagina). This screening involves a pelvic examination, including checking for microscopic changes to the surface of your cervix (Pap test). You may be encouraged to have this screening done every 3 years, beginning at age 21.  For women ages 30-65, health care providers may recommend pelvic exams and Pap testing every 3 years, or they may recommend the Pap and pelvic exam, combined with testing for human papilloma virus (HPV), every 5 years. Some types of HPV increase your risk of cervical cancer. Testing for HPV may also be done on women of any age with unclear Pap test results.  Other health care providers may not recommend any screening for nonpregnant women who are considered low risk for pelvic cancer and who do not have symptoms. Ask your health care provider if a screening pelvic exam is right for   you.  If you have had past treatment for cervical cancer or a condition that could lead to cancer, you need Pap tests and screening for cancer for at least 20 years after your treatment. If Pap tests have been discontinued, your risk factors (such as having a new sexual partner) need to be reassessed to determine if screening should resume. Some women have medical problems that increase the chance of getting cervical cancer. In these cases, your health care provider may recommend more frequent screening and Pap tests.  Colorectal cancer can be detected and often prevented. Most routine colorectal cancer screening begins at the age of 50 years and continues through age 75 years. However, your health care provider may recommend screening at an earlier age if you have risk factors for colon cancer. On a yearly basis, your health care provider may provide home test kits to check  for hidden blood in the stool. Use of a small camera at the end of a tube, to directly examine the colon (sigmoidoscopy or colonoscopy), can detect the earliest forms of colorectal cancer. Talk to your health care provider about this at age 50, when routine screening begins. Direct exam of the colon should be repeated every 5-10 years through age 75 years, unless early forms of precancerous polyps or small growths are found.  People who are at an increased risk for hepatitis B should be screened for this virus. You are considered at high risk for hepatitis B if:  You were born in a country where hepatitis B occurs often. Talk with your health care provider about which countries are considered high risk.  Your parents were born in a high-risk country and you have not received a shot to protect against hepatitis B (hepatitis B vaccine).  You have HIV or AIDS.  You use needles to inject street drugs.  You live with, or have sex with, someone who has hepatitis B.  You get hemodialysis treatment.  You take certain medicines for conditions like cancer, organ transplantation, and autoimmune conditions.  Hepatitis C blood testing is recommended for all people born from 1945 through 1965 and any individual with known risks for hepatitis C.  Practice safe sex. Use condoms and avoid high-risk sexual practices to reduce the spread of sexually transmitted infections (STIs). STIs include gonorrhea, chlamydia, syphilis, trichomonas, herpes, HPV, and human immunodeficiency virus (HIV). Herpes, HIV, and HPV are viral illnesses that have no cure. They can result in disability, cancer, and death.  You should be screened for sexually transmitted illnesses (STIs) including gonorrhea and chlamydia if:  You are sexually active and are younger than 24 years.  You are older than 24 years and your health care provider tells you that you are at risk for this type of infection.  Your sexual activity has changed  since you were last screened and you are at an increased risk for chlamydia or gonorrhea. Ask your health care provider if you are at risk.  If you are at risk of being infected with HIV, it is recommended that you take a prescription medicine daily to prevent HIV infection. This is called preexposure prophylaxis (PrEP). You are considered at risk if:  You are sexually active and do not regularly use condoms or know the HIV status of your partner(s).  You take drugs by injection.  You are sexually active with a partner who has HIV.  Talk with your health care provider about whether you are at high risk of being infected with HIV. If   you choose to begin PrEP, you should first be tested for HIV. You should then be tested every 3 months for as long as you are taking PrEP.  Osteoporosis is a disease in which the bones lose minerals and strength with aging. This can result in serious bone fractures or breaks. The risk of osteoporosis can be identified using a bone density scan. Women ages 67 years and over and women at risk for fractures or osteoporosis should discuss screening with their health care providers. Ask your health care provider whether you should take a calcium supplement or vitamin D to reduce the rate of osteoporosis.  Menopause can be associated with physical symptoms and risks. Hormone replacement therapy is available to decrease symptoms and risks. You should talk to your health care provider about whether hormone replacement therapy is right for you.  Use sunscreen. Apply sunscreen liberally and repeatedly throughout the day. You should seek shade when your shadow is shorter than you. Protect yourself by wearing long sleeves, pants, a wide-brimmed hat, and sunglasses year round, whenever you are outdoors.  Once a month, do a whole body skin exam, using a mirror to look at the skin on your back. Tell your health care provider of new moles, moles that have irregular borders, moles that  are larger than a pencil eraser, or moles that have changed in shape or color.  Stay current with required vaccines (immunizations).  Influenza vaccine. All adults should be immunized every year.  Tetanus, diphtheria, and acellular pertussis (Td, Tdap) vaccine. Pregnant women should receive 1 dose of Tdap vaccine during each pregnancy. The dose should be obtained regardless of the length of time since the last dose. Immunization is preferred during the 27th-36th week of gestation. An adult who has not previously received Tdap or who does not know her vaccine status should receive 1 dose of Tdap. This initial dose should be followed by tetanus and diphtheria toxoids (Td) booster doses every 10 years. Adults with an unknown or incomplete history of completing a 3-dose immunization series with Td-containing vaccines should begin or complete a primary immunization series including a Tdap dose. Adults should receive a Td booster every 10 years.  Varicella vaccine. An adult without evidence of immunity to varicella should receive 2 doses or a second dose if she has previously received 1 dose. Pregnant females who do not have evidence of immunity should receive the first dose after pregnancy. This first dose should be obtained before leaving the health care facility. The second dose should be obtained 4-8 weeks after the first dose.  Human papillomavirus (HPV) vaccine. Females aged 13-26 years who have not received the vaccine previously should obtain the 3-dose series. The vaccine is not recommended for use in pregnant females. However, pregnancy testing is not needed before receiving a dose. If a female is found to be pregnant after receiving a dose, no treatment is needed. In that case, the remaining doses should be delayed until after the pregnancy. Immunization is recommended for any person with an immunocompromised condition through the age of 61 years if she did not get any or all doses earlier. During the  3-dose series, the second dose should be obtained 4-8 weeks after the first dose. The third dose should be obtained 24 weeks after the first dose and 16 weeks after the second dose.  Zoster vaccine. One dose is recommended for adults aged 30 years or older unless certain conditions are present.  Measles, mumps, and rubella (MMR) vaccine. Adults born  before 1957 generally are considered immune to measles and mumps. Adults born in 1957 or later should have 1 or more doses of MMR vaccine unless there is a contraindication to the vaccine or there is laboratory evidence of immunity to each of the three diseases. A routine second dose of MMR vaccine should be obtained at least 28 days after the first dose for students attending postsecondary schools, health care workers, or international travelers. People who received inactivated measles vaccine or an unknown type of measles vaccine during 1963-1967 should receive 2 doses of MMR vaccine. People who received inactivated mumps vaccine or an unknown type of mumps vaccine before 1979 and are at high risk for mumps infection should consider immunization with 2 doses of MMR vaccine. For females of childbearing age, rubella immunity should be determined. If there is no evidence of immunity, females who are not pregnant should be vaccinated. If there is no evidence of immunity, females who are pregnant should delay immunization until after pregnancy. Unvaccinated health care workers born before 1957 who lack laboratory evidence of measles, mumps, or rubella immunity or laboratory confirmation of disease should consider measles and mumps immunization with 2 doses of MMR vaccine or rubella immunization with 1 dose of MMR vaccine.  Pneumococcal 13-valent conjugate (PCV13) vaccine. When indicated, a person who is uncertain of his immunization history and has no record of immunization should receive the PCV13 vaccine. All adults 65 years of age and older should receive this  vaccine. An adult aged 19 years or older who has certain medical conditions and has not been previously immunized should receive 1 dose of PCV13 vaccine. This PCV13 should be followed with a dose of pneumococcal polysaccharide (PPSV23) vaccine. Adults who are at high risk for pneumococcal disease should obtain the PPSV23 vaccine at least 8 weeks after the dose of PCV13 vaccine. Adults older than 37 years of age who have normal immune system function should obtain the PPSV23 vaccine dose at least 1 year after the dose of PCV13 vaccine.  Pneumococcal polysaccharide (PPSV23) vaccine. When PCV13 is also indicated, PCV13 should be obtained first. All adults aged 65 years and older should be immunized. An adult younger than age 65 years who has certain medical conditions should be immunized. Any person who resides in a nursing home or long-term care facility should be immunized. An adult smoker should be immunized. People with an immunocompromised condition and certain other conditions should receive both PCV13 and PPSV23 vaccines. People with human immunodeficiency virus (HIV) infection should be immunized as soon as possible after diagnosis. Immunization during chemotherapy or radiation therapy should be avoided. Routine use of PPSV23 vaccine is not recommended for American Indians, [place] Natives, or people younger than 65 years unless there are medical conditions that require PPSV23 vaccine. When indicated, people who have unknown immunization and have no record of immunization should receive PPSV23 vaccine. One-time revaccination 5 years after the first dose of PPSV23 is recommended for people aged 19-64 years who have chronic kidney failure, nephrotic syndrome, asplenia, or immunocompromised conditions. People who received 1-2 doses of PPSV23 before age 65 years should receive another dose of PPSV23 vaccine at age 65 years or later if at least 5 years have passed since the previous dose. Doses of PPSV23 are not  needed for people immunized with PPSV23 at or after age 65 years.  Meningococcal vaccine. Adults with asplenia or persistent complement component deficiencies should receive 2 doses of quadrivalent meningococcal conjugate (MenACWY-D) vaccine. The doses should be obtained   at least 2 months apart. Microbiologists working with certain meningococcal bacteria, [place] recruits, people at risk during an outbreak, and people who travel to or live in countries with a high rate of meningitis should be immunized. A first-year college student up through age 34 years who is living in a residence hall should receive a dose if she did not receive a dose on or after her 16th birthday. Adults who have certain high-risk conditions should receive one or more doses of vaccine.  Hepatitis A vaccine. Adults who wish to be protected from this disease, have certain high-risk conditions, work with hepatitis A-infected animals, work in hepatitis A research labs, or travel to or work in countries with a high rate of hepatitis A should be immunized. Adults who were previously unvaccinated and who anticipate close contact with an international adoptee during the first 60 days after arrival in the [place] from a country with a high rate of hepatitis A should be immunized.  Hepatitis B vaccine. Adults who wish to be protected from this disease, have certain high-risk conditions, may be exposed to blood or other infectious body fluids, are household contacts or sex partners of hepatitis B positive people, are clients or workers in certain care facilities, or travel to or work in countries with a high rate of hepatitis B should be immunized.  Haemophilus influenzae type b (Hib) vaccine. A previously unvaccinated person with asplenia or sickle cell disease or having a scheduled splenectomy should receive 1 dose of Hib vaccine. Regardless of previous immunization, a recipient of a hematopoietic stem cell transplant should receive a  3-dose series 6-12 months after her successful transplant. Hib vaccine is not recommended for adults with HIV infection. Preventive Services / Frequency Ages 35 to 4 years  Blood pressure check.** / Every 3-5 years.  Lipid and cholesterol check.** / Every 5 years beginning at age 60.  Clinical breast exam.** / Every 3 years for women in their 71s and 10s.  BRCA-related cancer risk assessment.** / For women who have family members with a BRCA-related cancer (breast, ovarian, tubal, or peritoneal cancers).  Pap test.** / Every 2 years from ages 76 through 26. Every 3 years starting at age 61 through age 76 or 93 with a history of 3 consecutive normal Pap tests.  HPV screening.** / Every 3 years from ages 37 through ages 60 to 51 with a history of 3 consecutive normal Pap tests.  Hepatitis C blood test.** / For any individual with known risks for hepatitis C.  Skin self-exam. / Monthly.  Influenza vaccine. / Every year.  Tetanus, diphtheria, and acellular pertussis (Tdap, Td) vaccine.** / Consult your health care provider. Pregnant women should receive 1 dose of Tdap vaccine during each pregnancy. 1 dose of Td every 10 years.  Varicella vaccine.** / Consult your health care provider. Pregnant females who do not have evidence of immunity should receive the first dose after pregnancy.  HPV vaccine. / 3 doses over 6 months, if 93 and younger. The vaccine is not recommended for use in pregnant females. However, pregnancy testing is not needed before receiving a dose.  Measles, mumps, rubella (MMR) vaccine.** / You need at least 1 dose of MMR if you were born in 1957 or later. You may also need a 2nd dose. For females of childbearing age, rubella immunity should be determined. If there is no evidence of immunity, females who are not pregnant should be vaccinated. If there is no evidence of immunity, females who are  pregnant should delay immunization until after pregnancy.  Pneumococcal  13-valent conjugate (PCV13) vaccine.** / Consult your health care provider.  Pneumococcal polysaccharide (PPSV23) vaccine.** / 1 to 2 doses if you smoke cigarettes or if you have certain conditions.  Meningococcal vaccine.** / 1 dose if you are age 68 to 8 years and a Market researcher living in a residence hall, or have one of several medical conditions, you need to get vaccinated against meningococcal disease. You may also need additional booster doses.  Hepatitis A vaccine.** / Consult your health care provider.  Hepatitis B vaccine.** / Consult your health care provider.  Haemophilus influenzae type b (Hib) vaccine.** / Consult your health care provider. Ages 7 to 53 years  Blood pressure check.** / Every year.  Lipid and cholesterol check.** / Every 5 years beginning at age 25 years.  Lung cancer screening. / Every year if you are aged 11-80 years and have a 30-pack-year history of smoking and currently smoke or have quit within the past 15 years. Yearly screening is stopped once you have quit smoking for at least 15 years or develop a health problem that would prevent you from having lung cancer treatment.  Clinical breast exam.** / Every year after age 48 years.  BRCA-related cancer risk assessment.** / For women who have family members with a BRCA-related cancer (breast, ovarian, tubal, or peritoneal cancers).  Mammogram.** / Every year beginning at age 41 years and continuing for as long as you are in good health. Consult with your health care provider.  Pap test.** / Every 3 years starting at age 65 years through age 37 or 70 years with a history of 3 consecutive normal Pap tests.  HPV screening.** / Every 3 years from ages 72 years through ages 60 to 40 years with a history of 3 consecutive normal Pap tests.  Fecal occult blood test (FOBT) of stool. / Every year beginning at age 21 years and continuing until age 5 years. You may not need to do this test if you get  a colonoscopy every 10 years.  Flexible sigmoidoscopy or colonoscopy.** / Every 5 years for a flexible sigmoidoscopy or every 10 years for a colonoscopy beginning at age 35 years and continuing until age 48 years.  Hepatitis C blood test.** / For all people born from [birth year] through 1965 and any individual with known risks for hepatitis C.  Skin self-exam. / Monthly.  Influenza vaccine. / Every year.  Tetanus, diphtheria, and acellular pertussis (Tdap/Td) vaccine.** / Consult your health care provider. Pregnant women should receive 1 dose of Tdap vaccine during each pregnancy. 1 dose of Td every 10 years.  Varicella vaccine.** / Consult your health care provider. Pregnant females who do not have evidence of immunity should receive the first dose after pregnancy.  Zoster vaccine.** / 1 dose for adults aged 30 years or older.  Measles, mumps, rubella (MMR) vaccine.** / You need at least 1 dose of MMR if you were born in 1957 or later. You may also need a second dose. For females of childbearing age, rubella immunity should be determined. If there is no evidence of immunity, females who are not pregnant should be vaccinated. If there is no evidence of immunity, females who are pregnant should delay immunization until after pregnancy.  Pneumococcal 13-valent conjugate (PCV13) vaccine.** / Consult your health care provider.  Pneumococcal polysaccharide (PPSV23) vaccine.** / 1 to 2 doses if you smoke cigarettes or if you have certain conditions.  Meningococcal vaccine.** /  Consult your health care provider.  Hepatitis A vaccine.** / Consult your health care provider.  Hepatitis B vaccine.** / Consult your health care provider.  Haemophilus influenzae type b (Hib) vaccine.** / Consult your health care provider. Ages 64 years and over  Blood pressure check.** / Every year.  Lipid and cholesterol check.** / Every 5 years beginning at age 23 years.  Lung cancer screening. / Every year if you  are aged 16-80 years and have a 30-pack-year history of smoking and currently smoke or have quit within the past 15 years. Yearly screening is stopped once you have quit smoking for at least 15 years or develop a health problem that would prevent you from having lung cancer treatment.  Clinical breast exam.** / Every year after age 74 years.  BRCA-related cancer risk assessment.** / For women who have family members with a BRCA-related cancer (breast, ovarian, tubal, or peritoneal cancers).  Mammogram.** / Every year beginning at age 44 years and continuing for as long as you are in good health. Consult with your health care provider.  Pap test.** / Every 3 years starting at age 58 years through age 22 or 39 years with 3 consecutive normal Pap tests. Testing can be stopped between 65 and 70 years with 3 consecutive normal Pap tests and no abnormal Pap or HPV tests in the past 10 years.  HPV screening.** / Every 3 years from ages 64 years through ages 70 or 61 years with a history of 3 consecutive normal Pap tests. Testing can be stopped between 65 and 70 years with 3 consecutive normal Pap tests and no abnormal Pap or HPV tests in the past 10 years.  Fecal occult blood test (FOBT) of stool. / Every year beginning at age 40 years and continuing until age 27 years. You may not need to do this test if you get a colonoscopy every 10 years.  Flexible sigmoidoscopy or colonoscopy.** / Every 5 years for a flexible sigmoidoscopy or every 10 years for a colonoscopy beginning at age 7 years and continuing until age 32 years.  Hepatitis C blood test.** / For all people born from [birth year] through 1967 and any individual with known risks for hepatitis C.  Osteoporosis screening.** / A one-time screening for women ages 30 years and over and women at risk for fractures or osteoporosis.  Skin self-exam. / Monthly.  Influenza vaccine. / Every year.  Tetanus, diphtheria, and acellular pertussis (Tdap/Td)  vaccine.** / 1 dose of Td every 10 years.  Varicella vaccine.** / Consult your health care provider.  Zoster vaccine.** / 1 dose for adults aged 35 years or older.  Pneumococcal 13-valent conjugate (PCV13) vaccine.** / Consult your health care provider.  Pneumococcal polysaccharide (PPSV23) vaccine.** / 1 dose for all adults aged 46 years and older.  Meningococcal vaccine.** / Consult your health care provider.  Hepatitis A vaccine.** / Consult your health care provider.  Hepatitis B vaccine.** / Consult your health care provider.  Haemophilus influenzae type b (Hib) vaccine.** / Consult your health care provider. ** Family history and personal history of risk and conditions may change your health care provider's recommendations.   This information is not intended to replace advice given to you by your health care provider. Make sure you discuss any questions you have with your health care provider.   Document Released: 03/30/2001 Document Revised: 02/22/2014 Document Reviewed: 06/29/2010 Elsevier Interactive Patient Education [employer].

## 2015-03-04 NOTE — Progress Notes (Signed)
GYNECOLOGY CLINIC ANNUAL PREVENTATIVE CARE ENCOUNTER NOTE  Subjective:   Susan Bridges is a 35 y.o. G0P0000 female here for a routine annual gynecologic exam.  Current complaints: pelvic pressure and increased urination attributed to fibroids.  Wants ultrasound evaluation as she feels they have gotten bigger.  Also desires preconceptual counseling; still on OCPs for contraception and AUB. Wants to discuss medications for HTN and DM and safety during conception/pregnancy.  Denies abnormal vaginal bleeding, discharge,  problems with intercourse or other gynecologic concerns.    Gynecologic History Patient's last menstrual period was 12/30/2014 (approximate). Contraception: OCP (estrogen/progesterone) Last Pap: 12/18/2013. 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 Benign pathology. Last diagnostic mammogram: 01/01/2013. Results were: normal. Was done for history of fibroadenoma Recommendation was for initiating screening mammogram beginning at age 27.  Obstetric History OB History  Gravida Para Term Preterm AB SAB TAB Ectopic Multiple Living  0 0 0 0 0 0 0 0 0 0         Past Medical History  Diagnosis Date  . OSA (obstructive sleep apnea)     s/p UPPP  . Hypertension   . Hyperlipidemia   . Anemia     from heavy menses  . Fibroadenoma of breast   . Diabetes mellitus   . Obesity     Past Surgical History  Procedure Laterality Date  . Uvulopalatopharyngoplasty    . Tonsillectomy and adenoidectomy    . Breast biopsy    . 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 atorvastatin (LIPITOR) 20 MG tablet TAKE 1 TABLET DAILY 90 tablet 2  . diltiazem (TIAZAC) 420 MG 24 hr capsule TAKE 1 CAPSULE DAILY 90 capsule 2  . ferrous sulfate 325 (65 FE) MG EC tablet Take 325 mg by mouth daily with breakfast.    . glipiZIDE (GLUCOTROL) 10 MG tablet TAKE 1 TABLET  BY MOUTH EVERY DAY BEFORE BREAKFAST 90 tablet 3  . glucose blood (FORA V30A BLOOD GLUCOSE TEST) test strip Use as instructed 100 each 3  . metFORMIN (GLUCOPHAGE) 850 MG tablet Take 1 tablet (850 mg total) by mouth 2 (two) times daily with a meal. 180 tablet 3  . norethindrone-ethinyl estradiol-iron (JUNEL FE 1.5/30) 1.5-30 MG-MCG tablet TAKE 1 ACTIVE TABLET DAILY IN A CONTINUOUS FASHION FOR THREE MONTHS 4 Package 3  . triamterene-hydrochlorothiazide (DYAZIDE) 37.5-25 MG per capsule TAKE 1 CAPSULE EVERY MORNING 90 capsule 1   No current facility-administered medications on file prior to visit.    Allergies  Allergen Reactions  . Amlodipine     Avoid CCBs due to gum disease.   . Calcium Channel Blockers     Avoid CCBs other than diltiazem due to gum disease.     Social History   Social History  . Marital Status: Single    Spouse Name: N/A  . Number of Children: N/A  . Years of Education: N/A   Occupational History  . Not on file.   Social History Main Topics  . Smoking status: Never Smoker   . Smokeless tobacco: Never Used  . Alcohol Use: 0.0 oz/week    0 Standard drinks or equivalent per week     Comment: socially  . Drug Use: No  . Sexual Activity:    Partners: Male    Birth Control/ Protection: Pill   Other Topics Concern  . 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 Heart disease Mother   . Hypertension Mother   . Diabetes Mother   . Hypertension Father   . Hyperlipidemia Father   . Pulmonary embolism Father   . Diabetes Maternal Grandmother   . Stroke Maternal Grandmother   . Heart disease Paternal Grandfather   . Hypertension Paternal Grandfather   . Colon cancer Neg Hx   . Breast cancer Other     The following portions of the patient's history were reviewed and updated as appropriate: allergies, current medications, past family history, past  medical history, past social history, past surgical history and problem list.  Review of Systems Pertinent items noted in HPI and remainder of comprehensive ROS otherwise negative.   Objective:  BP 188/109 mmHg  Pulse 111  Resp 18  Ht 5\' 4"  (1.626 m)  Wt 227 lb (102.967 kg)  BMI 38.95 kg/m2  LMP 12/30/2014 (Approximate) CONSTITUTIONAL: Well-developed, well-nourished female in no acute distress.  HENT:  Normocephalic, atraumatic, External right and left ear normal. Oropharynx is clear and moist EYES: Conjunctivae and EOM are normal. Pupils are equal, round, and reactive to light. No scleral icterus.  NECK: Normal range of motion, supple, no masses.  Normal thyroid.  SKIN: Skin is warm and dry. No rash noted. Not diaphoretic. No erythema. No pallor. Wood: Alert and oriented to person, place, and time. Normal reflexes, muscle tone coordination. No cranial nerve deficit noted. PSYCHIATRIC: Normal mood and affect. Normal behavior. Normal judgment and thought content. CARDIOVASCULAR: Normal heart rate noted, regular rhythm RESPIRATORY: Clear to auscultation bilaterally. Effort and breath sounds normal, no problems with respiration noted. BREASTS: Symmetric in size. No masses, skin changes, nipple drainage, or lymphadenopathy. ABDOMEN: Soft, normal bowel sounds, no distention noted.  No tenderness, rebound or guarding.  PELVIC: Normal appearing external genitalia; normal appearing vaginal mucosa and cervix.  No abnormal discharge noted.  Pap smear obtained.  20 week sized anteverted globular uterus palpated, unable to palpate adnexa.  Nontender.  MUSCULOSKELETAL: Normal range of motion. No tenderness.  No cyanosis, clubbing, or edema.  2+ distal pulses.  Labs and Imaging 12/19/2012 TRANSABDOMINAL AND TRANSVAGINAL ULTRASOUND OF PELVIS CLINICAL DATA: Enlarged uterus, uterine fibroids TECHNIQUE: Both transabdominal and transvaginal ultrasound examinations of the pelvis were performed.  Transabdominal technique was performed for global imaging of the pelvis including uterus, ovaries, adnexal regions, and pelvic cul-de-sac. It was necessary to proceed with endovaginal exam following the transabdominal exam to visualize the bilateral adnexae. COMPARISON:12/31/1998 FINDINGS: Uterus Measurements: Enlarged, measuring 16.2 x 11.4 x 13.0 cm, previously 13.2 x 11.1 x 11.7 cm. Multiple uterine fibroids, including the following dominant 3 fibroids: --5.0 x 3.7 x 5.7 cm pedunculated fibroid in the left uterine fundus --7.8 x 6.5 x 7.2 cm subserosal fibroid in the posterior uterine body --6.4 x 4.7 x 5.0 cm pedunculated fibroid in the right uterine fundus Endometrium Thickness: 6 mm. No focal abnormality visualized. Right ovary Poorly visualized transvaginally (not visualized transabdominally), measuring 1.7 x 2.2 x 1.9 cm. Left ovary Poorly visualized transvaginally (not visualized transabdominally), measuring 3.3 x 2.1 x 2.6 cm. Other findings No free fluid. IMPRESSION: Enlarged uterus with multiple uterine fibroids measuring up to 7.8 cm, increased. Electronically Signed By: Julian Hy M.D. On: 12/19/2012 15:38    Results for orders placed or performed in visit on 03/04/15 (from the  past 24 hour(s))  POCT Urinalysis Dipstick     Status: Abnormal   Collection Time: 03/04/15 10:28 AM  Result Value Ref Range   Color, UA Yellow    Clarity, UA Clear    Glucose, UA Moderate    Bilirubin, UA Negative    Ketones, UA Moderate    Spec Grav, UA 1.015    Blood, UA Small    pH, UA 6.5    Protein, UA Negative    Urobilinogen, UA 0.2    Nitrite, UA Negative    Leukocytes, UA Negative Negative    Assessment:  Annual gynecologic examination with pap smear Preconceptual counseling Fibroids and AUB managed on OCPs Increased urinary frequency   Plan:  Will follow up results of pap smear and manage accordingly. Preconceptual counseling done with patient; encouraged  weight loss, optimization of DM and HTN and other conditions, taking prenatal vitamins with folic acid and avoiding teratogens. Patient will decide when to stop OCPs. Use of OTC ovulation predictor kits recommended. Continue HTN management as per PCP; avoid ARB and ACE inhibitors.  Calcium-channel blockers, labetalol and diuretics can be used even when patient is attempting to conceive.   Continue DM medications Enlarged fibroid uterus noted, ultrasound ordered for reevaluation.  Will discuss possible surgery if indicated for uterine optimization for pregnancy. Continue OCPs for now for AUB, will consider Lysteda when she stops OCPs for conception.  Fibroid uterus is causing symptoms of increased urinary frequency as evidenced on exam.  Negative UA today. Routine preventative health maintenance measures emphasized. Please refer to After Visit Summary for other counseling recommendations.    Verita Schneiders, MD, [place] Attending Obstetrician & Gynecologist, [place] [facility]

## 2015-03-04 NOTE — Progress Notes (Signed)
Pt here today for annual physical exam, would like to discuss conception counseling and her BP.  Pt c/o frequent urination, believed to be caused by the uterine fibroids.

## 2015-03-04 NOTE — Telephone Encounter (Signed)
Gyn note reviewed.  Would add on labetalol.  rx sent.  Have her update me re: BP in 2-3 days.  Thanks.

## 2015-03-04 NOTE — Telephone Encounter (Signed)
Pt came in today to give her numbers for her bp. She had on her right forearm was 188/109 and the left upper arm was 146/104.  She had her appt with her OBGYN yesterday, she said she is not saying her can't be on certain medication, but just certain types. Pt states the appt note is in the system for Dr. Damita Dunnings to review.  Pt is requesting upping the bp medication due to the numbers. The best number to reach pt for more information is [phone].

## 2015-03-04 NOTE — Telephone Encounter (Signed)
Patient said Dr.Duncan can see ob/gyn's note in [place].  Patient said ob/gyn wants Dr.Duncan to decide about her bp medication.  Patient said it says in the note what patient can't take for her bp. Patient is at work.  Patient can be reached at [phone].

## 2015-03-04 NOTE — Telephone Encounter (Signed)
Patient advised.

## 2015-03-05 ENCOUNTER — Emergency Department (HOSPITAL_COMMUNITY): Payer: Managed Care, Other (non HMO)

## 2015-03-05 ENCOUNTER — Emergency Department (HOSPITAL_COMMUNITY)
Admission: EM | Admit: 2015-03-05 | Discharge: 2015-03-05 | Disposition: A | Discharge status: Home / Self Care | Payer: Managed Care, Other (non HMO) | Attending: Emergency Medicine | Admitting: Emergency Medicine

## 2015-03-05 ENCOUNTER — Encounter (HOSPITAL_COMMUNITY): Payer: Self-pay | Admitting: *Deleted

## 2015-03-05 ENCOUNTER — Telehealth: Payer: Self-pay | Admitting: Family Medicine

## 2015-03-05 ENCOUNTER — Other Ambulatory Visit: Payer: Self-pay

## 2015-03-05 DIAGNOSIS — Z86018 Personal history of other benign neoplasm: Secondary | ICD-10-CM | POA: Insufficient documentation

## 2015-03-05 DIAGNOSIS — Z8669 Personal history of other diseases of the nervous system and sense organs: Secondary | ICD-10-CM | POA: Insufficient documentation

## 2015-03-05 DIAGNOSIS — R739 Hyperglycemia, unspecified: Secondary | ICD-10-CM

## 2015-03-05 DIAGNOSIS — F419 Anxiety disorder, unspecified: Secondary | ICD-10-CM | POA: Diagnosis not present

## 2015-03-05 DIAGNOSIS — I10 Essential (primary) hypertension: Secondary | ICD-10-CM | POA: Diagnosis not present

## 2015-03-05 DIAGNOSIS — D649 Anemia, unspecified: Secondary | ICD-10-CM | POA: Diagnosis not present

## 2015-03-05 DIAGNOSIS — Z7984 Long term (current) use of oral hypoglycemic drugs: Secondary | ICD-10-CM | POA: Diagnosis not present

## 2015-03-05 DIAGNOSIS — E785 Hyperlipidemia, unspecified: Secondary | ICD-10-CM | POA: Insufficient documentation

## 2015-03-05 DIAGNOSIS — R0789 Other chest pain: Secondary | ICD-10-CM | POA: Diagnosis not present

## 2015-03-05 DIAGNOSIS — E1165 Type 2 diabetes mellitus with hyperglycemia: Secondary | ICD-10-CM | POA: Diagnosis not present

## 2015-03-05 DIAGNOSIS — E669 Obesity, unspecified: Secondary | ICD-10-CM | POA: Diagnosis not present

## 2015-03-05 DIAGNOSIS — R079 Chest pain, unspecified: Secondary | ICD-10-CM | POA: Diagnosis present

## 2015-03-05 DIAGNOSIS — Z79899 Other long term (current) drug therapy: Secondary | ICD-10-CM | POA: Insufficient documentation

## 2015-03-05 HISTORY — DX: Leiomyoma of uterus, unspecified: D25.9

## 2015-03-05 LAB — CBC
HCT: 43.1 % (ref 36.0–46.0)
Hemoglobin: 14.5 g/dL (ref 12.0–15.0)
MCH: 30.7 pg (ref 26.0–34.0)
MCHC: 33.6 g/dL (ref 30.0–36.0)
MCV: 91.3 fL (ref 78.0–100.0)
PLATELETS: 368 10*3/uL (ref 150–400)
RBC: 4.72 MIL/uL (ref 3.87–5.11)
RDW: 11.9 % (ref 11.5–15.5)
WBC: 7.5 10*3/uL (ref 4.0–10.5)

## 2015-03-05 LAB — BASIC METABOLIC PANEL
Anion gap: 11 (ref 5–15)
BUN: 13 mg/dL (ref 6–20)
CALCIUM: 9.3 mg/dL (ref 8.9–10.3)
CO2: 24 mmol/L (ref 22–32)
CREATININE: 1.03 mg/dL — AB (ref 0.44–1.00)
Chloride: 101 mmol/L (ref 101–111)
GFR calc non Af Amer: >60 mL/min (ref >60)
Glucose, Bld: 214 mg/dL — ABNORMAL HIGH (ref 65–99)
Potassium: 4.2 mmol/L (ref 3.5–5.1)
SODIUM: 136 mmol/L (ref 135–145)

## 2015-03-05 LAB — I-STAT TROPONIN, ED: TROPONIN I, POC: 0 ng/mL (ref 0.00–0.08)

## 2015-03-05 MED ORDER — METFORMIN HCL 850 MG PO TABS
850.0000 mg | ORAL_TABLET | Freq: Two times a day (BID) | ORAL | Status: DC
Start: 2015-03-05 — End: 2016-02-02

## 2015-03-05 NOTE — Telephone Encounter (Signed)
Patient Name: Susan Bridges  DOB: 1980/08/06    Initial Comment Caller states c/o elevated blood pressure 141/96 despite medications, also needs Metformin Rx   Nurse Assessment  Nurse: Leilani Merl, RN, Heather Date/Time (Eastern Time): 03/05/2015 12:33:19 PM  Confirm and document reason for call. If symptomatic, describe symptoms. You must click the next button to save text entered. ---Caller states c/o elevated blood pressure 141/96 despite medications, also needs Metformin Rx. She has been having chest tightness since she started the medication yesterday.  Has the patient traveled out of the country within the last 30 days? ---Not Applicable  Does the patient have any new or worsening symptoms? ---Yes  Will a triage be completed? ---Yes  Related visit to physician within the last 2 weeks? ---No  Does the PT have any chronic conditions? (i.e. diabetes, asthma, etc.) ---Yes  List chronic conditions. ---HTN, DM  Did the patient indicate they were pregnant? ---No  Is this a behavioral health or substance abuse call? ---No     Guidelines    Guideline Title Affirmed Question Affirmed Notes  Chest Pain Chest pain lasts > 5 minutes (Exceptions: chest pain occurring > 3 days ago and now asymptomatic; same as previously diagnosed heartburn and has accompanying sour taste in mouth)    Final Disposition User   Go to ED Now (or PCP triage) [place], RN, [place] states that she is having chest tightness since she started the blood pressure medication yesterday. Caller states that she also need metformin called in to a local pharmacy so she can go ahead and take it until the medication arrives by mail.   Referrals  [facility] - ED   Disagree/Comply: Comply

## 2015-03-05 NOTE — Telephone Encounter (Signed)
Talking with pt about refill of metformin and she mentioned elevated BP; pt has chest pressure and does not feel "her normal self". No pain or SOB now. pts father is going to take pt to ED now.

## 2015-03-05 NOTE — ED Provider Notes (Signed)
CSN: [id number]     Arrival date & time 03/05/15  1323 History   First MD Initiated Contact with Patient 03/05/15 1727     Chief Complaint  Patient presents with  . Chest Pain      HPI Pt was seen at 1730. Per pt, c/o gradual onset and persistence of constant chest wall "pain" that began last night approximately 1700. Describes the CP as "constant pressure" that began after she took her new medication labetalol last evening. Pt states she has been taking her BP several times per day, every day, for the past few days "and it's been high." Pt has been evaluated by her PMD 2 days ago, and her OB/GYN yesterday. PMD started her on labetalol. Pt also endorses she "got anxious last night" because "I live alone and think I may die at night alone in my house." Denies palpitations, no SOB/cough, no abd pain, no N/V/D, no back pain, no visual changes, no focal motor weakness, no tingling/numbness in extremities, no ataxia, no slurred speech, no facial droop.     Past Medical History  Diagnosis Date  . OSA (obstructive sleep apnea)     s/p UPPP  . Hypertension   . Hyperlipidemia   . Anemia     from heavy menses  . Fibroadenoma of breast   . Diabetes mellitus   . Obesity   . Uterine fibroid    Past Surgical History  Procedure Laterality Date  . Uvulopalatopharyngoplasty    . Tonsillectomy and adenoidectomy    . Breast biopsy    . 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 Heart disease Mother   . Hypertension Mother   . Diabetes Mother   . Hypertension Father   . Hyperlipidemia Father   . Pulmonary embolism Father   . Diabetes Maternal Grandmother   . Stroke Maternal Grandmother   . Heart disease Paternal Grandfather   . Hypertension Paternal Grandfather   . Colon cancer Neg Hx   . Breast cancer Other    Social History  Substance Use Topics  . Smoking status: Never Smoker   . Smokeless tobacco: Never Used  . Alcohol Use: 0.0  oz/week    0 Standard drinks or equivalent per week     Comment: socially   OB History    Gravida Para Term Preterm AB TAB SAB Ectopic Multiple Living   0 0 0 0 0 0 0 0 0 0      Review of Systems ROS: Statement: All systems negative except as marked or noted in the HPI; Constitutional: Negative for fever and chills. ; ; Eyes: Negative for eye pain, redness and discharge. ; ; ENMT: Negative for ear pain, hoarseness, nasal congestion, sinus pressure and sore throat. ; ; Cardiovascular: Negative for palpitations, diaphoresis, dyspnea and peripheral edema. ; ; Respiratory: Negative for cough, wheezing and stridor. ; ; Gastrointestinal: Negative for nausea, vomiting, diarrhea, abdominal pain, blood in stool, hematemesis, jaundice and rectal bleeding. . ; ; Genitourinary: Negative for dysuria, flank pain and hematuria. ; ; Musculoskeletal: +CP. Negative for back pain and neck pain. Negative for swelling and trauma.; ; Skin: Negative for pruritus, rash, abrasions, blisters, bruising and skin lesion.; ; Neuro: Negative for headache, lightheadedness and neck stiffness. Negative for weakness, altered level of consciousness , altered mental status, extremity weakness, paresthesias, involuntary movement, seizure and syncope.      Allergies  Amlodipine and Calcium channel blockers  Home Medications   Prior to Admission medications   Medication Sig Start Date End Date Taking? Authorizing Provider  atorvastatin (LIPITOR) 20 MG tablet TAKE 1 TABLET DAILY 02/18/15   Tonia Ghent, MD  diltiazem [facility]) 420 MG 24 hr capsule TAKE 1 CAPSULE DAILY 02/18/15   Tonia Ghent, MD  ferrous sulfate 325 (65 FE) MG EC tablet Take 325 mg by mouth daily with breakfast.    Historical Provider, MD  glipiZIDE (GLUCOTROL) 10 MG tablet TAKE 1 TABLET BY MOUTH EVERY DAY BEFORE BREAKFAST 03/27/14   Tonia Ghent, MD  glucose blood (FORA V30A BLOOD GLUCOSE TEST) test strip Use as instructed 09/24/13   Tonia Ghent, MD   labetalol (NORMODYNE) 100 MG tablet Take 1 tablet (100 mg total) by mouth 2 (two) times daily. 03/04/15   Tonia Ghent, MD  metFORMIN (GLUCOPHAGE) 850 MG tablet Take 1 tablet (850 mg total) by mouth 2 (two) times daily with a meal. 03/05/15   Tonia Ghent, MD  norethindrone-ethinyl estradiol-iron (JUNEL FE 1.5/30) 1.5-30 MG-MCG tablet TAKE 1 ACTIVE TABLET DAILY IN A CONTINUOUS FASHION FOR THREE MONTHS 01/13/15   Osborne Oman, MD  triamterene-hydrochlorothiazide (DYAZIDE) 37.5-25 MG per capsule TAKE 1 CAPSULE EVERY MORNING 10/07/14   Tonia Ghent, MD   BP 132/84 mmHg  Pulse 84  Temp(Src) 99.6 F (37.6 C) (Oral)  Resp 18  SpO2 100%  LMP 12/30/2014 (Approximate) Physical Exam  1735: Physical examination:  Nursing notes reviewed; Vital signs and O2 SAT reviewed;  Constitutional: Well developed, Well nourished, Well hydrated, In no acute distress; Head:  Normocephalic, atraumatic; Eyes: EOMI, PERRL, No scleral icterus; ENMT: Mouth and pharynx normal, Mucous membranes moist; Neck: Supple, Full range of motion, No lymphadenopathy; Cardiovascular: Regular rate and rhythm, No murmur, rub, or gallop; Respiratory: Breath sounds clear & equal bilaterally, No rales, rhonchi, wheezes.  Speaking full sentences with ease, Normal respiratory effort/excursion; Chest: Nontender, Movement normal; Abdomen: Soft, Nontender, Nondistended, Normal bowel sounds; Genitourinary: No CVA tenderness; Extremities: Pulses normal, No tenderness, No edema, No calf edema or asymmetry.; Neuro: AA&Ox3, Major CN grossly intact.  Speech clear. No gross focal motor or sensory deficits in extremities.; Skin: Color normal, Warm, Dry.   ED Course  Procedures (including critical care time) Labs Review   Imaging Review  I have personally reviewed and evaluated these images and lab results as part of my medical decision-making.   EKG Interpretation   Date/Time:  Wednesday March 05 2015 13:40:22 EST Ventricular Rate:   98 PR Interval:  162 QRS Duration: 78 QT Interval:  356 QTC Calculation: 454 R Axis:   -4 Text Interpretation:  Normal sinus rhythm Baseline wander No old tracing  to compare Confirmed by [facility]  MD, Nunzio Cory [phone]) on 03/05/2015 5:31:45  PM      MDM  MDM Reviewed: vitals, nursing note and previous chart Reviewed previous: labs Interpretation: labs, x-ray and ECG     Results for orders placed or performed during the hospital encounter of [id number]  Basic metabolic panel  Result Value Ref Range   Sodium 136 135 - 145 mmol/L   Potassium 4.2 3.5 - 5.1 mmol/L   Chloride 101 101 - 111 mmol/L   CO2 24 22 - 32 mmol/L   Glucose, Bld 214 (H) 65 - 99 mg/dL   BUN 13 6 - 20 mg/dL   Creatinine, Ser 1.03 (H) 0.44 - 1.00 mg/dL   Calcium 9.3 8.9 - 10.3 mg/dL   GFR calc non Af  Amer >60 >60 mL/min   GFR calc Af Amer >60 >60 mL/min   Anion gap 11 5 - 15  CBC  Result Value Ref Range   WBC 7.5 4.0 - 10.5 K/uL   RBC 4.72 3.87 - 5.11 MIL/uL   Hemoglobin 14.5 12.0 - 15.0 g/dL   HCT 43.1 36.0 - 46.0 %   MCV 91.3 78.0 - 100.0 fL   MCH 30.7 26.0 - 34.0 pg   MCHC 33.6 30.0 - 36.0 g/dL   RDW 11.9 11.5 - 15.5 %   Platelets 368 150 - 400 K/uL  I-stat troponin, ED (not at [facility])  Result Value Ref Range   Troponin i, poc 0.00 0.00 - 0.08 ng/mL   Comment 3           Dg Chest 2 View 03/05/2015  CLINICAL DATA:  Chest pressure for 2 days. Hypertension. Initial encounter. EXAM: CHEST  2 VIEW COMPARISON:  None. FINDINGS: The lungs are clear. Heart size is normal. There is no pneumothorax or pleural effusion. No focal bony abnormality. IMPRESSION: Negative chest. Electronically Signed   By: Inge Rise M.D.   On: 03/05/2015 14:54    1745:  Known hyperglycemia, not acidotic with AG 11. BUN/Cr per baseline. Doubt PE as cause for symptoms with low risk [place].  Doubt ACS as cause for symptoms with normal troponin and unchanged EKG from previous after 2 days of constant symptoms.  Long d/w pt and  his wife regarding BP control, including, but not limited to:  Checking home BP monitor at doctor's office to compare readings, not taking BP multiple times per day/every day, control of anxiety, as well as taking meds as prescribed. Pt states she "feels better now" and wants to go home. Dx and testing d/w pt and family.  Questions answered.  Verb understanding, agreeable to d/c home with outpt f/u.   Francine Graven, DO 03/08/15 2329

## 2015-03-05 NOTE — Telephone Encounter (Signed)
Noted, I'll await the ER notes.  Thanks.

## 2015-03-05 NOTE — ED Notes (Signed)
Pt reports episodes of chest pressure over past several days. Denies sob or swelling. Hx of htn and was started on labetalol yesterday.

## 2015-03-05 NOTE — Telephone Encounter (Signed)
Pt waiting on mail order pharmacy and request 14 tabs of metformin to [facility]; advised pt done.

## 2015-03-05 NOTE — Discharge Instructions (Signed)
°Emergency Department Resource Guide °1) Find a Doctor and Pay Out of Pocket °Although you won't have to find out who is covered by your insurance plan, it is a good idea to ask around and get recommendations. You will then need to call the office and see if the doctor you have chosen will accept you as a new patient and what types of options they offer for patients who are self-pay. Some doctors offer discounts or will set up payment plans for their patients who do not have insurance, but you will need to ask so you aren't surprised when you get to your appointment. ° °2) Contact Your Local Health Department °Not all health departments have doctors that can see patients for sick visits, but many do, so it is worth a call to see if yours does. If you don't know where your local health department is, you can check in your phone book. The CDC also has a tool to help you locate your state's health department, and many state websites also have listings of all of their local health departments. ° °3) Find a Walk-in Clinic °If your illness is not likely to be very severe or complicated, you may want to try a walk in clinic. These are popping up all over the country in pharmacies, drugstores, and shopping centers. They're usually staffed by nurse practitioners or physician assistants that have been trained to treat common illnesses and complaints. They're usually fairly quick and inexpensive. However, if you have serious medical issues or chronic medical problems, these are probably not your best option. ° °No Primary Care Doctor: °- Call Health Connect at  832-8000 - they can help you locate a primary care doctor that  accepts your insurance, provides certain services, etc. °- Physician Referral Service- 1-800-533-3463 ° °Chronic Pain Problems: °Organization         Address  Phone   Notes  ° [facility]  (336) 297-2271 Patients need to be referred by their primary care doctor.  ° °Medication  Assistance: °Organization         Address  Phone   Notes  °[place] [facility] [address] °[place], [place] [address] (336) 641-8030 --Must be a resident of [place] °-- Must have NO insurance coverage whatsoever (no Medicaid/ Medicare, etc.) °-- The pt. MUST have a primary care doctor that directs their care regularly and follows them in the community °  °[facility]  (866) 331-1348   °[employer]  (888) 892-1162   ° °Agencies that provide inexpensive medical care: °Organization         Address  Phone   Notes  °[facility]  (336) 832-8035   °[facility]    (336) 832-7272   °[facility] [address] °[place],  [address] (336) 832-4777   °[facility] [address], °[place] (336) 271-4999   °[facility]    (336) 373-0678   °[facility]    (336) 272-1050   °[facility] ° [address], [place] Phone:  (336) 832-4444, Fax:  (336) 832-4440 Hours of Operation:  9 am - 6 pm, M-F.  Also accepts Medicaid/Medicare and self-pay.  °[facility] ° [address],  Phone: (336) 832-3150, Fax: (336) [phone]. Hours of Operation:  8:30 am - 5:30 pm, M-F.  Also accepts Medicaid and self-pay.  °[facility] [place] [address], [place] Phone: (336) 878-6027   °[facility] [address], [place], [place] (336)723-1848, Ext. 123 Mondays & Thursdays: 7-9 AM.  First 15 patients are seen on a first come, first serve basis. °  ° °Medicaid-accepting [place] Providers: ° °Organization         Address  Phone   Notes  °[facility] [address], [place] (336) 641-2100 Also accepts self-pay patients.  °[facility] [address], [place] ° (336) 856-9996   °[facility] [address], [place]  (336) 288-8857   °[facility] [address] [place] [address], [place] (336) 299-7000   °Veita Bland [address], [place]  ° (336) 373-1557 Only accepts [place] Access Medicaid patients after they have their name applied to their card.  ° °Self-Pay (no insurance) in [place]: ° °Organization         Address  Phone   Notes  °Sickle Cell Patients, [facility] [address], [place] (336) 832-1970   °[facility] [address], [place] (336) 832-4400   ° [facility] ° [address], [place] (336) 992-4800   °[facility]/Dr. Osei-Bonsu ° [address] [place] [address], [place] or [address], [place] (336) [phone] Phone number for both [place] and [place] locations is the same.  °[facility] [address], [place] (336) 299-0000   °[facility] [address] [place] [address], [place] or [address] (336) 852-7530 °(336) 878-2260   °[facility] [address], [place] (336) 350-1642, phone; (336) 294-5005, fax Sees patients 1st and 3rd Saturday of every month.  Must not qualify for public or private insurance (i.e. Medicaid, Medicare, [place] Health Choice, Veterans' Benefits) • Household income should be no more than 200% of the poverty level •The clinic cannot treat you if you are pregnant or think you are pregnant • Sexually transmitted diseases are not treated at the clinic.  ° ° °Dental Care: °Organization         Address  Phone  Notes  °[place] [facility] [address], [place] (336) 641-6152 Accepts children up to age 21 who are enrolled in Medicaid or [place] Health Choice; pregnant women with a Medicaid card; and children who have applied for Medicaid or [place] Health Choice, but were declined, whose parents can pay a reduced fee at time of service.  °[place]  [facility] [place]  [address], [place] (336) 641-7733 Accepts children up to age 21 who are enrolled in Medicaid or [place] Health Choice; pregnant women with a Medicaid card; and children who have applied for Medicaid or [place] Health Choice, but were declined, whose parents can pay a reduced fee at time of service.  °[facility] ° [address],  (336) 641-4533 Patients are seen by appointment only. Walk-ins are not accepted. [facility] will see patients 18 years of age and older. °Monday - Tuesday (8am-5pm) °Most Wednesdays (8:30-5pm) °$30 per visit, cash only  °[facility] ° [address], [place] (336) 641-4533 Patients are seen by appointment only. Walk-ins are not accepted. [facility] will see patients 18 years of age and older. °One   Wednesday Evening (Monthly: Volunteer Based).  $30 per visit, cash only  °[facility]  (919) 537-3737 for adults; Children under age 4, call Graduate Pediatric Dentistry at ([phone]) [phone]. Children aged 4-14, please call (919) [phone] to request a pediatric application. ° Dental services are provided in all areas of dental care including fillings, crowns and bridges, complete and partial dentures, implants, gum treatment, root canals, and extractions. Preventive care is also provided. Treatment is provided to both adults and children. °Patients are selected via a lottery and there is often a waiting list. °  °[facility] [address], °[place] ° (336) 763-8833 [email or web address] °  °[facility] [address], [place], [place] (336)723-1848, Ext. 123 Second and Fourth Thursday of each month, opens at 6:30 AM; Clinic ends at 9 AM.  Patients are seen on a first-come first-served basis, and a limited number are seen during each clinic.  ° °[facility] ° [address], [place], [place] (336) 723-7904    Eligibility Requirements °You must have lived in [place], [place], or [place] counties for at least the last three months. °  You cannot be eligible for state or federal sponsored healthcare insurance, including Veterans Administration, Medicaid, or Medicare. °  You generally cannot be eligible for healthcare insurance through your employer.  °  How to apply: °Eligibility screenings are held every Tuesday and Wednesday afternoon from 1:00 pm until 4:00 pm. You do not need an appointment for the interview!  °[facility] [address], [place], [place] 336-631-2330   °[facility]  336-342-8273   °[facility]  336-703-3100   °[facility]  336-570-6415   ° °Behavioral Health Resources in the Community: °Intensive Outpatient Programs °Organization         Address  Phone  Notes  °[facility] [address], [place], [place] 336-878-6098   °[facility] [address], [place], [place] 336-832-9800   °[facility] [address], [place], [place] ° 336-882-2125   °[facility] [address],  °[place], [place] 1-800-853-5163 or 336-641-4981   °Substance Abuse Resources °Organization         Address  Phone  Notes  °[facility]  336-882-2125   °[facility]  336-784-9470   °[facility]  336-285-9073   °[facility]  336-845-3988   °Residential & Outpatient Substance Abuse Program  1-800-659-3381   °Psychological Services °Organization         Address  Phone  Notes  °[facility]  336- 832-9600   °[facility]  336- 378-7881   °[facility] [address], [place] 1-800-853-5163 or 336-641-4981   ° °Mobile Crisis Teams °Organization         Address  Phone  Notes  °[facility], Mobile Crisis Care Unit  1-877-626-1772   °Assertive °[facility] ° [address]  [place], [place] 336-834-9664   °Sharon DeEsch [address] ° [place] 336-554-5454   ° °Self-Help/Support Groups °Organization         Address  Phone             Notes  °[facility] of  - variety of support groups  336- 373-1402 Call for more information  °Narcotics Anonymous (NA), [facility] [address], °[place]  2 meetings at this location  ° °  Residential Treatment Programs Organization         Address  Phone  Notes  [facility] [address],    [place]  1-443-270-5470   [facility]  [address] [place] [id number], [place], [place]   [facility], [place] 763-002-4375 Admissions: 8am-3pm M-F  [facility] [place] [address]    [place], [place] [phone]   [facility] [address] [place], [place], [place]   [facility] [address],  [place], [place]   [facility] Intensive Outpatient [address], [place], [place]   [facility] ([place]  [place], [place] 1-810-461-8573 or 873-015-3258   [facility] [address], [place], [place] Accepts Medicaid  [facility] [address],  [place] 1-(214)800-3977 Substance Abuse/Addiction Treatment   [facility]         Address  Phone  Notes  [facility]  (939) 665-7648   Domenic Schwab, PhD [address] [place], [place]   680-135-3663 or 779-446-9791   [facility] [place], [place] 952-602-8726   [facility] [address], [place], [place] [phone] Insurance/Medicaid/sponsorship through [facility] [address] [place]                                    [place], [place] 331-205-7491 [facility] [address], [place] [phone]    Dr. Adele Schilder  [phone]   [facility] Dept. 1) [address] [place] 2) [place] 3)  [place] [address], [place] [phone]  [phone]   [place] (531) [phone] or ([phone] (After Hours)      Take your usual prescriptions as previously directed.  Take your blood pressure only once per day, either in the morning after you take your medicine(s) or in the evening before you go to bed, only 2 or 3 times per week.  Always sit quietly for at least 15 minutes before taking your blood pressure.  Keep a diary of your blood pressures to show your doctor at your follow up office visit.  Call your regular medical doctor tomorrow morning to schedule a follow up appointment within the next 2 days.  Return to the Emergency Department immediately sooner if worsening.

## 2015-03-05 NOTE — Telephone Encounter (Addendum)
It looks like metformin was already sent locally.  Thanks.

## 2015-03-06 ENCOUNTER — Telehealth: Payer: Self-pay | Admitting: Family Medicine

## 2015-03-06 NOTE — Telephone Encounter (Signed)
Pt aware 1/20 @ 10:15

## 2015-03-06 NOTE — Telephone Encounter (Signed)
Pt called wanting to get an er follow for 03/07/15 She went to [place] 1/18 and was told to follow up in 2 days  Please advise time i can put her in

## 2015-03-06 NOTE — Telephone Encounter (Signed)
10:15 seems likely best spot on the schedule.  Thanks.

## 2015-03-07 ENCOUNTER — Encounter: Payer: Self-pay | Admitting: Family Medicine

## 2015-03-07 ENCOUNTER — Ambulatory Visit (INDEPENDENT_AMBULATORY_CARE_PROVIDER_SITE_OTHER): Payer: Managed Care, Other (non HMO) | Admitting: Family Medicine

## 2015-03-07 VITALS — BP 132/100 | HR 94 | Temp 98.3°F | Wt 221.0 lb

## 2015-03-07 DIAGNOSIS — I10 Essential (primary) hypertension: Secondary | ICD-10-CM

## 2015-03-07 LAB — CYTOLOGY - PAP

## 2015-03-07 NOTE — Progress Notes (Signed)
Pre visit review using our clinic review tool, if applicable. No additional management support is needed unless otherwise documented below in the visit note.  She was worried, anxious, not sleeping well.  BP was up.  Seen the ER with elevated BP.  Started on BB recently.  No ADE on med now.  Chest pressure is better in the meantime.  Not SOB.  Her BP has been improved at home, 120s/80s on some home checks.  She had been able to take labetalol prev w/o ADE, in years past.    Meds, vitals, and allergies reviewed.   ROS: See HPI.  Otherwise, noncontributory.  GEN: nad, alert and oriented HEENT: mucous membranes moist NECK: supple w/o LA CV: rrr PULM: ctab, no inc wob ABD: soft, +bs EXT: no edema

## 2015-03-07 NOTE — Patient Instructions (Signed)
Take your BP cuff to your next appointment to calibrate it.  Check your BP about 2 times a week.  Update me in about 2 weeks (BP and sugar). Take care . Glad to see you.

## 2015-03-07 NOTE — Assessment & Plan Note (Signed)
Not fully controlled but improved.  I doubt that labetalol caused ADE given that she has tolerated it prev.  I question if anxiety vs GERD could have contributed.  She'll continue work on diet and exercise, d/w pt.  She'll update me re: BP and sugar in about 2 weeks. Okay for outpatient f/u.

## 2015-03-10 ENCOUNTER — Telehealth: Payer: Self-pay | Admitting: *Deleted

## 2015-03-10 NOTE — Telephone Encounter (Signed)
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 Please schedule patient for colposcopy.  Please call to inform patient of results and need for appointment.

## 2015-03-10 NOTE — Telephone Encounter (Signed)
Spoke to pt about pap result and the recommendation for Colposcopy, pt scheduled appt for 03-24-15 @ 1:45 with Dr Harolyn Rutherford.

## 2015-03-12 ENCOUNTER — Other Ambulatory Visit: Payer: Self-pay | Admitting: Family Medicine

## 2015-03-18 ENCOUNTER — Encounter (HOSPITAL_COMMUNITY): Payer: Self-pay

## 2015-03-18 ENCOUNTER — Emergency Department (HOSPITAL_COMMUNITY): Payer: Managed Care, Other (non HMO)

## 2015-03-18 ENCOUNTER — Telehealth: Payer: Self-pay | Admitting: Family Medicine

## 2015-03-18 ENCOUNTER — Emergency Department (HOSPITAL_COMMUNITY)
Admission: EM | Admit: 2015-03-18 | Discharge: 2015-03-18 | Disposition: A | Discharge status: Home / Self Care | Payer: Managed Care, Other (non HMO) | Attending: Emergency Medicine | Admitting: Emergency Medicine

## 2015-03-18 ENCOUNTER — Ambulatory Visit (HOSPITAL_COMMUNITY)
Admission: RE | Admit: 2015-03-18 | Discharge: 2015-03-18 | Disposition: A | Discharge status: Home / Self Care | Payer: Managed Care, Other (non HMO) | Source: Ambulatory Visit | Attending: Obstetrics & Gynecology | Admitting: Obstetrics & Gynecology

## 2015-03-18 DIAGNOSIS — Z3202 Encounter for pregnancy test, result negative: Secondary | ICD-10-CM | POA: Insufficient documentation

## 2015-03-18 DIAGNOSIS — Z8669 Personal history of other diseases of the nervous system and sense organs: Secondary | ICD-10-CM | POA: Insufficient documentation

## 2015-03-18 DIAGNOSIS — E785 Hyperlipidemia, unspecified: Secondary | ICD-10-CM | POA: Diagnosis not present

## 2015-03-18 DIAGNOSIS — D649 Anemia, unspecified: Secondary | ICD-10-CM | POA: Diagnosis not present

## 2015-03-18 DIAGNOSIS — E119 Type 2 diabetes mellitus without complications: Secondary | ICD-10-CM | POA: Insufficient documentation

## 2015-03-18 DIAGNOSIS — I10 Essential (primary) hypertension: Secondary | ICD-10-CM | POA: Insufficient documentation

## 2015-03-18 DIAGNOSIS — D259 Leiomyoma of uterus, unspecified: Secondary | ICD-10-CM | POA: Insufficient documentation

## 2015-03-18 DIAGNOSIS — Z79899 Other long term (current) drug therapy: Secondary | ICD-10-CM | POA: Insufficient documentation

## 2015-03-18 DIAGNOSIS — R1012 Left upper quadrant pain: Secondary | ICD-10-CM | POA: Diagnosis present

## 2015-03-18 DIAGNOSIS — Z793 Long term (current) use of hormonal contraceptives: Secondary | ICD-10-CM | POA: Diagnosis not present

## 2015-03-18 DIAGNOSIS — Z853 Personal history of malignant neoplasm of breast: Secondary | ICD-10-CM | POA: Insufficient documentation

## 2015-03-18 DIAGNOSIS — E669 Obesity, unspecified: Secondary | ICD-10-CM | POA: Insufficient documentation

## 2015-03-18 DIAGNOSIS — R140 Abdominal distension (gaseous): Secondary | ICD-10-CM

## 2015-03-18 DIAGNOSIS — Z7984 Long term (current) use of oral hypoglycemic drugs: Secondary | ICD-10-CM | POA: Diagnosis not present

## 2015-03-18 LAB — URINALYSIS, ROUTINE W REFLEX MICROSCOPIC
Bilirubin Urine: NEGATIVE
GLUCOSE, UA: NEGATIVE mg/dL
Hgb urine dipstick: NEGATIVE
KETONES UR: NEGATIVE mg/dL
LEUKOCYTES UA: NEGATIVE
NITRITE: NEGATIVE
PROTEIN: NEGATIVE mg/dL
Specific Gravity, Urine: 1.019 (ref 1.005–1.030)
pH: 6 (ref 5.0–8.0)

## 2015-03-18 LAB — COMPREHENSIVE METABOLIC PANEL
ALT: 55 U/L — AB (ref 14–54)
AST: 40 U/L (ref 15–41)
Albumin: 4.6 g/dL (ref 3.5–5.0)
Alkaline Phosphatase: 39 U/L (ref 38–126)
Anion gap: 11 (ref 5–15)
BILIRUBIN TOTAL: 0.5 mg/dL (ref 0.3–1.2)
BUN: 14 mg/dL (ref 6–20)
CALCIUM: 9.8 mg/dL (ref 8.9–10.3)
CO2: 28 mmol/L (ref 22–32)
CREATININE: 1.34 mg/dL — AB (ref 0.44–1.00)
Chloride: 101 mmol/L (ref 101–111)
GFR calc Af Amer: 58 mL/min — ABNORMAL LOW (ref >60)
GFR, EST NON AFRICAN AMERICAN: 50 mL/min — AB (ref >60)
Glucose, Bld: 172 mg/dL — ABNORMAL HIGH (ref 65–99)
Potassium: 3.9 mmol/L (ref 3.5–5.1)
Sodium: 140 mmol/L (ref 135–145)
TOTAL PROTEIN: 7.8 g/dL (ref 6.5–8.1)

## 2015-03-18 LAB — CBC
HCT: 42.5 % (ref 36.0–46.0)
Hemoglobin: 13.8 g/dL (ref 12.0–15.0)
MCH: 30.5 pg (ref 26.0–34.0)
MCHC: 32.5 g/dL (ref 30.0–36.0)
MCV: 94 fL (ref 78.0–100.0)
PLATELETS: 403 10*3/uL — AB (ref 150–400)
RBC: 4.52 MIL/uL (ref 3.87–5.11)
RDW: 12.2 % (ref 11.5–15.5)
WBC: 7.1 10*3/uL (ref 4.0–10.5)

## 2015-03-18 LAB — I-STAT BETA HCG BLOOD, ED (MC, WL, AP ONLY): I-stat hCG, quantitative: <5 m[IU]/mL (ref <5)

## 2015-03-18 LAB — LIPASE, BLOOD: Lipase: 40 U/L (ref 11–51)

## 2015-03-18 MED ORDER — IOHEXOL 300 MG/ML  SOLN
50.0000 mL | Freq: Once | INTRAMUSCULAR | Status: AC | PRN
  Administered 2015-03-18: 50 mL via ORAL

## 2015-03-18 MED ORDER — DICYCLOMINE HCL 20 MG PO TABS: 20.0000 mg | ORAL_TABLET | Freq: Two times a day (BID) | ORAL | Status: DC

## 2015-03-18 MED ORDER — IOHEXOL 300 MG/ML  SOLN
100.0000 mL | Freq: Once | INTRAMUSCULAR | Status: AC | PRN
  Administered 2015-03-18: 100 mL via INTRAVENOUS

## 2015-03-18 NOTE — ED Provider Notes (Signed)
CSN: [id number]     Arrival date & time 03/18/15  1255 History   First MD Initiated Contact with Patient 03/18/15 1328     Chief Complaint  Patient presents with  . Abdominal Pain     (Consider location/radiation/quality/duration/timing/severity/associated sxs/prior Treatment) HPI   35 year old obese female with history of nonsignificant dependent diabetes, UA fibroid, anemia presenting with complaints of abdominal pain. Patient reports the past week she has been having sensation of bloatedness, discomfort to the left upper quadrant and epigastric region, early satiety, increase belching and crampy abdominal pain. Symptoms worsening at nighttime. Has been waxing waning. She endorse having loose stools but she related to taking metformin. Does have history of urine fibroid which she has a recent ultrasound muscular weakness ago. Patient states that her symptoms progressively worse. She is having difficulty passing gas. She has had persistent loose stools as discussed earlier. Abdominal pain is currently 5 out of 10 radiating across the upper abdomen. No associated fever, chills, headache, chest pain, shortness of breath, productive cough, dysuria, hematuria, or rash. Patient has been using Gas-X without adequate relief. Patient has an intact gallbladder. She denies having any associated increased pain with eating aside from early satiety. She denies alcohol abuse.  Past Medical History  Diagnosis Date  . OSA (obstructive sleep apnea)     s/p UPPP  . Hypertension   . Hyperlipidemia   . Anemia     from heavy menses  . Fibroadenoma of breast   . Diabetes mellitus   . Obesity   . Uterine fibroid    Past Surgical History  Procedure Laterality Date  . Uvulopalatopharyngoplasty    . Tonsillectomy and adenoidectomy    . Breast biopsy    . Adrenalectomy  2013    Right, done at [facility] History  Problem Relation Age of Onset  . Heart disease Mother   . Hypertension Mother   . Diabetes  Mother   . Hypertension Father   . Hyperlipidemia Father   . Pulmonary embolism Father   . Diabetes Maternal Grandmother   . Stroke Maternal Grandmother   . Heart disease Paternal Grandfather   . Hypertension Paternal Grandfather   . Colon cancer Neg Hx   . Breast cancer Other    Social History  Substance Use Topics  . Smoking status: Never Smoker   . Smokeless tobacco: Never Used  . Alcohol Use: 0.0 oz/week    0 Standard drinks or equivalent per week     Comment: socially   OB History    Gravida Para Term Preterm AB TAB SAB Ectopic Multiple Living   0 0 0 0 0 0 0 0 0 0      Review of Systems  All other systems reviewed and are negative.     Allergies  Amlodipine and Calcium channel blockers  Home Medications   Prior to Admission medications   Medication Sig Start Date End Date Taking? Authorizing Provider  atorvastatin (LIPITOR) 20 MG tablet TAKE 1 TABLET DAILY Patient taking differently: Take 20 mg by mouth daily. 02/18/15   Tonia Ghent, MD  diltiazem [facility]) 420 MG 24 hr capsule TAKE 1 CAPSULE DAILY Patient taking differently: Take 420 mg by mouth daily 02/18/15   Tonia Ghent, MD  ferrous sulfate 325 (65 FE) MG EC tablet Take 325 mg by mouth daily with breakfast.    Historical Provider, MD  glipiZIDE (GLUCOTROL) 10 MG tablet TAKE 1 TABLET BY MOUTH EVERY DAY BEFORE BREAKFAST Patient taking  differently: Take 10 mg by mouth daily before breakfast. TAKE 1 TABLET BY MOUTH EVERY DAY BEFORE BREAKFAST 03/27/14   Tonia Ghent, MD  labetalol (NORMODYNE) 100 MG tablet Take 1 tablet (100 mg total) by mouth 2 (two) times daily. 03/04/15   Tonia Ghent, MD  metFORMIN (GLUCOPHAGE) 850 MG tablet Take 1 tablet (850 mg total) by mouth 2 (two) times daily with a meal. 03/05/15   Tonia Ghent, MD  norethindrone-ethinyl estradiol-iron (JUNEL FE 1.5/30) 1.5-30 MG-MCG tablet TAKE 1 ACTIVE TABLET DAILY IN A CONTINUOUS FASHION FOR THREE MONTHS 01/13/15   Osborne Oman, MD   triamterene-hydrochlorothiazide (DYAZIDE) 37.5-25 MG capsule TAKE 1 CAPSULE EVERY       MORNING 03/12/15   Tonia Ghent, MD   BP 136/91 mmHg  Pulse 100  Temp(Src) 98.2 F (36.8 C) (Oral)  Resp 18  Ht 5\' 4"  (1.626 m)  Wt 97.977 kg  BMI 37.06 kg/m2  SpO2 97%  LMP 03/15/2015 Physical Exam  Constitutional: She appears well-developed and well-nourished. No distress.  Obese African-American female appears to be in no acute distress.  HENT:  Head: Atraumatic.  Mouth/Throat: Oropharynx is clear and moist.  Eyes: Conjunctivae are normal.  Neck: Neck supple.  Cardiovascular: Normal rate and regular rhythm.   Pulmonary/Chest: Effort normal and breath sounds normal.  Abdominal: Soft. Bowel sounds are normal. She exhibits no distension. There is tenderness (Tenderness to left upper quadrant and epigastrium on palpation without guarding or rebound tenderness. Negative Murphy sign, no pain at McBurney's point.).  Neurological: She is alert.  Skin: No rash noted.  Psychiatric: She has a normal mood and affect.  Nursing note and vitals reviewed.   ED Course  Procedures (including critical care time) Labs Review Labs Reviewed  COMPREHENSIVE METABOLIC PANEL - Abnormal; Notable for the following:    Glucose, Bld 172 (*)    Creatinine, Ser 1.34 (*)    ALT 55 (*)    GFR calc non Af Amer 50 (*)    GFR calc Af Amer 58 (*)    All other components within normal limits  CBC - Abnormal; Notable for the following:    Platelets 403 (*)    All other components within normal limits  LIPASE, BLOOD  URINALYSIS, ROUTINE W REFLEX MICROSCOPIC (NOT AT [facility])  I-STAT BETA HCG BLOOD, ED ([facility], [facility], [facility] ONLY)    Imaging Review US Transvaginal Non-ob  03/18/2015  CLINICAL DATA:  Uterine fibroids. EXAM: TRANSABDOMINAL AND TRANSVAGINAL ULTRASOUND OF PELVIS TECHNIQUE: Both transabdominal and transvaginal ultrasound examinations of the pelvis were performed. Transabdominal technique was performed for global  imaging of the pelvis including uterus, ovaries, adnexal regions, and pelvic cul-de-sac. It was necessary to proceed with endovaginal exam following the transabdominal exam to visualize the uterus and ovaries. COMPARISON:  12/19/2012. FINDINGS: Uterus Measurements: 17.4 x 12.0 x 14.7 cm. Multiple fibroids are again noted. 6.5 x 5.4 x 4.8 cm fibroid in the anterior body. 6.3 x 6.1 x 5.9 cm fibroid in the fundus. 9.9 x 10.4 x 10.0 cm in the right posterior body. Endometrium Thickness: 3.2 mm.  Small amount of endometrial cavity fluid noted. Right ovary Not visualized. Left ovary Poorly visualized and measuring 2.2 x 1.4 x 1.4 cm Other findings No abnormal free fluid. IMPRESSION: Enlarging uterus with multiple very large fibroids again noted. Electronically Signed   By: Kelliher   On: 03/18/2015 11:01   US Pelvis Complete  03/18/2015  CLINICAL DATA:  Uterine fibroids. EXAM: TRANSABDOMINAL AND  TRANSVAGINAL ULTRASOUND OF PELVIS TECHNIQUE: Both transabdominal and transvaginal ultrasound examinations of the pelvis were performed. Transabdominal technique was performed for global imaging of the pelvis including uterus, ovaries, adnexal regions, and pelvic cul-de-sac. It was necessary to proceed with endovaginal exam following the transabdominal exam to visualize the uterus and ovaries. COMPARISON:  12/19/2012. FINDINGS: Uterus Measurements: 17.4 x 12.0 x 14.7 cm. Multiple fibroids are again noted. 6.5 x 5.4 x 4.8 cm fibroid in the anterior body. 6.3 x 6.1 x 5.9 cm fibroid in the fundus. 9.9 x 10.4 x 10.0 cm in the right posterior body. Endometrium Thickness: 3.2 mm.  Small amount of endometrial cavity fluid noted. Right ovary Not visualized. Left ovary Poorly visualized and measuring 2.2 x 1.4 x 1.4 cm Other findings No abnormal free fluid. IMPRESSION: Enlarging uterus with multiple very large fibroids again noted. Electronically Signed   By: Marcello Moores  Register   On: 03/18/2015 11:01   I have personally reviewed  and evaluated these images and lab results as part of my medical decision-making.   EKG Interpretation None      MDM   Final diagnoses:  LUQ abdominal pain  Abdominal bloating  Uterine leiomyoma, unspecified location    BP 136/91 mmHg  Pulse 100  Temp(Src) 98.2 F (36.8 C) (Oral)  Resp 18  Ht 5\' 4"  (1.626 m)  Wt 97.977 kg  BMI 37.06 kg/m2  SpO2 97%  LMP 03/15/2015   Patient here with complaints of indigestion, feeling bloated, and having left lower quadrant abdominal pain. Pregnancy test is negative, urine shows no signs of urinary tract infection, normal H&H and normal WBC. Normal lipase. Evidence of renal insufficiency with creatinine of 1.34 and GFR 50. She has history of uterine fibroid which is evidenced on her transabdominal ultrasound of pelvis. She has an abdominal and pelvis CT scan today showing no acute finding aside from multiple large fibroids which were noted.  I suspect this significant amount of uterine fibroid can cause patient's sense of bloatedness.  No acute emergent medical condition were identified today. Plan to discharge with Bentyl and outpatient follow-up for further management of her condition. Return precaution discussed.  Domenic Moras, PA-C 03/18/15 [place], MD 03/18/15 2028

## 2015-03-18 NOTE — Telephone Encounter (Signed)
Will await ER notes  

## 2015-03-18 NOTE — Discharge Instructions (Signed)
You have evidence of uterine fibroids based on ultrasound and CT scan. This may contribute to your sensation of feeling bloated. Please follow-up with your OB/GYN no your primary care Dr. for further evaluation of condition. Take Bentyl as prescribed.  Return to ER if you have any concerns.   Uterine Fibroids Uterine fibroids are tissue masses (tumors) that can develop in the womb (uterus). They are also called leiomyomas. This type of tumor is not cancerous (benign) and does not spread to other parts of the body outside of the pelvic area, which is between the hip bones. Occasionally, fibroids may develop in the fallopian tubes, in the cervix, or on the support structures (ligaments) that surround the uterus. You can have one or many fibroids. Fibroids can vary in size, weight, and where they grow in the uterus. Some can become quite large. Most fibroids do not require medical treatment. CAUSES A fibroid can develop when a single uterine cell keeps growing (replicating). Most cells in the human body have a control mechanism that keeps them from replicating without control. SIGNS AND SYMPTOMS Symptoms may include:   Heavy bleeding during your period.  Bleeding or spotting between periods.  Pelvic pain and pressure.  Bladder problems, such as needing to urinate more often (urinary frequency) or urgently.  Inability to reproduce offspring (infertility).  Miscarriages. DIAGNOSIS Uterine fibroids are diagnosed through a physical exam. Your health care provider may feel the lumpy tumors during a pelvic exam. Ultrasonography and an MRI may be done to determine the size, location, and number of fibroids. TREATMENT Treatment may include:  Watchful waiting. This involves getting the fibroid checked by your health care provider to see if it grows or shrinks. Follow your health care provider's recommendations for how often to have this checked.  Hormone medicines. These can be taken by mouth or  given through an intrauterine device (IUD).  Surgery.  Removing the fibroids (myomectomy) or the uterus (hysterectomy).  Removing blood supply to the fibroids (uterine artery embolization). If fibroids interfere with your fertility and you want to become pregnant, your health care provider may recommend having the fibroids removed.  HOME CARE INSTRUCTIONS  Keep all follow-up visits as directed by your health care provider. This is important.  Take medicines only as directed by your health care provider.  If you were prescribed a hormone treatment, take the hormone medicines exactly as directed.  Do not take aspirin, because it can cause bleeding.  Ask your health care provider about taking iron pills and increasing the amount of dark green, leafy vegetables in your diet. These actions can help to boost your blood iron levels, which may be affected by heavy menstrual bleeding.  Pay close attention to your period and tell your health care provider about any changes, such as:  Increased blood flow that requires you to use more pads or tampons than usual per month.  A change in the number of days that your period lasts per month.  A change in symptoms that are associated with your period, such as abdominal cramping or back pain. SEEK MEDICAL CARE IF:  You have pelvic pain, back pain, or abdominal cramps that cannot be controlled with medicines.  You have an increase in bleeding between and during periods.  You soak tampons or pads in a half hour or less.  You feel lightheaded, extra tired, or weak. SEEK IMMEDIATE MEDICAL CARE IF:  You faint.  You have a sudden increase in pelvic pain.   This information is  not intended to replace advice given to you by your health care provider. Make sure you discuss any questions you have with your health care provider.   Document Released: 01/30/2000 Document Revised: 02/22/2014 Document Reviewed: 07/31/2013 Elsevier Interactive Patient  Education [employer].

## 2015-03-18 NOTE — ED Notes (Signed)
Patient c/o left upper abdominal pain and bloating. patien tstates she has been using Gas-x with no relief.

## 2015-03-18 NOTE — Telephone Encounter (Signed)
[facility]     Patient Name: Susan Bridges Initial Comment Caller states she was having some bloating-it is happening more at night- when she lays down- under her breast around rib cage there is pressure and burning. - upper left part of her stomach  DOB: [birth year]      Nurse Assessment  Nurse: Luther Parody, RN, Cheryl Date/Time (Eastern Time): 03/18/2015 11:48:57 AM  Confirm and document reason for call. If symptomatic, describe symptoms. You must click the next button to save text entered. ---Caller states that she has been having abd bloating and pressure/pain in her abd for the last wk. States that it is mostly occurring at night even several hrs after eating. Symptoms are getting worse and are present this am.  Has the patient traveled out of the country within the last 30 days? ---Not Applicable  Does the patient have any new or worsening symptoms? ---Yes  Will a triage be completed? ---Yes  Related visit to physician within the last 2 weeks? ---No  Does the PT have any chronic conditions? (i.e. diabetes, asthma, etc.) ---Yes  List chronic conditions. ---fibroids, diabetes, htn  Did the patient indicate they were pregnant? ---No  Is this a behavioral health or substance abuse call? ---No    Guidelines     Guideline Title Affirmed Question Affirmed Notes   Abdominal Pain - Upper [1] Pain lasts > 10 minutes AND [2] age > 28 AND [3] at least one cardiac risk factor (i.e., hypertension, diabetes, obesity, smoker or strong family history of heart disease)    Final Disposition User   Go to ED Now Luther Parody, RN, [facility] - ED   Disagree/Comply: Comply

## 2015-03-18 NOTE — Telephone Encounter (Signed)
Pt has appt 03/19/15 at 2 pm with Dr Damita Dunnings.   PLEASE NOTE: All timestamps contained within this report are represented as [place] Standard Time. CONFIDENTIALTY NOTICE: This fax transmission is intended only for the addressee. It contains information that is legally privileged, confidential or otherwise protected from use or disclosure. If you are not the intended recipient, you are strictly prohibited from reviewing, disclosing, copying using or disseminating any of this information or taking any action in reliance on or regarding this information. If you have received this fax in error, please notify us immediately by telephone so that we can arrange for its return to [place]. Phone: [phone], Toll-Free: [phone], Fax: [phone] Page: 1 of 2 Call Id: [id number] [place] Patient Name: NAZIYAH DEMELLO Gender: Female DOB: 05-06-1980 Age: 35 Y 3 M 18 D Return Phone Number: [phone] (Primary) Address: City/State/Zip: [place] Client Nina Day - Client Client Site [place] - Day Physician Renford Dills Contact Type Call Call Type Triage / Clinical Relationship To Patient Self Appointment Disposition EMR Appointment Not Necessary Info pasted into Epic Yes Return Phone Number [phone] (Primary) Chief Complaint Abdominal Distention / Mass Initial Comment Caller states she was having some bloating-it is happening more at night- when she lays downunder her breast around rib cage there is pressure and burning. - upper left part of her stomach PreDisposition Call Doctor Nurse Assessment Nurse: Luther Parody, RN, Malachy Mood Date/Time (Eastern Time): 03/18/2015 11:48:57 AM Confirm and document reason for call. If symptomatic, describe symptoms. You must click the next button to save text entered. ---Caller states that she has been having abd bloating  and pressure/pain in her abd for the last wk. States that it is mostly occurring at night even several hrs after eating. Symptoms are getting worse and are present this am. Has the patient traveled out of the country within the last 30 days? ---Not Applicable Does the patient have any new or worsening symptoms? ---Yes Will a triage be completed? ---Yes Related visit to physician within the last 2 weeks? ---No Does the PT have any chronic conditions? (i.e. diabetes, asthma, etc.) ---Yes List chronic conditions. ---fibroids, diabetes, htn Did the patient indicate they were pregnant? ---No Is this a behavioral health or substance abuse call? ---No Guidelines Guideline Title Affirmed Question Affirmed Notes Nurse Date/Time (Eastern Time) Abdominal Pain - Upper [1] Pain lasts > 10 minutes AND [2] age > 22 AND [3] at least one cardiac risk factor (i.e., hypertension, diabetes, obesity, smoker or strong Luther Parody, RN, Malachy Mood 03/18/2015 11:50:51 AM PLEASE NOTE: All timestamps contained within this report are represented as [place] Standard Time. CONFIDENTIALTY NOTICE: This fax transmission is intended only for the addressee. It contains information that is legally privileged, confidential or otherwise protected from use or disclosure. If you are not the intended recipient, you are strictly prohibited from reviewing, disclosing, copying using or disseminating any of this information or taking any action in reliance on or regarding this information. If you have received this fax in error, please notify us immediately by telephone so that we can arrange for its return to [place]. Phone: [phone], Toll-Free: [phone], Fax: [phone] Page: 2 of 2 Call Id: [id number] Guidelines Guideline Title Affirmed Question Affirmed Notes Nurse Date/Time Eilene Ghazi Time) family history of heart disease) Disp. Time Eilene Ghazi Time) Disposition Final User 03/18/2015 11:57:22 AM Go to ED Now Yes Luther Parody, RN,  Erskine Speed Understands: Yes Disagree/Comply:  Comply Care Advice Given Per Guideline GO TO ED NOW: You need to be seen in the Emergency Department. Go to the ER at ___________ [place] now. Drive carefully. CARE ADVICE given per Abdominal Pain, Upper (Adult) guideline. DRIVING: Another adult should drive. Do not delay going to the Emergency Department. If immediate transportation is not available via car or taxi, then the patient should be instructed to call [id number]. After Care Instructions Given Call Event Type User Date / Time Description Referrals [facility] - ED

## 2015-03-19 ENCOUNTER — Ambulatory Visit: Payer: Managed Care, Other (non HMO) | Admitting: Family Medicine

## 2015-03-24 ENCOUNTER — Encounter: Payer: Self-pay | Admitting: Obstetrics & Gynecology

## 2015-03-24 ENCOUNTER — Ambulatory Visit (INDEPENDENT_AMBULATORY_CARE_PROVIDER_SITE_OTHER): Payer: Managed Care, Other (non HMO) | Admitting: Obstetrics & Gynecology

## 2015-03-24 ENCOUNTER — Encounter: Payer: Self-pay | Admitting: *Deleted

## 2015-03-24 VITALS — BP 131/90 | HR 92 | Resp 18 | Ht 64.0 in | Wt 223.0 lb

## 2015-03-24 DIAGNOSIS — Z01812 Encounter for preprocedural laboratory examination: Secondary | ICD-10-CM

## 2015-03-24 DIAGNOSIS — R87810 Cervical high risk human papillomavirus (HPV) DNA test positive: Secondary | ICD-10-CM

## 2015-03-24 LAB — POCT URINE PREGNANCY: Preg Test, Ur: NEGATIVE

## 2015-03-24 NOTE — Progress Notes (Signed)
    GYNECOLOGY CLINIC COLPOSCOPY PROCEDURE NOTE  35 y.o. 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  Of note, she had this same pap smear on 12/18/2013 and had negative colposcocopy and benign ECC.   Discussed role for HPV in cervical dysplasia, need for surveillance.  Patient given informed consent, signed copy in the chart, time out was performed.  Placed in lithotomy position. Cervix viewed with speculum and colposcope after application of acetic acid.   Colposcopy adequate? Yes No visible lesions; no biopsies obtained.  ECC specimen obtained. All specimens were labeled and sent to pathology.  Patient was given post procedure instructions.  Will follow up pathology and manage accordingly.  Routine preventative health maintenance measures emphasized.  Of note, patient's 03/18/15 ultrasound results were discussed with her and a report was printed out for her as per her request.   Verita Schneiders, MD, [place] Attending Obstetrician & Gynecologist, [place] for [facility]

## 2015-03-24 NOTE — Patient Instructions (Signed)

## 2015-03-26 ENCOUNTER — Telehealth: Payer: Self-pay | Admitting: *Deleted

## 2015-03-26 NOTE — Telephone Encounter (Signed)
Called pt, no answer, left message that I was calling about results and Dr Harolyn Rutherford had sent her an email via My Chart, if she had any further questions to call the office.

## 2015-03-26 NOTE — Telephone Encounter (Signed)
-----   Message from Osborne Oman, MD sent at 03/26/2015  2:47 PM EST ----- Benign endocervical biopsy. Will repeat pap in 12 months. Results were released to MyChart and patient was given recommendations as indicated.

## 2015-03-28 ENCOUNTER — Ambulatory Visit: Payer: Managed Care, Other (non HMO) | Admitting: Family Medicine

## 2015-03-31 ENCOUNTER — Encounter: Payer: Self-pay | Admitting: Family Medicine

## 2015-03-31 ENCOUNTER — Ambulatory Visit (INDEPENDENT_AMBULATORY_CARE_PROVIDER_SITE_OTHER): Payer: Managed Care, Other (non HMO) | Admitting: Family Medicine

## 2015-03-31 VITALS — BP 132/78 | HR 101 | Temp 98.4°F | Wt 220.0 lb

## 2015-03-31 DIAGNOSIS — R1012 Left upper quadrant pain: Secondary | ICD-10-CM

## 2015-03-31 MED ORDER — DICYCLOMINE HCL 20 MG PO TABS: 20.0000 mg | ORAL_TABLET | Freq: Three times a day (TID) | ORAL | Status: DC

## 2015-03-31 MED ORDER — FERROUS SULFATE 325 (65 FE) MG PO TBEC: 325.0000 mg | DELAYED_RELEASE_TABLET | Freq: Every day | ORAL | Status: DC

## 2015-03-31 NOTE — Progress Notes (Signed)
Pre visit review using our clinic review tool, if applicable. No additional management support is needed unless otherwise documented below in the visit note.  To ER with LUQ pain, CT w/o acute changes, lipase wnl.  Sent home on bentyl with improvement in bloating and LUQ pain.  No blood in stool.  No fevers.  Off bentyl, sx returned.  D/w pt re: ER course and labs/imaging.   She has been working on diet and weight with AM sugars ~120, lower later in the day before other meals.    Meds, vitals, and allergies reviewed.   ROS: See HPI.  Otherwise, noncontributory.  GEN: nad, alert and oriented HEENT: mucous membranes moist NECK: supple w/o LA CV: rrr.   PULM: ctab, no inc wob ABD: soft, +bs, not ttp EXT: no edema

## 2015-03-31 NOTE — Patient Instructions (Signed)
Take bentyl up to 4 times a day if needed.  When better, gradually cut back on possibly offending food groups- dairy vs spicy foods, etc.  I would make one change at a time.  Take care. Glad to see you.

## 2015-04-01 DIAGNOSIS — R1012 Left upper quadrant pain: Secondary | ICD-10-CM | POA: Insufficient documentation

## 2015-04-01 NOTE — Assessment & Plan Note (Signed)
And bloating, improved with bentyl, restart med.  Likely either colonic spasm or IBS but neither would show up on routine testing or labs.  D/w pt.  She'll work on diet changes after restarting bentyl to see if she can identify a trigger.  No sign of ominous dx.  She agrees.

## 2015-04-08 ENCOUNTER — Other Ambulatory Visit: Payer: Self-pay | Admitting: *Deleted

## 2015-04-08 MED ORDER — GLUCOSE BLOOD VI STRP: ORAL_STRIP | Status: DC

## 2015-05-13 ENCOUNTER — Other Ambulatory Visit: Payer: Self-pay

## 2015-05-13 MED ORDER — GLIPIZIDE 10 MG PO TABS: ORAL_TABLET | ORAL | Status: DC

## 2015-05-13 NOTE — Telephone Encounter (Signed)
Pt left v/m requesting refill glipizide to [facility]. Last seen 03/03/15. Refilled # 90 x 1 per protocol. Pt notified done.

## 2015-06-03 ENCOUNTER — Other Ambulatory Visit: Payer: Managed Care, Other (non HMO)

## 2015-07-29 ENCOUNTER — Other Ambulatory Visit: Payer: Self-pay | Admitting: Family Medicine

## 2015-07-29 NOTE — Telephone Encounter (Signed)
Electronic refill request. No show Lab 06/03/15.  Is it okay to refill?

## 2015-07-30 NOTE — Telephone Encounter (Signed)
Pt called to ck on refill status; advised refill sent to pharmacy as requested and pt needs to schedule labs and OV. Pt voiced understanding and will cb to schedule appts.

## 2015-07-30 NOTE — Telephone Encounter (Signed)
Sent.  Due for labs and OV.  Thanks.

## 2015-09-11 ENCOUNTER — Other Ambulatory Visit (INDEPENDENT_AMBULATORY_CARE_PROVIDER_SITE_OTHER): Payer: Managed Care, Other (non HMO)

## 2015-09-11 DIAGNOSIS — E119 Type 2 diabetes mellitus without complications: Secondary | ICD-10-CM

## 2015-09-11 LAB — HEMOGLOBIN A1C: HEMOGLOBIN A1C: 7.3 % — AB (ref 4.6–6.5)

## 2015-09-16 ENCOUNTER — Encounter: Payer: Self-pay | Admitting: Family Medicine

## 2015-09-16 ENCOUNTER — Ambulatory Visit (INDEPENDENT_AMBULATORY_CARE_PROVIDER_SITE_OTHER): Payer: Managed Care, Other (non HMO) | Admitting: Family Medicine

## 2015-09-16 DIAGNOSIS — E119 Type 2 diabetes mellitus without complications: Secondary | ICD-10-CM | POA: Diagnosis not present

## 2015-09-16 NOTE — Progress Notes (Signed)
Pre visit review using our clinic review tool, if applicable. No additional management support is needed unless otherwise documented below in the visit note. 

## 2015-09-16 NOTE — Patient Instructions (Signed)
Keep working on [employer].  Start walking.  Recheck in about 3 months, labs ahead of time.  Take care.  Glad to see you.  Update me as needed.

## 2015-09-16 NOTE — Progress Notes (Signed)
She continues with sig work demands.  D/w pt.   DM2.  She is going through a DM2 mgmt program at work, meeting with staff there.  A1c better, not at goal, but better.  She has set a goal of dec soda down to 1-2 per week and start exercising 1-2 times per week.  She has a cousin who can walk with her.  No ADE on meds. No feet sx.    She ran out of iron, can restart, d/w pt.    Meds, vitals, and allergies reviewed.   ROS: Per HPI unless specifically indicated in ROS section   GEN: nad, alert and oriented HEENT: mucous membranes moist NECK: supple w/o LA CV: rrr. PULM: ctab, no inc wob ABD: soft, +bs EXT: no edema SKIN: no acute rash

## 2015-09-17 NOTE — Assessment & Plan Note (Signed)
A1c improve, no change in meds. If another med added/needed, then [place] likely would be useful, but not added at this point.  Continue work on diet and exercise.  D/w pt.  He agrees.  Update me as needed.

## 2015-09-23 ENCOUNTER — Other Ambulatory Visit: Payer: Self-pay | Admitting: Family Medicine

## 2015-10-28 ENCOUNTER — Other Ambulatory Visit: Payer: Self-pay | Admitting: Family Medicine

## 2015-11-12 ENCOUNTER — Other Ambulatory Visit: Payer: Self-pay | Admitting: Family Medicine

## 2015-11-12 NOTE — Telephone Encounter (Signed)
Okay to continue.  Sent.  Thanks. 

## 2015-11-12 NOTE — Telephone Encounter (Signed)
Received electronically Last office visit 09/16/15 See allergy/contraindication Okay to refill?

## 2015-11-21 ENCOUNTER — Telehealth: Payer: Self-pay | Admitting: Family Medicine

## 2015-11-21 ENCOUNTER — Other Ambulatory Visit: Payer: Self-pay | Admitting: Family Medicine

## 2015-11-21 DIAGNOSIS — E119 Type 2 diabetes mellitus without complications: Secondary | ICD-10-CM

## 2015-11-21 NOTE — Telephone Encounter (Signed)
I would keep it as a follow up.  I put in orders for labs, including A1c and lipids.  Thanks.

## 2015-11-21 NOTE — Telephone Encounter (Signed)
Patient advised.

## 2015-11-21 NOTE — Telephone Encounter (Signed)
Patient is coming in for a follow up on 11/28/15 and labs on 11/25/15.  Patient wants to make sure her A1c and Lipid profile are done because she has a form that needs to be done for work.  Patient wants to know if Dr.Duncan wants her to have a follow up on 11/28/15 or a physical?

## 2015-11-24 ENCOUNTER — Other Ambulatory Visit: Payer: Self-pay | Admitting: Family Medicine

## 2015-11-25 ENCOUNTER — Other Ambulatory Visit (INDEPENDENT_AMBULATORY_CARE_PROVIDER_SITE_OTHER): Payer: Managed Care, Other (non HMO)

## 2015-11-25 DIAGNOSIS — E119 Type 2 diabetes mellitus without complications: Secondary | ICD-10-CM

## 2015-11-25 LAB — COMPREHENSIVE METABOLIC PANEL
ALBUMIN: 3.8 g/dL (ref 3.5–5.2)
ALT: 18 U/L (ref 0–35)
AST: 15 U/L (ref 0–37)
Alkaline Phosphatase: 41 U/L (ref 39–117)
BILIRUBIN TOTAL: 0.2 mg/dL (ref 0.2–1.2)
BUN: 11 mg/dL (ref 6–23)
CALCIUM: 9.1 mg/dL (ref 8.4–10.5)
CHLORIDE: 103 meq/L (ref 96–112)
CO2: 28 mEq/L (ref 19–32)
CREATININE: 1.04 mg/dL (ref 0.40–1.20)
GFR: 76.65 mL/min (ref >60.00)
Glucose, Bld: 192 mg/dL — ABNORMAL HIGH (ref 70–99)
Potassium: 4.2 mEq/L (ref 3.5–5.1)
Sodium: 139 mEq/L (ref 135–145)
Total Protein: 6.9 g/dL (ref 6.0–8.3)

## 2015-11-25 LAB — MICROALBUMIN / CREATININE URINE RATIO
CREATININE, U: 558.9 mg/dL
MICROALB/CREAT RATIO: 12.3 mg/g (ref 0.0–30.0)
Microalb, Ur: 68.6 mg/dL — ABNORMAL HIGH (ref 0.0–1.9)

## 2015-11-25 LAB — LIPID PANEL
CHOLESTEROL: 88 mg/dL (ref 0–200)
HDL: 27.6 mg/dL — ABNORMAL LOW (ref >39.00)
LDL CALC: 37 mg/dL (ref 0–99)
NonHDL: 60.08
TRIGLYCERIDES: 116 mg/dL (ref 0.0–149.0)
Total CHOL/HDL Ratio: 3
VLDL: 23.2 mg/dL (ref 0.0–40.0)

## 2015-11-25 LAB — HEMOGLOBIN A1C: HEMOGLOBIN A1C: 7.9 % — AB (ref 4.6–6.5)

## 2015-11-28 ENCOUNTER — Ambulatory Visit (INDEPENDENT_AMBULATORY_CARE_PROVIDER_SITE_OTHER): Payer: Managed Care, Other (non HMO) | Admitting: Family Medicine

## 2015-11-28 ENCOUNTER — Encounter: Payer: Self-pay | Admitting: Family Medicine

## 2015-11-28 VITALS — BP 128/76 | HR 85 | Temp 98.2°F | Ht 64.0 in | Wt 214.8 lb

## 2015-11-28 DIAGNOSIS — Z23 Encounter for immunization: Secondary | ICD-10-CM

## 2015-11-28 DIAGNOSIS — E119 Type 2 diabetes mellitus without complications: Secondary | ICD-10-CM

## 2015-11-28 MED ORDER — GLIPIZIDE 10 MG PO TABS: ORAL_TABLET | ORAL | 3 refills | Status: DC

## 2015-11-28 NOTE — Patient Instructions (Addendum)
Recheck A1c and MALB in about 3 months before a visit.  Inc glucotrol in the meantime.  Take care.  Glad to see you.

## 2015-11-28 NOTE — Progress Notes (Signed)
Diabetes:  Using medications without difficulties:yes Not checking sugar frequently.   A1c and MALB d/w pt.   She is working on diet and exercise.  She was promoted at work.    Meds, vitals, and allergies reviewed.   ROS: Per HPI unless specifically indicated in ROS section   GEN: nad, alert and oriented HEENT: mucous membranes moist NECK: supple w/o LA CV: rrr. PULM: ctab, no inc wob EXT: no edema SKIN: no acute rash  Diabetic foot exam: Normal inspection No skin breakdown Small callus noted on R lateral foot near distal 5th MT.   Normal DP pulses Normal sensation to light touch and monofilament Nails normal

## 2015-11-28 NOTE — Progress Notes (Signed)
Pre visit review using our clinic review tool, if applicable. No additional management support is needed unless otherwise documented below in the visit note. 

## 2015-11-30 NOTE — Assessment & Plan Note (Addendum)
>  25 minutes spent in face to face time with patient, >50% spent in counselling or coordination of care.  Detailed conversation with patient. Her A1c is not at goal. Increase Glucotrol, with extra 5 mg at night. Recheck A1c and microalbumin in about 3 months. I did not start an ACE inhibitor at this point. The patient still has the possibility of pregnancy, considering pregnancy at some point. She should not be on an ACE inhibitor if there is a chance of her getting pregnant. Discussed with patient. She realizes that ACE inhibitor's are routine treatment for microalbuminuria. At this point she will work on her diet as she can, use to increase dose of Glucotrol, recheck labs in about 3 months. She agrees. Foot care d/w pt.

## 2015-12-02 ENCOUNTER — Other Ambulatory Visit: Payer: Self-pay

## 2015-12-02 ENCOUNTER — Other Ambulatory Visit: Payer: Self-pay | Admitting: *Deleted

## 2015-12-02 MED ORDER — DILTIAZEM HCL ER BEADS 420 MG PO CP24: 420.0000 mg | ORAL_CAPSULE | Freq: Every day | ORAL | 0 refills | Status: DC

## 2015-12-02 MED ORDER — ONETOUCH DELICA LANCETS 33G MISC: 3 refills | Status: DC

## 2015-12-02 MED ORDER — GLUCOSE BLOOD VI STRP: ORAL_STRIP | 3 refills | Status: DC

## 2015-12-02 MED ORDER — DILTIAZEM HCL ER BEADS 420 MG PO CP24: 420.0000 mg | ORAL_CAPSULE | Freq: Every day | ORAL | 3 refills | Status: DC

## 2015-12-02 NOTE — Telephone Encounter (Signed)
Pt spoke with [facility] this morning and did not receive diltiazem refill on 11/12/15. Resent and # 15 sent to [facility]. Pt voiced understanding.

## 2015-12-03 NOTE — Telephone Encounter (Signed)
Pt going out of town to [place] this afternoon and [facility] not have dilt[facility]em 420 mg in stock; I tried [place] and [facility] in [place] and no one has in stock; [facility] and get [facility]04/15. Pt will ck with [facility] to see if has med in stock or if can order and will have transferred script from [place]. Pt will cb if needed.

## 2015-12-04 ENCOUNTER — Other Ambulatory Visit: Payer: Self-pay | Admitting: *Deleted

## 2015-12-04 DIAGNOSIS — N920 Excessive and frequent menstruation with regular cycle: Secondary | ICD-10-CM

## 2015-12-04 DIAGNOSIS — Z3041 Encounter for surveillance of contraceptive pills: Secondary | ICD-10-CM

## 2015-12-04 MED ORDER — NORETHIN ACE-ETH ESTRAD-FE 1.5-30 MG-MCG PO TABS: ORAL_TABLET | ORAL | 3 refills | Status: DC

## 2015-12-05 ENCOUNTER — Other Ambulatory Visit: Payer: Self-pay | Admitting: *Deleted

## 2015-12-05 DIAGNOSIS — Z3041 Encounter for surveillance of contraceptive pills: Secondary | ICD-10-CM

## 2015-12-05 DIAGNOSIS — N920 Excessive and frequent menstruation with regular cycle: Secondary | ICD-10-CM

## 2015-12-05 MED ORDER — NORETHIN ACE-ETH ESTRAD-FE 1.5-30 MG-MCG PO TABS: ORAL_TABLET | ORAL | 3 refills | Status: DC

## 2015-12-09 ENCOUNTER — Telehealth: Payer: Self-pay

## 2015-12-09 ENCOUNTER — Encounter: Payer: Self-pay | Admitting: Family Medicine

## 2015-12-09 NOTE — Telephone Encounter (Signed)
Form Re-faxed along with recent labs.  Patient advised.

## 2015-12-09 NOTE — Telephone Encounter (Signed)
Patient calls to inform us that her employer has not received her employee wellness forms including her lab work from ov on 10/13.  We do have confirmation that forms were faxed by Margarite Gouge, CMA on 11/28/15; however, it is unclear whether a copy of the labs were also sent.    Please review and determine need to resend with copy of lab work.  Patient would like an update on status.  Thanks.

## 2015-12-22 ENCOUNTER — Other Ambulatory Visit: Payer: Self-pay | Admitting: *Deleted

## 2015-12-23 ENCOUNTER — Other Ambulatory Visit: Payer: Self-pay

## 2015-12-23 MED ORDER — ATORVASTATIN CALCIUM 20 MG PO TABS: 20.0000 mg | ORAL_TABLET | Freq: Every day | ORAL | 0 refills | Status: DC

## 2015-12-23 MED ORDER — ATORVASTATIN CALCIUM 20 MG PO TABS: 20.0000 mg | ORAL_TABLET | Freq: Every day | ORAL | 1 refills | Status: DC

## 2015-12-23 MED ORDER — LABETALOL HCL 100 MG PO TABS: ORAL_TABLET | ORAL | 2 refills | Status: DC

## 2015-12-23 MED ORDER — GLIPIZIDE 10 MG PO TABS: ORAL_TABLET | ORAL | 3 refills | Status: DC

## 2015-12-24 ENCOUNTER — Other Ambulatory Visit: Payer: Self-pay

## 2015-12-24 MED ORDER — LABETALOL HCL 100 MG PO TABS: ORAL_TABLET | ORAL | 1 refills | Status: DC

## 2015-12-24 NOTE — Telephone Encounter (Signed)
Pt left v/m requesting 90 day refill for labetalol to [facility],. Notified pt per v/m (DPR approved) that refill was done as requested.

## 2015-12-25 ENCOUNTER — Other Ambulatory Visit: Payer: Self-pay | Admitting: *Deleted

## 2016-01-20 LAB — HM DIABETES EYE EXAM

## 2016-01-21 ENCOUNTER — Other Ambulatory Visit: Payer: Self-pay | Admitting: Family Medicine

## 2016-01-21 NOTE — Telephone Encounter (Signed)
Refilled 12/23/15 #90/1 refill, sent to [facility] Left message for patient to call back, need to know which pharmacy she is using since this has already been refilled.

## 2016-01-26 NOTE — Telephone Encounter (Signed)
Spoke with patient and she has received the mail order medication.

## 2016-01-27 ENCOUNTER — Encounter: Payer: Self-pay | Admitting: Family Medicine

## 2016-02-02 ENCOUNTER — Other Ambulatory Visit: Payer: Self-pay | Admitting: Family Medicine

## 2016-02-03 ENCOUNTER — Telehealth: Payer: Self-pay

## 2016-02-03 NOTE — Telephone Encounter (Signed)
Called patient multiple times, her name was on the January 2018 wait list for our office to schedule her yearly exam, call patient, no answer, left voicemail, no returned call. Postcard mailed to home address, patient named removed from wait list.

## 2016-03-01 ENCOUNTER — Other Ambulatory Visit: Payer: Self-pay | Admitting: Family Medicine

## 2016-03-01 DIAGNOSIS — E119 Type 2 diabetes mellitus without complications: Secondary | ICD-10-CM

## 2016-03-02 ENCOUNTER — Other Ambulatory Visit (INDEPENDENT_AMBULATORY_CARE_PROVIDER_SITE_OTHER): Payer: Managed Care, Other (non HMO)

## 2016-03-02 DIAGNOSIS — E119 Type 2 diabetes mellitus without complications: Secondary | ICD-10-CM | POA: Diagnosis not present

## 2016-03-02 LAB — MICROALBUMIN / CREATININE URINE RATIO
Creatinine,U: 518.1 mg/dL
MICROALB/CREAT RATIO: 7.8 mg/g (ref 0.0–30.0)
Microalb, Ur: 40.3 mg/dL — ABNORMAL HIGH (ref 0.0–1.9)

## 2016-03-02 LAB — HEMOGLOBIN A1C: Hgb A1c MFr Bld: 7.8 % — ABNORMAL HIGH (ref 4.6–6.5)

## 2016-03-03 ENCOUNTER — Other Ambulatory Visit: Payer: Self-pay | Admitting: Family Medicine

## 2016-03-04 ENCOUNTER — Ambulatory Visit: Payer: Managed Care, Other (non HMO) | Admitting: Family Medicine

## 2016-03-11 ENCOUNTER — Ambulatory Visit (INDEPENDENT_AMBULATORY_CARE_PROVIDER_SITE_OTHER): Payer: Managed Care, Other (non HMO) | Admitting: Family Medicine

## 2016-03-11 ENCOUNTER — Encounter: Payer: Self-pay | Admitting: Family Medicine

## 2016-03-11 VITALS — BP 132/86 | HR 80 | Temp 98.6°F | Wt 210.5 lb

## 2016-03-11 DIAGNOSIS — E1129 Type 2 diabetes mellitus with other diabetic kidney complication: Secondary | ICD-10-CM | POA: Diagnosis not present

## 2016-03-11 DIAGNOSIS — I10 Essential (primary) hypertension: Secondary | ICD-10-CM | POA: Diagnosis not present

## 2016-03-11 DIAGNOSIS — R809 Proteinuria, unspecified: Secondary | ICD-10-CM | POA: Diagnosis not present

## 2016-03-11 MED ORDER — LISINOPRIL 10 MG PO TABS
10.0000 mg | ORAL_TABLET | Freq: Every day | ORAL | 3 refills | Status: DC
Start: 2016-03-11 — End: 2017-02-23

## 2016-03-11 NOTE — Progress Notes (Signed)
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  She is busy with work.   Diabetes:  Using medications without difficulties:yes Hypoglycemic episodes: only if prolonged fasting.   Hyperglycemic episodes:no sx Feet problems:no Blood Sugars averaging: not checked often.  eye exam within last year: yes A1c 7.8, MALB pos but lower than prev.  D/w pt about ACE use.  On OCPs currently, no plan for pregnancy at this point, d/w pt.  Weight is down in the meantime.  She has been going to a trainer on the weekend.    PMH and SH reviewed  Meds, vitals, and allergies reviewed.   ROS: Per HPI unless specifically indicated in ROS section   GEN: nad, alert and oriented HEENT: mucous membranes moist NECK: supple w/o LA CV: rrr. PULM: ctab, no inc wob ABD: soft, +bs EXT: no edema SKIN: no acute rash  Diabetic foot exam: Normal inspection No skin breakdown No calluses  Normal DP pulses Normal sensation to light touch and monofilament Nails normal

## 2016-03-11 NOTE — Patient Instructions (Signed)
Add back lisinopril, recheck labs in about 2 weeks after the med start.   Update me about your BP in about 2-3 weeks.   Update me about your sugars at the same time.   We'll go from there.  Take care.  Glad to see you.  Plan on recheck in about 3 months.

## 2016-03-12 NOTE — Assessment & Plan Note (Signed)
A1c 7.8, MALB pos but lower than prev.  D/w pt about ACE use.  On OCPs currently, no plan for pregnancy at this point, d/w pt.  Restart ACE.  We may be able to taper CCB later on.   She'll check sugar and BP and update me with recheck BMET in a few weeks.  We'll go from there.  See AVS.   All d/w pt.  >25 minutes spent in face to face time with patient, >50% spent in counselling or coordination of care.  D/w pt about diet and exercise.

## 2016-03-29 ENCOUNTER — Ambulatory Visit: Payer: Managed Care, Other (non HMO) | Admitting: Obstetrics & Gynecology

## 2016-04-01 ENCOUNTER — Other Ambulatory Visit (INDEPENDENT_AMBULATORY_CARE_PROVIDER_SITE_OTHER): Payer: Managed Care, Other (non HMO)

## 2016-04-01 DIAGNOSIS — I10 Essential (primary) hypertension: Secondary | ICD-10-CM

## 2016-04-01 LAB — BASIC METABOLIC PANEL
BUN: 15 mg/dL (ref 6–23)
CALCIUM: 9.3 mg/dL (ref 8.4–10.5)
CO2: 25 meq/L (ref 19–32)
CREATININE: 1 mg/dL (ref 0.40–1.20)
Chloride: 103 mEq/L (ref 96–112)
GFR: 80.05 mL/min (ref >60.00)
Glucose, Bld: 174 mg/dL — ABNORMAL HIGH (ref 70–99)
Potassium: 3.9 mEq/L (ref 3.5–5.1)
SODIUM: 136 meq/L (ref 135–145)

## 2016-04-06 ENCOUNTER — Ambulatory Visit (INDEPENDENT_AMBULATORY_CARE_PROVIDER_SITE_OTHER): Payer: Managed Care, Other (non HMO) | Admitting: Obstetrics & Gynecology

## 2016-04-06 ENCOUNTER — Encounter: Payer: Self-pay | Admitting: Obstetrics & Gynecology

## 2016-04-06 VITALS — BP 127/93 | HR 91 | Resp 18 | Ht 64.0 in | Wt 212.0 lb

## 2016-04-06 DIAGNOSIS — Z01419 Encounter for gynecological examination (general) (routine) without abnormal findings: Secondary | ICD-10-CM

## 2016-04-06 DIAGNOSIS — N63 Unspecified lump in unspecified breast: Secondary | ICD-10-CM

## 2016-04-06 DIAGNOSIS — R3915 Urgency of urination: Secondary | ICD-10-CM | POA: Insufficient documentation

## 2016-04-06 DIAGNOSIS — Z01411 Encounter for gynecological examination (general) (routine) with abnormal findings: Secondary | ICD-10-CM

## 2016-04-06 DIAGNOSIS — Z0000 Encounter for general adult medical examination without abnormal findings: Secondary | ICD-10-CM

## 2016-04-06 DIAGNOSIS — D259 Leiomyoma of uterus, unspecified: Secondary | ICD-10-CM

## 2016-04-06 NOTE — Progress Notes (Signed)
Subjective:     Susan Bridges is a 36 y.o. female here for a routine exam.  Current complaints: large fibroids, urinary urgency.  Pt says the menses are well controlled on OCPs.  Pt still would like to have children but not in relationship currently.   Pt still feels breast mass on left (where she had fibroadenoma removed).   Gynecologic History Patient's last menstrual period was 03/16/2016. Contraception: abstinence Last Pap: 2017. Results were: nml cytology and +HRHPV, colpo done.(see note) Last mammogram: several years ago.  Obstetric History OB History  Gravida Para Term Preterm AB Living  0 0 0 0 0 0  SAB TAB Ectopic Multiple Live Births  0 0 0 0           The following portions of the patient's history were reviewed and updated as appropriate: allergies, current medications, past family history, past medical history, past social history, past surgical history and problem list.  Review of Systems Pertinent items noted in HPI and remainder of comprehensive ROS otherwise negative.    Objective:      Vitals:   04/06/16 0903  BP: (!) 127/93  Pulse: 91  Resp: 18  Weight: 212 lb (96.2 kg)  Height: 5\' 4"  (1.626 m)   Vitals:  WNL General appearance: alert, cooperative and no distress  HEENT: Normocephalic, without obvious abnormality, atraumatic Eyes: negative Throat: lips, mucosa, and tongue normal; teeth and gums normal  Respiratory: Clear to auscultation bilaterally  CV: Regular rate and rhythm  Breasts:  Normal appearance, no masses or tenderness, no nipple retraction or dimpling  GI: Soft, non-tender; bowel sounds normal; fibroids are 2 cm above umbilicus and firm.  GU: External Genitalia:  Tanner V, no lesion Urethra:  No prolapse   Vagina: Pink, normal rugae, no blood or discharge  Cervix: No CMT, no lesion  Uterus:  Normal size and contour, non tender  Adnexa: Normal, no masses, non tender  Musculoskeletal: No edema, redness or tenderness in the calves or  thighs  Skin: No lesions or rash  Lymphatic: Axillary adenopathy: none     Psychiatric: Normal mood and behavior   Vitals:   04/06/16 0903  BP: (!) 127/93  Pulse: 91  Resp: 18  Weight: 212 lb (96.2 kg)  Height: 5\' 4"  (1.626 m)    Assessment:    Healthy female exam.   Large uterine fibroids Self reported breast mass on left Prior pap with HPV   Plan:   Pap with HPV  Fibroids--Rpt [place].  Look for ureteral jets to make sure fibroids are not blocking ureters.  Pt will need to make decision about long term management of fibroids:  Myomectomy, [place], lupron, depo, hysterectomy.  Pt is now 57 with DM and HTN.  Continuing OCPs is relative contraindication.  Breast mass-diagnostic mammogram  RTC 2 weeks to discuss plan with Dr. Loni Muse.

## 2016-04-06 NOTE — Patient Instructions (Signed)
Uterine Artery Embolization for Fibroids Uterine artery embolization is a nonsurgical treatment to shrink fibroids. A thin plastic tube (catheter) is used to inject material that blocks off the blood supply to the fibroid, which causes the fibroid to shrink. LET [facility] CARE PROVIDER KNOW ABOUT:  Any allergies you have.  All medicines you are taking, including vitamins, herbs, eye drops, creams, and over-the-counter medicines.  Previous problems you or members of your family have had with the use of anesthetics.  Any blood disorders you have.  Previous surgeries you have had.  Medical conditions you have. RISKS AND COMPLICATIONS  Injury to the uterus from decreased blood supply  Infection.  Blood infection (septicemia).  Lack of menstrual periods (amenorrhea).  Death of tissue cells (necrosis) around your bladder or vulva.  Development of a hole between organs or from an organ to the surface of your skin (fistula).  Blood clot in the legs (deep vein thrombosis) or lung (pulmonary embolus). BEFORE THE PROCEDURE  Ask your health care provider about changing or stopping your regular medicines.   Do not take aspirin or blood thinners (anticoagulants) for 1 week before the surgery or as directed by your health care provider.  Do not eat or drink anything for 8 hours before the surgery or as directed by your health care provider.   Empty your bladder before the procedure begins. PROCEDURE   An IV tube will be placed into one of your veins. This will be used to give you a sedative and pain medication (conscious sedation).  You will be given a medicine that numbs the area (local anesthetic).  A small cut will be made in your groin. A catheter is then inserted into the main artery of your leg.  The catheter will be guided through the artery to your uterus. A series of images will be taken while dye is injected through the catheter in your groin. X-rays are taken at the  same time. This is done to provide a road map of the blood supply to your uterus and fibroids.  Tiny plastic spheres, about the size of sand grains, will be injected through the catheter. Metal coils may be used to help block the artery. The particles will lodge in tiny branches of the uterine artery that supplies blood to the fibroids.  The procedure is repeated on the artery that supplies the other side of the uterus.  The catheter is then removed and pressure is held to stop any bleeding. No stitches are needed.  A dressing is then placed over the cut (incision). AFTER THE PROCEDURE  You will be taken to a recovery area where your progress will be monitored until you are awake, stable, and taking fluids well. If there are no other problems, you will then be moved to a regular hospital room.  You will be observed overnight in the hospital.  You will have cramping that should be controlled with pain medication. This information is not intended to replace advice given to you by your health care provider. Make sure you discuss any questions you have with your health care provider. Document Released: 04/19/2005 Document Revised: 11/22/2012 Document Reviewed: 08/17/2012 Elsevier Interactive Patient Education  2017 [employer].

## 2016-04-06 NOTE — Progress Notes (Signed)
Annual Exam Last pap 03-04-15

## 2016-04-08 LAB — CYTOLOGY - PAP
DIAGNOSIS: NEGATIVE
HPV (WINDOPATH): NOT DETECTED

## 2016-04-09 ENCOUNTER — Other Ambulatory Visit: Payer: Self-pay | Admitting: Obstetrics & Gynecology

## 2016-04-09 DIAGNOSIS — D259 Leiomyoma of uterus, unspecified: Secondary | ICD-10-CM

## 2016-04-12 ENCOUNTER — Ambulatory Visit
Admission: RE | Admit: 2016-04-12 | Discharge: 2016-04-12 | Disposition: A | Discharge status: Home / Self Care | Payer: Managed Care, Other (non HMO) | Source: Ambulatory Visit | Attending: Obstetrics & Gynecology | Admitting: Obstetrics & Gynecology

## 2016-04-12 DIAGNOSIS — D259 Leiomyoma of uterus, unspecified: Secondary | ICD-10-CM | POA: Diagnosis present

## 2016-04-20 ENCOUNTER — Ambulatory Visit
Admission: RE | Admit: 2016-04-20 | Discharge: 2016-04-20 | Disposition: A | Discharge status: Home / Self Care | Payer: Managed Care, Other (non HMO) | Source: Ambulatory Visit | Attending: Obstetrics & Gynecology | Admitting: Obstetrics & Gynecology

## 2016-04-20 DIAGNOSIS — N63 Unspecified lump in unspecified breast: Secondary | ICD-10-CM

## 2016-04-23 ENCOUNTER — Ambulatory Visit (INDEPENDENT_AMBULATORY_CARE_PROVIDER_SITE_OTHER): Payer: Managed Care, Other (non HMO) | Admitting: Obstetrics & Gynecology

## 2016-04-23 ENCOUNTER — Encounter: Payer: Self-pay | Admitting: Obstetrics & Gynecology

## 2016-04-23 VITALS — BP 118/83 | HR 91 | Ht 64.0 in | Wt 211.0 lb

## 2016-04-23 DIAGNOSIS — D259 Leiomyoma of uterus, unspecified: Secondary | ICD-10-CM

## 2016-04-23 DIAGNOSIS — N920 Excessive and frequent menstruation with regular cycle: Secondary | ICD-10-CM | POA: Diagnosis not present

## 2016-04-23 MED ORDER — MEGESTROL ACETATE 40 MG PO TABS: 40.0000 mg | ORAL_TABLET | Freq: Two times a day (BID) | ORAL | 5 refills | Status: DC

## 2016-04-23 NOTE — Progress Notes (Signed)
GYNECOLOGY OFFICE VISIT NOTE  History:  36 y.o. G0 here today for discussion of results from recent ultrasound to evaluate known fibroids.  Reports having pelvic pressure and increased urinary frequency due to fibroids.  Also concerned about continuing OCPs given her HTN that now requires multiple agents.  She denies any abnormal vaginal discharge, bleeding (history of menorrhagia controlled on OCPs) or other concerns.   Past Medical History:  Diagnosis Date  . Anemia    from heavy menses  . Diabetes mellitus   . Fibroadenoma of breast   . Hyperlipidemia   . Hypertension   . Obesity   . OSA (obstructive sleep apnea)    s/p UPPP  . Uterine fibroid     Past Surgical History:  Procedure Laterality Date  . ADRENALECTOMY  2013   Right, done at [facility]  . BREAST BIOPSY    . BREAST EXCISIONAL BIOPSY Left 06/15/2008   benign  . TONSILLECTOMY AND ADENOIDECTOMY    . UVULOPALATOPHARYNGOPLASTY      The following portions of the patient's history were reviewed and updated as appropriate: allergies, current medications, past family history, past medical history, past social history, past surgical history and problem list.   Health Maintenance:  Normal pap and negative HRHPV on 04/06/2016.  Normal mammogram on 04/20/2016 (done for palpable lesion in left breast).   Review of Systems:  Pertinent items noted in HPI and remainder of comprehensive ROS otherwise negative.   Objective:  Physical Exam BP 118/83   Pulse 91   Ht 5\' 4"  (1.626 m)   Wt 211 lb (95.7 kg)   LMP 03/09/2016   BMI 36.22 kg/m  CONSTITUTIONAL: Well-developed, well-nourished female in no acute distress.  HENT:  Normocephalic, atraumatic. External right and left ear normal. Oropharynx is clear and moist EYES: Conjunctivae and EOM are normal. Pupils are equal, round, and reactive to light. No scleral icterus.  NECK: Normal range of motion, supple, no masses SKIN: Skin is warm and dry. No rash noted. Not diaphoretic. No  erythema. No pallor. NEUROLOGIC: Alert and oriented to person, place, and time. Normal reflexes, muscle tone coordination. No cranial nerve deficit noted. PSYCHIATRIC: Normal mood and affect. Normal behavior. Normal judgment and thought content. CARDIOVASCULAR: Normal heart rate noted RESPIRATORY: Effort and breath sounds normal, no problems with respiration noted ABDOMEN: Soft, no distention noted, nontender.  Fibroids are palpated 2 cm above umbilicus and firm. PELVIC: Deferred MUSCULOSKELETAL: Normal range of motion. No edema noted.  Labs and Imaging US Transvaginal Non-ob  Result Date: 04/12/2016 CLINICAL DATA:  Fibroid follow-up. EXAM: TRANSABDOMINAL AND TRANSVAGINAL ULTRASOUND OF PELVIS TECHNIQUE: Both transabdominal and transvaginal ultrasound examinations of the pelvis were performed. Transabdominal technique was performed for global imaging of the pelvis including uterus, ovaries, adnexal regions, and pelvic cul-de-sac. It was necessary to proceed with endovaginal exam following the transabdominal exam to visualize the uterus and ovaries. COMPARISON:  CT 03/18/2015. FINDINGS: Uterus Measurements: 15.6 x 2.4 x 16.0 cm. Multiple large fibroids are noted throughout the uterus with the largest measuring 10.3 cm. Endometrium Thickness: 16 mm.  No focal abnormality visualized. Right ovary Measurements: Not visualized. Left ovary Measurements: Not visualize. Other findings No abnormal free fluid. Bladder is nondistended. Postvoid residual could not be activity determine as the patient could not retain a full bladder. IMPRESSION: 1. Multiple large fibroids are again noted throughout the uterus with the largest measuring 10.3 cm. 2. Air is nondistended. The patient could not maintain a full bladder to determine 80 nectar postvoid residual.  Electronically Signed   By: Marcello Moores  Register   On: 04/12/2016 08:53   US Pelvis Complete  Result Date: 04/12/2016 CLINICAL DATA:  Fibroid follow-up. EXAM:  TRANSABDOMINAL AND TRANSVAGINAL ULTRASOUND OF PELVIS TECHNIQUE: Both transabdominal and transvaginal ultrasound examinations of the pelvis were performed. Transabdominal technique was performed for global imaging of the pelvis including uterus, ovaries, adnexal regions, and pelvic cul-de-sac. It was necessary to proceed with endovaginal exam following the transabdominal exam to visualize the uterus and ovaries. COMPARISON:  CT 03/18/2015. FINDINGS: Uterus Measurements: 15.6 x 2.4 x 16.0 cm. Multiple large fibroids are noted throughout the uterus with the largest measuring 10.3 cm. Endometrium Thickness: 16 mm.  No focal abnormality visualized. Right ovary Measurements: Not visualized. Left ovary Measurements: Not visualize. Other findings No abnormal free fluid. Bladder is nondistended. Postvoid residual could not be activity determine as the patient could not retain a full bladder. IMPRESSION: 1. Multiple large fibroids are again noted throughout the uterus with the largest measuring 10.3 cm. 2. Air is nondistended. The patient could not maintain a full bladder to determine 80 nectar postvoid residual. Electronically Signed   By: Marcello Moores  Register   On: 04/12/2016 08:53   US Pelvis Limited  Result Date: 04/12/2016 CLINICAL DATA:  Fibroid follow-up. EXAM: TRANSABDOMINAL AND TRANSVAGINAL ULTRASOUND OF PELVIS TECHNIQUE: Both transabdominal and transvaginal ultrasound examinations of the pelvis were performed. Transabdominal technique was performed for global imaging of the pelvis including uterus, ovaries, adnexal regions, and pelvic cul-de-sac. It was necessary to proceed with endovaginal exam following the transabdominal exam to visualize the uterus and ovaries. COMPARISON:  CT 03/18/2015. FINDINGS: Uterus Measurements: 15.6 x 2.4 x 16.0 cm. Multiple large fibroids are noted throughout the uterus with the largest measuring 10.3 cm. Endometrium Thickness: 16 mm.  No focal abnormality visualized. Right ovary  Measurements: Not visualized. Left ovary Measurements: Not visualize. Other findings No abnormal free fluid. Bladder is nondistended. Postvoid residual could not be activity determine as the patient could not retain a full bladder. IMPRESSION: 1. Multiple large fibroids are again noted throughout the uterus with the largest measuring 10.3 cm. 2. Air is nondistended. The patient could not maintain a full bladder to determine 80 nectar postvoid residual. Electronically Signed   By: Marcello Moores  Register   On: 04/12/2016 08:53   US Breast Ltd Uni Left Inc Axilla  Result Date: 04/20/2016 CLINICAL DATA:  36 year old female with a persistent palpable abnormality in the left breast at site of prior excisional biopsy in 2010. EXAM: 2D DIGITAL DIAGNOSTIC BILATERAL MAMMOGRAM WITH CAD AND ADJUNCT TOMO LEFT BREAST ULTRASOUND COMPARISON:  Previous exam(s). ACR Breast Density Category c: The breast tissue is heterogeneously dense, which may obscure small masses. FINDINGS: No suspicious masses or calcifications are seen in either breast. Spot compression CC tomograms were performed over the area of palpable concern in the outer left breast with only extremely dense fibroglandular tissue visualized. Mammographic images were processed with CAD. Physical examination at site of palpable concern in the slightly upper outer left breast reveals generalized mobile thickening. No underlying palpable masses. Targeted ultrasound of the left breast was performed. No suspicious masses or abnormalities are seen, only an area of extremely dense fibroglandular tissue is visualized. IMPRESSION: 1. Extremely dense fibroglandular tissue seen at site of palpable concern in the left breast. 2.  No findings of malignancy in either breast. RECOMMENDATION: Screening mammogram at age 45 unless there are persistent or intervening clinical concerns. (Code:SM-B-40A) I have discussed the findings and recommendations with the patient. Results were  also provided  in writing at the conclusion of the visit. If applicable, a reminder letter will be sent to the patient regarding the next appointment. BI-RADS CATEGORY  2: Benign. Electronically Signed   By: Everlean Alstrom M.D.   On: 04/20/2016 10:15   Mm Diag Breast Tomo Bilateral  Result Date: 04/20/2016 CLINICAL DATA:  36 year old female with a persistent palpable abnormality in the left breast at site of prior excisional biopsy in 2010. EXAM: 2D DIGITAL DIAGNOSTIC BILATERAL MAMMOGRAM WITH CAD AND ADJUNCT TOMO LEFT BREAST ULTRASOUND COMPARISON:  Previous exam(s). ACR Breast Density Category c: The breast tissue is heterogeneously dense, which may obscure small masses. FINDINGS: No suspicious masses or calcifications are seen in either breast. Spot compression CC tomograms were performed over the area of palpable concern in the outer left breast with only extremely dense fibroglandular tissue visualized. Mammographic images were processed with CAD. Physical examination at site of palpable concern in the slightly upper outer left breast reveals generalized mobile thickening. No underlying palpable masses. Targeted ultrasound of the left breast was performed. No suspicious masses or abnormalities are seen, only an area of extremely dense fibroglandular tissue is visualized. IMPRESSION: 1. Extremely dense fibroglandular tissue seen at site of palpable concern in the left breast. 2.  No findings of malignancy in either breast. RECOMMENDATION: Screening mammogram at age 8 unless there are persistent or intervening clinical concerns. (Code:SM-B-40A) I have discussed the findings and recommendations with the patient. Results were also provided in writing at the conclusion of the visit. If applicable, a reminder letter will be sent to the patient regarding the next appointment. BI-RADS CATEGORY  2: Benign. Electronically Signed   By: Everlean Alstrom M.D.   On: 04/20/2016 10:15    Assessment & Plan:  1. Uterine leiomyoma,  unspecified location 2. Menorrhagia with regular cycle Bladder compression secondary to fibroids noted, no mention of effect of ureters.  Last Cr 1.0 on 04/01/16 which is stable for her.  Discussed long term management of fibroids and AUB; recommended switching medication from OCPs to Megace (progestin only) for AUB management and to avoid estrogen effect on HTN.  Patient wants to retain her future fertility for now, declined Depo Lupron, [place] or hysterectomy. Considering myomectomy, had long discussion about this modality. She is unsure for now, information given to her to review at home. Will administer Megace for now, reevaluate in one month.  NSAIDs recommended for pain as needed.  - megestrol (MEGACE) 40 MG tablet; Take 1 tablet (40 mg total) by mouth 2 (two) times daily. Can increase to two tablets twice a day in the event of heavy bleeding  Dispense: 60 tablet; Refill: 5 Please refer to After Visit Summary for other counseling recommendations.   Return in about 4 weeks (around 05/21/2016) for Followup.   Total face-to-face time with patient: 25 minutes. Over 50% of encounter was spent on counseling and coordination of care.   Verita Schneiders, MD, [place] Attending [facility], [facility] for [employer], [place]

## 2016-04-23 NOTE — Patient Instructions (Signed)
Myomectomy Myomectomy is surgery to remove a noncancerous tumor (myoma) from the uterus. Myomas are tumors made up of fibrous tissue. They are often called fibroid tumors. Fibroid tumors can range from the size of a pea to the size of a grapefruit. In a myomectomy, the fibroid tumor is removed without removing the uterus. Because these tumors are rarely cancerous, this surgery is usually done only if the tumor is growing or causing symptoms such as pain, pressure, bleeding, or pain with intercourse. LET YOUR HEALTH CARE PROVIDER KNOW ABOUT:  Any allergies you have.  All medicines you are taking, including vitamins, herbs, eye drops, creams, and over-the-counter medicines.  Previous problems you or members of your family have had with the use of anesthetics.  Any blood disorders you have.  Previous surgeries you have had.  Medical conditions you have. RISKS AND COMPLICATIONS Generally, this is a safe procedure. However, as with any procedure, complications can occur. Possible complications include:  Excessive bleeding.  Infection.  Injury to nearby organs.  Blood clots in the legs, chest, or brain.  Scar tissue on other organs and in the pelvis. This may require another surgery to remove the scar tissue.  BEFORE THE PROCEDURE  Ask your health care provider about changing or stopping your regular medicines. Avoid taking aspirin or blood thinners as directed by your health care provider.  Do not  eat or drink anything after midnight on the night before surgery.  If you smoke, do not  smoke for 2 weeks before the surgery.  Do not  drink alcohol the day before the surgery.  Arrange for someone to drive you home after the procedure or after your hospital stay. Also arrange for someone to help you with activities during your recovery. PROCEDURE You will be given medicine to make you sleep through the procedure (general anesthetic). Any of the following methods may be used to perform  a myomectomy:  Small monitors will be put on your body. They are used to check your heart, blood pressure, and oxygen level.  An IV access tube will be put into one of your veins. Medicine will be able to flow directly into your body through this IV tube.  You might be given a medicine to help you relax (sedative).  You will be given a medicine to make you sleep (general anesthetic). A breathing tube will be placed into your lungs during the procedure.  A thin, flexible tube (catheter) will be inserted into your bladder to collect urine.  Any of the following methods may be used to perform a myomectomy: ? Hysteroscopic myomectomy-This method may be used when the fibroid tumor is inside the cavity of the uterus. A long, thin tube that is like a telescope (hysteroscope) is inserted inside the uterus. A saline solution is put into your uterus. This expands the uterus and allows the surgeon to see the fibroids. Tools are passed through the hysteroscope to remove the fibroid tumor in pieces. ? Laparoscopic myomectomy-A few small cuts (incisions) are made in the lower abdomen. A thin, lighted tube with a tiny camera on the end (laparoscope) is inserted through one of the incisions. This gives the surgeon a good view of the area. The fibroid tumor is removed through the other incisions. The incisions are then closed with stitches (sutures) or staples. ? Abdominal myomectomy-This method is used when the fibroid tumor cannot be removed with a hysteroscope or laparoscope. The surgery is performed through a larger surgical incision in the abdomen. The   tumor is removed through this incision. The incision is closed with sutures or staples. What to expect after the procedure  If you had a laparoscopic or hysteroscopic myomectomy, you may be able to go home the same day, or you may need to stay in the hospital overnight.  If you had an abdominal myomectomy, you may need to stay in the hospital for a few  days.  Your IV access tube and catheter will be removed in 1-2 days.  You may be given medicine for pain or to help you sleep.  You may be given an antibiotic medicine, if needed. This information is not intended to replace advice given to you by your health care provider. Make sure you discuss any questions you have with your health care provider. Document Released: 11/29/2006 Document Revised: 07/10/2015 Document Reviewed: 09/13/2012 Elsevier Interactive Patient Education  2017 [place].    Hysterectomy Information A hysterectomy is a surgery in which your uterus is removed. This surgery may be done to treat various medical problems. After the surgery, you will no longer have menstrual periods. The surgery will also make you unable to become pregnant (sterile). The fallopian tubes and ovaries can be removed (bilateral salpingo-oophorectomy) during this surgery as well. Reasons for a hysterectomy Persistent, abnormal bleeding. Lasting (chronic) pelvic pain or infection. The lining of the uterus (endometrium) starts growing outside the uterus (endometriosis). The endometrium starts growing in the muscle of the uterus (adenomyosis). The uterus falls down into the vagina (pelvic organ prolapse). Noncancerous growths in the uterus (uterine fibroids) that cause symptoms. Precancerous cells. Cervical cancer or uterine cancer. Types of hysterectomies Supracervical hysterectomy-In this type, the top part of the uterus is removed, but not the cervix. Total hysterectomy-The uterus and cervix are removed. Radical hysterectomy-The uterus, the cervix, and the fibrous tissue that holds the uterus in place in the pelvis (parametrium) are removed. Ways a hysterectomy can be performed Abdominal hysterectomy-A large surgical cut (incision) is made in the abdomen. The uterus is removed through this incision. Vaginal hysterectomy-An incision is made in the vagina. The uterus is removed through this  incision. There are no abdominal incisions. Conventional laparoscopic hysterectomy-Three or four small incisions are made in the abdomen. A thin, lighted tube with a camera (laparoscope) is inserted into one of the incisions. Other tools are put through the other incisions. The uterus is cut into small pieces. The small pieces are removed through the incisions, or they are removed through the vagina. Laparoscopically assisted vaginal hysterectomy (LAVH)-Three or four small incisions are made in the abdomen. Part of the surgery is performed laparoscopically and part vaginally. The uterus is removed through the vagina. Robot-assisted laparoscopic hysterectomy-A laparoscope and other tools are inserted into 3 or 4 small incisions in the abdomen. A computer-controlled device is used to give the surgeon a 3D image and to help control the surgical instruments. This allows for more precise movements of surgical instruments. The uterus is cut into small pieces and removed through the incisions or removed through the vagina. What are the risks? Possible complications associated with this procedure include: Bleeding and risk of blood transfusion. Tell your health care provider if you do not want to receive any blood products. Blood clots in the legs or lung. Infection. Injury to surrounding organs. Problems or side effects related to anesthesia. Conversion to an abdominal hysterectomy from one of the other techniques. What to expect after a hysterectomy You will be given pain medicine. You will need to have someone with  you for the first 3-5 days after you go home. You will need to follow up with your surgeon in 2-4 weeks after surgery to evaluate your progress. You may have early menopause symptoms such as hot flashes, night sweats, and insomnia. If you had a hysterectomy for a problem that was not cancer or not a condition that could lead to cancer, then you no longer need Pap tests. However, even if you no  longer need a Pap test, a regular exam is a good idea to make sure no other problems are starting. This information is not intended to replace advice given to you by your health care provider. Make sure you discuss any questions you have with your health care provider. Document Released: 07/28/2000 Document Revised: 07/10/2015 Document Reviewed: 10/09/2012 Elsevier Interactive Patient Education  2017 [place].     Uterine Artery Embolization for Fibroids Uterine artery embolization is a nonsurgical treatment to shrink fibroids. A thin plastic tube (catheter) is used to inject material that blocks off the blood supply to the fibroid, which causes the fibroid to shrink. Tell a health care provider about:  Any allergies you have.  All medicines you are taking, including vitamins, herbs, eye drops, creams, and over-the-counter medicines.  Any problems you or family members have had with anesthetic medicines.  Any blood disorders you have.  Any surgeries you have had.  Any medical conditions you have. What are the risks?  Injury to the uterus from decreased blood supply  Infection.  Blood infection (septicemia).  Lack of menstrual periods (amenorrhea).  Death of tissue cells (necrosis) around your bladder or vulva.  Development of a hole between organs or from an organ to the surface of your skin (fistula).  Blood clot in the legs (deep vein thrombosis) or lung (pulmonary embolus). What happens before the procedure?  Ask your health care provider about changing or stopping your regular medicines.  Do not take aspirin or blood thinners (anticoagulants) for 1 week before the surgery or as directed by your health care provider.  Do not eat or drink anything for 8 hours before the surgery or as directed by your health care provider.  Empty your bladder before the procedure begins. What happens during the procedure?  An IV tube will be placed into one of your veins. This  will be used to give you a sedative and pain medication (conscious sedation).  You will be given a medicine that numbs the area (local anesthetic).  A small cut will be made in your groin. A catheter is then inserted into the main artery of your leg.  The catheter will be guided through the artery to your uterus. A series of images will be taken while dye is injected through the catheter in your groin. X-rays are taken at the same time. This is done to provide a road map of the blood supply to your uterus and fibroids.  Tiny plastic spheres, about the size of sand grains, will be injected through the catheter. Metal coils may be used to help block the artery. The particles will lodge in tiny branches of the uterine artery that supplies blood to the fibroids.  The procedure is repeated on the artery that supplies the other side of the uterus.  The catheter is then removed and pressure is held to stop any bleeding. No stitches are needed.  A dressing is then placed over the cut (incision). What happens after the procedure?  You will be taken to a recovery area  where your progress will be monitored until you are awake, stable, and taking fluids well. If there are no other problems, you will then be moved to a regular hospital room.  You will be observed overnight in the hospital.  You will have cramping that should be controlled with pain medication. This information is not intended to replace advice given to you by your health care provider. Make sure you discuss any questions you have with your health care provider. Document Released: 04/19/2005 Document Revised: 07/10/2015 Document Reviewed: 08/17/2012 Elsevier Interactive Patient Education  2017 [employer].

## 2016-05-04 ENCOUNTER — Telehealth: Payer: Self-pay

## 2016-05-04 NOTE — Telephone Encounter (Signed)
Susan Bridges with [facility] in [place] left v/m; pt is in a diabetic program at [facility] and BS are not at goal; [name] wanted to ck if Dr Damita Dunnings would consider Metformin extended osmatic release 2000 mg which pts usually can tolerate since slowly released thru body. Is covered item on pts ins list. Susan Bridges request cb.

## 2016-05-05 MED ORDER — METFORMIN HCL ER (OSM) 1000 MG PO TB24: 2000.0000 mg | ORAL_TABLET | Freq: Every day | ORAL | 3 refills | Status: DC

## 2016-05-05 NOTE — Telephone Encounter (Signed)
Left message for Susan Bridges

## 2016-05-05 NOTE — Telephone Encounter (Signed)
Reasonable to try. Sent.  Thanks.  Notify pt.  Update me if not tolerated.

## 2016-05-31 ENCOUNTER — Ambulatory Visit: Payer: Managed Care, Other (non HMO) | Admitting: Obstetrics & Gynecology

## 2016-06-10 ENCOUNTER — Other Ambulatory Visit: Payer: Self-pay | Admitting: Family Medicine

## 2016-06-26 ENCOUNTER — Emergency Department: Payer: Managed Care, Other (non HMO)

## 2016-06-26 ENCOUNTER — Emergency Department
Admission: EM | Admit: 2016-06-26 | Discharge: 2016-06-26 | Disposition: A | Discharge status: Home / Self Care | Payer: Managed Care, Other (non HMO) | Attending: Emergency Medicine | Admitting: Emergency Medicine

## 2016-06-26 ENCOUNTER — Encounter: Payer: Self-pay | Admitting: Emergency Medicine

## 2016-06-26 DIAGNOSIS — R102 Pelvic and perineal pain: Secondary | ICD-10-CM | POA: Diagnosis not present

## 2016-06-26 DIAGNOSIS — Z79899 Other long term (current) drug therapy: Secondary | ICD-10-CM | POA: Insufficient documentation

## 2016-06-26 DIAGNOSIS — N760 Acute vaginitis: Secondary | ICD-10-CM | POA: Diagnosis not present

## 2016-06-26 DIAGNOSIS — E119 Type 2 diabetes mellitus without complications: Secondary | ICD-10-CM | POA: Diagnosis not present

## 2016-06-26 DIAGNOSIS — R1031 Right lower quadrant pain: Secondary | ICD-10-CM | POA: Diagnosis present

## 2016-06-26 DIAGNOSIS — B9689 Other specified bacterial agents as the cause of diseases classified elsewhere: Secondary | ICD-10-CM

## 2016-06-26 DIAGNOSIS — D259 Leiomyoma of uterus, unspecified: Secondary | ICD-10-CM | POA: Diagnosis not present

## 2016-06-26 DIAGNOSIS — I10 Essential (primary) hypertension: Secondary | ICD-10-CM | POA: Diagnosis not present

## 2016-06-26 LAB — CBC
HCT: 40.1 % (ref 35.0–47.0)
Hemoglobin: 13.1 g/dL (ref 12.0–16.0)
MCH: 28.4 pg (ref 26.0–34.0)
MCHC: 32.8 g/dL (ref 32.0–36.0)
MCV: 86.7 fL (ref 80.0–100.0)
Platelets: 408 10*3/uL (ref 150–440)
RBC: 4.62 MIL/uL (ref 3.80–5.20)
RDW: 13.9 % (ref 11.5–14.5)
WBC: 10.5 10*3/uL (ref 3.6–11.0)

## 2016-06-26 LAB — WET PREP, GENITAL
SPERM: NONE SEEN
Trich, Wet Prep: NONE SEEN
YEAST WET PREP: NONE SEEN

## 2016-06-26 LAB — COMPREHENSIVE METABOLIC PANEL
ALBUMIN: 4.2 g/dL (ref 3.5–5.0)
ALK PHOS: 41 U/L (ref 38–126)
ALT: 12 U/L — ABNORMAL LOW (ref 14–54)
AST: 16 U/L (ref 15–41)
Anion gap: 10 (ref 5–15)
BILIRUBIN TOTAL: 0.8 mg/dL (ref 0.3–1.2)
BUN: 11 mg/dL (ref 6–20)
CALCIUM: 9.5 mg/dL (ref 8.9–10.3)
CO2: 23 mmol/L (ref 22–32)
Chloride: 104 mmol/L (ref 101–111)
Creatinine, Ser: 1.04 mg/dL — ABNORMAL HIGH (ref 0.44–1.00)
GFR calc Af Amer: >60 mL/min (ref >60)
GLUCOSE: 130 mg/dL — AB (ref 65–99)
Potassium: 4.1 mmol/L (ref 3.5–5.1)
Sodium: 137 mmol/L (ref 135–145)
TOTAL PROTEIN: 8.5 g/dL — AB (ref 6.5–8.1)

## 2016-06-26 LAB — URINALYSIS, COMPLETE (UACMP) WITH MICROSCOPIC
Bilirubin Urine: NEGATIVE
GLUCOSE, UA: NEGATIVE mg/dL
HGB URINE DIPSTICK: NEGATIVE
Ketones, ur: NEGATIVE mg/dL
NITRITE: NEGATIVE
Protein, ur: 30 mg/dL — AB
SPECIFIC GRAVITY, URINE: 1.02 (ref 1.005–1.030)
pH: 6 (ref 5.0–8.0)

## 2016-06-26 LAB — CHLAMYDIA/NGC RT PCR (ARMC ONLY)
Chlamydia Tr: NOT DETECTED
N gonorrhoeae: NOT DETECTED

## 2016-06-26 LAB — HCG, QUANTITATIVE, PREGNANCY

## 2016-06-26 LAB — LIPASE, BLOOD: Lipase: 32 U/L (ref 11–51)

## 2016-06-26 MED ORDER — IOPAMIDOL (ISOVUE-300) INJECTION 61%
30.0000 mL | Freq: Once | INTRAVENOUS | Status: AC | PRN
  Administered 2016-06-26: 30 mL via ORAL

## 2016-06-26 MED ORDER — METRONIDAZOLE 500 MG PO TABS: 500.0000 mg | ORAL_TABLET | Freq: Two times a day (BID) | ORAL | 0 refills | Status: DC

## 2016-06-26 MED ORDER — IOPAMIDOL (ISOVUE-300) INJECTION 61%
100.0000 mL | Freq: Once | INTRAVENOUS | Status: AC | PRN
  Administered 2016-06-26: 100 mL via INTRAVENOUS

## 2016-06-26 MED ORDER — KETOROLAC TROMETHAMINE 30 MG/ML IJ SOLN
30.0000 mg | Freq: Once | INTRAMUSCULAR | Status: AC
  Administered 2016-06-26: 30 mg via INTRAVENOUS
  Filled 2016-06-26: qty 1

## 2016-06-26 MED ORDER — ACETAMINOPHEN 500 MG PO TABS
1000.0000 mg | ORAL_TABLET | ORAL | Status: AC
  Administered 2016-06-26: 1000 mg via ORAL
  Filled 2016-06-26: qty 2

## 2016-06-26 NOTE — Discharge Instructions (Signed)
Follow-up closely with your gynecologist, or setup a follow-up with [facility] as soon as possible.   Please return to the emergency room right away if you are to develop a fever, severe nausea, your pain becomes severe or worsens, you are unable to keep food down, begin vomiting any dark or bloody fluid, you develop any dark or bloody stools, feel dehydrated, or other new concerns or symptoms arise.

## 2016-06-26 NOTE — ED Notes (Signed)
Pelvic cart at bedside. 

## 2016-06-26 NOTE — ED Notes (Signed)
MD at bedside. 

## 2016-06-26 NOTE — ED Provider Notes (Signed)
[facility] Emergency Department Provider Note   ____________________________________________   First MD Initiated Contact with Patient 06/26/16 1052     (approximate)  I have reviewed the triage vital signs and the nursing notes.   HISTORY  Chief Complaint Abdominal Pain  HPI Susan Bridges is a 36 y.o. female reports she began experiencing a sharp pain in the right lower abdomen over the last 24 hours. This been fairly constant consistent, but worsening. Took ibuprofen yesterday with minimal relief.  Reports a sharp moderate discomfort that hard to get herself comfortable, has to keep brought herself back and forth due to pain.  She does report extensive history of fibroid uterus, has been on birth control but was switched to a new progesterone only birth control about a month and a half ago. She has not had a menstrual cycle since January  No fevers chills nausea or vomiting. No chest pain or trouble breathing. No numbness weakness or pain in the back. Denies any pain or burning with urination. No vaginal discharge.  Reports she was last sexually active about one year ago, has not expressed any vaginal discharge.   Past Medical History:  Diagnosis Date  . Anemia    from heavy menses  . Diabetes mellitus   . Fibroadenoma of breast   . Hyperlipidemia   . Hypertension   . Obesity   . OSA (obstructive sleep apnea)    s/p UPPP  . Uterine fibroid     Patient Active Problem List   Diagnosis Date Noted  . Urinary urgency 04/06/2016  . LUQ pain 04/01/2015  . Trigger finger, acquired 02/21/2014  . Normal pap smear cytology with positive HRHPV 18/45 12/18/2013  . Routine general medical examination at a health care facility 03/19/2013  . Fibroids 12/06/2012  . Nail abnormalities 01/31/2012  . Hemorrhoids 10/25/2011  . Hyperaldosteronism ([place]) 07/21/2011  . OSA (obstructive sleep apnea) 09/24/2010  . Diabetes mellitus with renal manifestation  ([place]) 09/24/2010  . Essential hypertension, benign 09/24/2010  . HLD (hyperlipidemia) 09/24/2010  . Overweight(278.02) 09/24/2010  . S/P colonoscopy 09/24/2010  . Heavy menses 09/24/2010    Past Surgical History:  Procedure Laterality Date  . ADRENALECTOMY  2013   Right, done at [facility]  . BREAST BIOPSY    . BREAST EXCISIONAL BIOPSY Left 06/15/2008   benign  . TONSILLECTOMY AND ADENOIDECTOMY    . UVULOPALATOPHARYNGOPLASTY      Prior to Admission medications   Medication Sig Start Date End Date Taking? Authorizing Provider  atorvastatin (LIPITOR) 20 MG tablet TAKE 1 TABLET DAILY 06/10/16   Tonia Ghent, MD  diltiazem [facility]) 420 MG 24 hr capsule Take 1 capsule (420 mg total) by mouth daily. 12/02/15   Tonia Ghent, MD  ferrous sulfate 325 (65 FE) MG EC tablet Take 1 tablet (325 mg total) by mouth daily with breakfast. 03/31/15   Tonia Ghent, MD  glipiZIDE (GLUCOTROL) 10 MG tablet 1 tab in the AM, 1/2 tab in the PM. 12/23/15   Tonia Ghent, MD  glucose blood (ONETOUCH VERIO) test strip Use as instructed to check blood sugar once daily and as needed.  Diagnosis: E11.9  Non insulin dependent 12/02/15   Tonia Ghent, MD  labetalol (NORMODYNE) 100 MG tablet TAKE 1 TABLET TWICE A DAY 06/10/16   Tonia Ghent, MD  lisinopril (PRINIVIL,ZESTRIL) 10 MG tablet Take 1 tablet (10 mg total) by mouth daily. 03/11/16   Tonia Ghent, MD  megestrol (MEGACE) 40  MG tablet Take 1 tablet (40 mg total) by mouth 2 (two) times daily. Can increase to two tablets twice a day in the event of heavy bleeding 04/23/16   Anyanwu, Sallyanne Havers, MD  metformin (FORTAMET) 1000 MG (OSM) 24 hr tablet Take 2 tablets (2,000 mg total) by mouth daily with breakfast. 05/05/16   Tonia Ghent, MD  metroNIDAZOLE (FLAGYL) 500 MG tablet Take 1 tablet (500 mg total) by mouth 2 (two) times daily. 06/26/16   Delman Kitten, MD  [facility] DELICA LANCETS 50Y MISC Use as instructed to test blood sugar once daily and as needed.   Diagnosis:  E11.9  Non insulin dependent. 12/02/15   Tonia Ghent, MD  triamterene-hydrochlorothiazide (DYAZIDE) 37.5-25 MG capsule TAKE 1 CAPSULE EVERY       MORNING 09/23/15   Tonia Ghent, MD  triamterene-hydrochlorothiazide [facility]) 37.5-25 MG capsule TAKE 1 CAPSULE EVERY       MORNING 06/10/16   Tonia Ghent, MD    Allergies Amlodipine and Calcium channel blockers  Family History  Problem Relation Age of Onset  . Heart disease Mother   . Hypertension Mother   . Diabetes Mother   . Hypertension Father   . Hyperlipidemia Father   . Pulmonary embolism Father   . Diabetes Maternal Grandmother   . Stroke Maternal Grandmother   . Heart disease Paternal Grandfather   . Hypertension Paternal Grandfather   . Breast cancer Other   . Breast cancer Paternal Grandmother   . Colon cancer Neg Hx     Social History Social History  Substance Use Topics  . Smoking status: Never Smoker  . Smokeless tobacco: Never Used  . Alcohol use 0.0 oz/week     Comment: socially    Review of Systems Constitutional: No fever/chills Eyes: No visual changes. ENT: No sore throat. Cardiovascular: Denies chest pain. Respiratory: Denies shortness of breath. Gastrointestinal: No abdominal pain.  No nausea, no vomiting.  No diarrhea.  No constipation. Genitourinary: Negative for dysuria. Musculoskeletal: Negative for back pain. Skin: Negative for rash. Neurological: Negative for headaches, focal weakness or numbness.  10-point ROS otherwise negative.  ____________________________________________   PHYSICAL EXAM:  VITAL SIGNS: ED Triage Vitals  Enc Vitals Group     BP 06/26/16 1017 (!) 137/108     Pulse Rate 06/26/16 1017 (!) 114     Resp 06/26/16 1017 18     Temp 06/26/16 1017 98.1 F (36.7 C)     Temp Source 06/26/16 1017 Oral     SpO2 06/26/16 1017 100 %     Weight 06/26/16 1018 215 lb (97.5 kg)     Height 06/26/16 1018 5\' 4"  (1.626 m)     Head Circumference --      Peak  Flow --      Pain Score 06/26/16 1017 7     Pain Loc --      Pain Edu? --      Excl. in [place]? --     Constitutional: Alert and oriented. Well appearing and in no acute distress.She and her father a are very pleasant Eyes: Conjunctivae are normal. PERRL. EOMI. Head: Atraumatic. Nose: No congestion/rhinnorhea. Mouth/Throat: Mucous membranes are moist.  Oropharynx non-erythematous. Neck: No stridor.   Cardiovascular: Normal rate, regular rhythm. Grossly normal heart sounds.  Good peripheral circulation. Respiratory: Normal respiratory effort.  No retractions. Lungs CTAB. Gastrointestinal: Soft and nontender except for some minimal discomfort to palpation in the right lower quadrant, she has a very firm uterine fundus,  probable palpable uterus just above below the level of the umbilicus. No distention. No abdominal bruits. No CVA tenderness. Normal external exam. Escorted by Larene [place]. Internal exam demonstrates minimal discomfort in the right adnexa. No discharge. No cervical motion tenderness Musculoskeletal: No lower extremity tenderness nor edema.  No joint effusions. Neurologic:  Normal speech and language. No gross focal neurologic deficits are appreciated.  Skin:  Skin is warm, dry and intact. No rash noted. Psychiatric: Mood and affect are normal. Speech and behavior are normal.  ____________________________________________   LABS (all labs ordered are listed, but only abnormal results are displayed)  Labs Reviewed  WET PREP, GENITAL - Abnormal; Notable for the following:       Result Value   Clue Cells Wet Prep HPF POC PRESENT (*)    WBC, Wet Prep HPF POC MANY (*)    All other components within normal limits  COMPREHENSIVE METABOLIC PANEL - Abnormal; Notable for the following:    Glucose, Bld 130 (*)    Creatinine, Ser 1.04 (*)    Total Protein 8.5 (*)    ALT 12 (*)    All other components within normal limits  URINALYSIS, COMPLETE (UACMP) WITH MICROSCOPIC - Abnormal; Notable  for the following:    Color, Urine YELLOW (*)    APPearance HAZY (*)    Protein, ur 30 (*)    Leukocytes, UA SMALL (*)    Bacteria, UA RARE (*)    Squamous Epithelial / LPF 0-5 (*)    All other components within normal limits  CHLAMYDIA/NGC RT PCR ([facility] ONLY)  URINE CULTURE  LIPASE, BLOOD  CBC  HCG, QUANTITATIVE, PREGNANCY   ____________________________________________  EKG   ____________________________________________  RADIOLOGY  US Pelvis Complete  Result Date: 06/26/2016 CLINICAL DATA:  Right-sided pelvic pain and known history of uterine enlargement and multiple uterine fibroids. EXAM: TRANSABDOMINAL ULTRASOUND OF PELVIS DOPPLER ULTRASOUND OF OVARIES TECHNIQUE: Transabdominal ultrasound examination of the pelvis was performed including evaluation of the uterus, ovaries, adnexal regions, and pelvic cul-de-sac. Color and duplex Doppler ultrasound was utilized to evaluate blood flow to the ovaries. COMPARISON:  04/12/2016 FINDINGS: Uterus Measurements: 20 x 13.4 x 15.6 cm. Multiple ill-defined fibroids are again seen throughout the uterus. The largest is on the order of 13 cm in the posterior uterus and previously measured approximately 10 cm. Exact margination of fibroids is difficult to determine, however, by transabdominal sonography due to ill-defined borders. Endometrium The endometrium is distorted by fibroids with a small amount of fluid identified in a portion of the endometrial cavity. Endometrial thickness estimated at 9 mm. Right ovary Measurements: 2.6 x 1.6 x 2.0 70. Normal appearance/no adnexal mass. Left ovary Measurements: 2.5 x 1.8 x 2.0 cm. Normal appearance/no adnexal mass. Pulsed Doppler evaluation demonstrates normal low-resistance arterial and venous waveforms in both ovaries. No free fluid identified in the pelvis. IMPRESSION: Overall uterine size appears increased compared to the prior pelvic ultrasound in February due to increasing size of uterine  fibroids. Dominant posterior fibroid now measures approximately 13 cm in greatest estimated diameter. The ovaries are normal bilaterally. Electronically Signed   By: Aletta Edouard M.D.   On: 06/26/2016 13:59   Ct Abdomen Pelvis W Contrast  Result Date: 06/26/2016 CLINICAL DATA:  36 year old female with right lower quadrant and pelvic pain for the past 2 weeks EXAM: CT ABDOMEN AND PELVIS WITH CONTRAST TECHNIQUE: Multidetector CT imaging of the abdomen and pelvis was performed using the standard protocol following bolus administration of intravenous contrast. CONTRAST:  175mL  ISOVUE-300 IOPAMIDOL (ISOVUE-300) INJECTION 61% COMPARISON:  Pelvic ultrasound obtained earlier today ; prior CT scan of the abdomen and pelvis 03/18/2015 FINDINGS: Lower chest: The lung bases are clear. Visualized cardiac structures are within normal limits for size. No pericardial effusion. Unremarkable visualized distal thoracic esophagus. Hepatobiliary: Geographic hypoattenuation in the left hemi-liver adjacent to the fissure for the falciform ligament is nonspecific but most suggestive of benign focal fatty infiltration. Otherwise, normal hepatic contour and morphology without discrete lesion. Gallbladder is unremarkable. No intra or extrahepatic biliary ductal dilatation. Pancreas: Unremarkable. No pancreatic ductal dilatation or surrounding inflammatory changes. Spleen: Normal in size without focal abnormality. Adrenals/Urinary Tract: The right adrenal gland is surgically absent. The left adrenal gland is normal. Symmetric renal parenchymal enhancement bilaterally. No hydronephrosis or nephrolithiasis. The bladder is decompressed and not well seen. Stomach/Bowel: No evidence of obstruction or focal bowel wall thickening. Normal appendix in the right lower quadrant. The terminal ileum is unremarkable. Vascular/Lymphatic: No significant vascular findings are present. No enlarged abdominal or pelvic lymph nodes. Reproductive: Marked  interval enlargement of the globular and multinodular fibroid uterus compared to 03/18/2015. The uterus now measures approximately 19.0 x 18.9 x 13.3 cm compared to 15.2 x 12.1 x 13.8 cm. Several of the fibroids demonstrate a change in enhancement with increased central low-attenuation which may represent interval infarction. Other: No abdominal wall hernia or abnormality. No abdominopelvic ascites. Musculoskeletal: No acute or significant osseous findings. IMPRESSION: 1. Marked interval enlargement of multi fibroid uterus compared to 03/18/2015. Additionally, several of the fibroids now demonstrate central low-attenuation suggesting interval infarction and necrosis. This is a likely candidate for the source of the patient's lower abdominal and pelvic pain. Of note, relatively rapid enlargement of the fibroid uterus raises concern for the rare possibility of uterine sarcoma. Enlarging benign uterine leiomyoma remain favored. Recommend non emergent gynecological consultation. 2. Otherwise, no acute abnormality in the abdomen or pelvis. Electronically Signed   By: Jacqulynn Cadet M.D.   On: 06/26/2016 15:04   [place] Art/ven Flow Abd Pelv Doppler  Result Date: 06/26/2016 CLINICAL DATA:  Right-sided pelvic pain and known history of uterine enlargement and multiple uterine fibroids. EXAM: TRANSABDOMINAL ULTRASOUND OF PELVIS DOPPLER ULTRASOUND OF OVARIES TECHNIQUE: Transabdominal ultrasound examination of the pelvis was performed including evaluation of the uterus, ovaries, adnexal regions, and pelvic cul-de-sac. Color and duplex Doppler ultrasound was utilized to evaluate blood flow to the ovaries. COMPARISON:  04/12/2016 FINDINGS: Uterus Measurements: 20 x 13.4 x 15.6 cm. Multiple ill-defined fibroids are again seen throughout the uterus. The largest is on the order of 13 cm in the posterior uterus and previously measured approximately 10 cm. Exact margination of fibroids is difficult to determine, however, by  transabdominal sonography due to ill-defined borders. Endometrium The endometrium is distorted by fibroids with a small amount of fluid identified in a portion of the endometrial cavity. Endometrial thickness estimated at 9 mm. Right ovary Measurements: 2.6 x 1.6 x 2.0 70. Normal appearance/no adnexal mass. Left ovary Measurements: 2.5 x 1.8 x 2.0 cm. Normal appearance/no adnexal mass. Pulsed Doppler evaluation demonstrates normal low-resistance arterial and venous waveforms in both ovaries. No free fluid identified in the pelvis. IMPRESSION: Overall uterine size appears increased compared to the prior pelvic ultrasound in February due to increasing size of uterine fibroids. Dominant posterior fibroid now measures approximately 13 cm in greatest estimated diameter. The ovaries are normal bilaterally. Electronically Signed   By: Aletta Edouard M.D.   On: 06/26/2016 13:59    ____________________________________________   PROCEDURES  Procedure(s) performed: None  Procedures  Critical Care performed: No  ____________________________________________   INITIAL IMPRESSION / ASSESSMENT AND PLAN / ED COURSE  Pertinent labs & imaging results that were available during my care of the patient were reviewed by me and considered in my medical decision making (see chart for details).  Patient was reevaluation sharp right lower quadrant pain. Has extensive history of uterine fibroids and appears to have a palpable uterus on exam. Denies any pregnancy. Given her history of a sharp pain in the right lower quadrant given her history I'm concerned about the possibility of ovarian torsion, less concerned about the possibility for appendicitis or other obvious intra-abdominal cause. Plan to first evaluate with pelvic exam, transvaginal ultrasound to further evaluate for ovarian torsion and/or potential gynecologic/ovarian fibroid or mass/cyst which could be responsible. Await pregnancy test.    Clinical Course  as of Jun 26 1529  Sat Jun 26, 2016  1328 Patient reports she is feeling more comfortable, does not appear in any pain now. Does not wish for any additional pain medicine. As far her evaluation has not demonstrated a clear cause for right lower quadrant pain, discussed with the patient and we will further evaluate via CT scan to evaluate for other causes of intra-abdominal pathology  [MQ]    Clinical Course User Index [MQ] Delman Kitten, MD   ----------------------------------------- 3:32 PM on 06/26/2016 -----------------------------------------  Review of results with patient, will treat for possible BV. Suspect pain likely due to necrosis or involuting fibroids. Discussed with her and she has an OB who follows her closely for this in the past, she was set up close follow-up with them and also has been told she should be referred by her doctor to the [facility] fibroid clinic. She will seek her gynecologist for follow-up, and understands need for further testing. I also discussed and she is amenable to setting up follow-up with [facility] for additional opinion.  Return precautions and treatment recommendations and follow-up discussed with the patient who is agreeable with the plan.   ____________________________________________   FINAL CLINICAL IMPRESSION(S) / ED DIAGNOSES  Final diagnoses:  Uterine leiomyoma, unspecified location  BV (bacterial vaginosis)      NEW MEDICATIONS STARTED DURING THIS VISIT:  New Prescriptions   METRONIDAZOLE (FLAGYL) 500 MG TABLET    Take 1 tablet (500 mg total) by mouth 2 (two) times daily.     Note:  This document was prepared using Dragon voice recognition software and may include unintentional dictation errors.     Delman Kitten, MD 06/26/16 [phone]

## 2016-06-26 NOTE — ED Triage Notes (Signed)
Patient presents to the ED with right lower quadrant/pelvic pain x 2 weeks.  Patient states she first noticed the pain after she went to the bathroom.  Patient states pain is worse when standing or lying down and when changing positions.  Patient reports history of uterine fibroids.  Patient is in no obvious distress at this time.

## 2016-06-26 NOTE — ED Notes (Signed)
RN called ultrasound to determine when pt could be taken for ultrasound. Ultrasound reports they are coming to pick pt up.

## 2016-06-26 NOTE — ED Notes (Signed)
PT ambulated to restroom without difficulty in attempts to provide a urine sample. PT ambulating independently and without distress.

## 2016-06-26 NOTE — ED Notes (Signed)
Pt back in room after CT. Pt in NAD, family at bedside.

## 2016-06-27 LAB — URINE CULTURE: Special Requests: NORMAL

## 2016-07-13 ENCOUNTER — Encounter: Payer: Self-pay | Admitting: Obstetrics and Gynecology

## 2016-07-13 ENCOUNTER — Ambulatory Visit (INDEPENDENT_AMBULATORY_CARE_PROVIDER_SITE_OTHER): Payer: Managed Care, Other (non HMO) | Admitting: Obstetrics and Gynecology

## 2016-07-13 VITALS — BP 118/78 | Ht 64.0 in | Wt 206.0 lb

## 2016-07-13 DIAGNOSIS — R1030 Lower abdominal pain, unspecified: Secondary | ICD-10-CM

## 2016-07-13 DIAGNOSIS — N921 Excessive and frequent menstruation with irregular cycle: Secondary | ICD-10-CM

## 2016-07-13 DIAGNOSIS — D259 Leiomyoma of uterus, unspecified: Secondary | ICD-10-CM | POA: Diagnosis not present

## 2016-07-13 NOTE — Progress Notes (Signed)
Obstetrics & Gynecology Office Visit   Chief Complaint  Patient presents with  . Advice Only    fibroids   . Pelvic Pain    History of Present Illness: 36 y.o. G0P0000 female here to get second opinion on management of fibroids.  The patient is managed by Dr. Verita Schneiders at the [facility] in [place].  At her last visit in March she was started on Megace to help with bleeding (her estrogen-containing OCPs were discontinued due to HTN).  She had a CT scan on 06/26/16 that showed (per report): marked enlargement of the globular and multinodular fibroid uterus (when compared to a prior study on 02/2015). The uterus measures 19 x 18.9 x 13.3 cm (15.2 x 12.1 x 13.8cm in 02/2015). Several of the fibroids demonstrate a change in enhancement which may represent interval infarction.  Noted in the report was the possibility of uterine sarcoma.  On the same day in the ER a pelvic ultrasound was performed per the report: endometrium distorted by fibroids, small amount of fluid in a portion of the endometrial cavity. Estimated endometrial thickness of 59mm.  Normal ovaries. Uterus measures 20 x 13.4 x 15.6 cm. Multiple ill-defined fibroids are seen throughout the uterus. The largest is about 13 cm in the posterior uterus (previous measurement was 10 cm on 04/12/16). The exact margination of the fibroids is difficult to determine.   She notes an improvement in bleeding symptoms since starting Megace with Dr. Harolyn Rutherford.   She wants a second opinion regarding management of the fibroids, as she is unsure of whether she desires fertility.  She has had significant abdominal pain and has presented to the ER on several occasions, as recently as 06/26/16 for RLQ abdominal pain for which no obvious etiology could be found. It was supposed, based on my review of the notes, that the pain might be from necrosis of her fibroids.   Review of Systems: Review of Systems  Constitutional: Negative.   HENT:  Negative.   Eyes: Negative.   Respiratory: Negative.   Cardiovascular: Negative.   Gastrointestinal: Positive for abdominal pain. Negative for blood in stool, constipation, diarrhea, melena, nausea and vomiting.  Genitourinary: Negative.   Musculoskeletal: Negative.   Skin: Negative.   Neurological: Negative.   Psychiatric/Behavioral: Negative.     Past Medical History:  Diagnosis Date  . Anemia    from heavy menses  . Diabetes mellitus   . Fibroadenoma of breast   . Hyperlipidemia   . Hypertension   . Obesity   . OSA (obstructive sleep apnea)    s/p UPPP  . Uterine fibroid     Past Surgical History:  Past Surgical History:  Procedure Laterality Date  . ADRENALECTOMY  2013   Right, done at [facility]  . BREAST BIOPSY    . BREAST EXCISIONAL BIOPSY Left 06/15/2008   benign  . TONSILLECTOMY AND ADENOIDECTOMY    . UVULOPALATOPHARYNGOPLASTY      Gynecologic History: Patient's last menstrual period was 03/14/2016.  Obstetric History: G0P0000  Family History  Problem Relation Age of Onset  . Heart disease Mother   . Hypertension Mother   . Diabetes Mother   . Hypertension Father   . Hyperlipidemia Father   . Pulmonary embolism Father   . Diabetes Maternal Grandmother   . Stroke Maternal Grandmother   . Heart disease Paternal Grandfather   . Hypertension Paternal Grandfather   . Breast cancer Other   . Breast cancer Paternal Grandmother   . Colon  cancer Neg Hx     Social History   Social History  . Marital status: Single    Spouse name: N/A  . Number of children: N/A  . Years of education: N/A   Occupational History  . Not on file.   Social History Main Topics  . Smoking status: Never Smoker  . Smokeless tobacco: Never Used  . Alcohol use 0.0 oz/week     Comment: socially  . Drug use: No  . Sexual activity: Not Currently    Partners: Male    Birth control/ protection: None   Other Topics Concern  . 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 Amlodipine     Avoid CCBs due to gum disease.   . Calcium Channel Blockers     Avoid CCBs other than diltiazem due to gum disease.     Medications:   Medication Sig Start Date End Date Taking? Authorizing Provider  atorvastatin (LIPITOR) 20 MG tablet TAKE 1 TABLET DAILY 06/10/16  Yes Tonia Ghent, MD  diltiazem [facility]) 420 MG 24 hr capsule Take 1 capsule (420 mg total) by mouth daily. 12/02/15  Yes Tonia Ghent, MD  glipiZIDE (GLUCOTROL) 10 MG tablet 1 tab in the AM, 1/2 tab in the PM. 12/23/15  Yes Tonia Ghent, MD  glucose blood (ONETOUCH VERIO) test strip Use as instructed to check blood sugar once daily and as needed.  Diagnosis: E11.9  Non insulin dependent 12/02/15  Yes Tonia Ghent, MD  labetalol (NORMODYNE) 100 MG tablet TAKE 1 TABLET TWICE A DAY 06/10/16  Yes Tonia Ghent, MD  lisinopril (PRINIVIL,ZESTRIL) 10 MG tablet Take 1 tablet (10 mg total) by mouth daily. 03/11/16  Yes Tonia Ghent, MD  megestrol (MEGACE) 40 MG tablet Take 1 tablet (40 mg total) by mouth 2 (two) times daily. Can increase to two tablets twice a day in the event of heavy bleeding 04/23/16  Yes Anyanwu, Sallyanne Havers, MD  metformin (FORTAMET) 1000 MG (OSM) 24 hr tablet Take 2 tablets (2,000 mg total) by mouth daily with breakfast. 05/05/16  Yes Tonia Ghent, MD  [facility] DELICA LANCETS 53G MISC Use as instructed to test blood sugar once daily and as needed.  Diagnosis:  E11.9  Non insulin dependent. 12/02/15  Yes Tonia Ghent, MD  triamterene-hydrochlorothiazide (DYAZIDE) 37.5-25 MG capsule TAKE 1 CAPSULE EVERY       MORNING 09/23/15  Yes Tonia Ghent, MD  ferrous sulfate 325 (65 FE) MG EC tablet Take 1 tablet (325 mg total) by mouth daily with breakfast. Patient not taking: Reported on 07/13/2016 03/31/15   Tonia Ghent, MD  metroNIDAZOLE (FLAGYL) 500 MG tablet Take 1 tablet (500 mg total) by  mouth 2 (two) times daily. Patient not taking: Reported on 07/13/2016 06/26/16   Delman Kitten, MD  triamterene-hydrochlorothiazide (DYAZIDE) 37.5-25 MG capsule TAKE 1 CAPSULE EVERY       MORNING Patient not taking: Reported on 07/13/2016 06/10/16   Tonia Ghent, MD    Physical Exam BP 118/78   Ht 5\' 4"  (1.626 m)   Wt 206 lb (93.4 kg)   LMP 03/14/2016   BMI 35.36 kg/m  Patient's last menstrual period was 03/14/2016. Physical Exam  Constitutional: She is oriented to person, place, and time and well-developed, well-nourished, and in no distress. No distress.  HENT:  Head: Normocephalic and atraumatic.  Eyes: Conjunctivae are normal. No scleral icterus.  Neck: Normal range of motion. No thyromegaly present.  Cardiovascular: Normal rate and regular rhythm.  Exam reveals no gallop and no friction rub.   No murmur heard. Pulmonary/Chest: Effort normal and breath sounds normal. No respiratory distress. She has no wheezes. She has no rales.  Abdominal: Soft. Bowel sounds are normal. She exhibits mass (to just below the level of the umbilicus, a mobile mass is noted.). She exhibits no distension. There is no tenderness. There is no rebound and no guarding.  Genitourinary:  Genitourinary Comments: Deferred per patient request  Musculoskeletal: Normal range of motion. She exhibits no edema.  Lymphadenopathy:    She has no cervical adenopathy.  Neurological: She is alert and oriented to person, place, and time. No cranial nerve deficit.  Skin: Skin is warm and dry. No rash noted. No erythema.  Psychiatric: Mood, affect and judgment normal.     Assessment: 36 y.o. G0P0000 with a fibroid uterus with abdominal pain and abnormal bleeding.    Plan: I have reviewed the patient's chart, note, imaging, lab work, and medical history from previous visits.  I discussed with the patient that the next step for her is to decide whether she would like to retain fertility.  She states she was asked this by  her gynecologist. However, she has been unable to make a decision on this point.  If she wishes to retain fertility, she might consider a surgery to remove her fibroids and attempt to leave her uterus intact, to the extent possible.  Without an MRI a true sense of what surgery might entail is hard to give her.  Even then she might have to accept that she might require IVF to accomplish pregnancy and her pregnancy would be at risk for need for cesarean delivery at an earlier gestational age. She might also be at risk for placenta accreta, and she might have difficulty carrying a pregnancy at all.  Her other multiple medical co-morbidities make her a very high risk candidate for pregnancy.   If she does not wish to retain her fertility, my primary recommendation would be hysterectomy. Other options were reviewed with her, including; Uterine Artery Embolization.  Should she not want surgery, she should consider a consultation with a practitioner who performs these procedures to give her an idea of whether she is a good candidate for this procedure and what outcome she could expect.  Even if her fibroids were removed, this would be a large surgery, requiring time to recover and allow her uterus to heal. She is at high risk of re-forming fibroids in the future.  There may be very small fibroids that could not be noted on surgical evaluation and that might not be removed and continue to grow until she might require surgery again.   Finally, I discussed with her the fact that while the rate of size increase of her fibroids does not necessarily indicate that she has a malignancy, I can not tell her for sure that her fibroids are benign.    She would like to consider all of the above. If she requires any further discussion or management, I am happy to see her again.   30 minutes spent in face to face discussion with > 50% spent in counseling and management of her fibroid uterus and associated symptoms.    Prentice Docker, MD 07/15/2016 4:01 PM

## 2016-07-15 DIAGNOSIS — R109 Unspecified abdominal pain: Secondary | ICD-10-CM | POA: Insufficient documentation

## 2016-07-16 ENCOUNTER — Telehealth: Payer: Self-pay | Admitting: *Deleted

## 2016-07-16 DIAGNOSIS — D259 Leiomyoma of uterus, unspecified: Secondary | ICD-10-CM

## 2016-07-16 MED ORDER — DICLOFENAC SODIUM 75 MG PO TBEC: 75.0000 mg | DELAYED_RELEASE_TABLET | Freq: Two times a day (BID) | ORAL | 0 refills | Status: DC

## 2016-07-16 NOTE — Telephone Encounter (Signed)
-----   Message from Blanchie Dessert, [place] sent at 07/16/2016  8:19 AM EDT ----- Regarding: needing something for pain for fibroids Contact: [phone] Pt is requesting a call back, she is having some severe pain with fibroids, states that sometimes can be controlled with ibuprofen and over the counter pain relief but others it is unmanageable, pt uses  [facility] in [place]

## 2016-07-16 NOTE — Telephone Encounter (Signed)
Sent Diclofenac to pharmacy and instructed pt on medication use.  Also encouraged to take Tylenol as well.  Pt has follow-up with Dr Harolyn Rutherford on 08-12-16 to discuss further management.

## 2016-07-19 ENCOUNTER — Telehealth: Payer: Self-pay | Admitting: Family Medicine

## 2016-07-19 DIAGNOSIS — R809 Proteinuria, unspecified: Principal | ICD-10-CM

## 2016-07-19 DIAGNOSIS — E1129 Type 2 diabetes mellitus with other diabetic kidney complication: Secondary | ICD-10-CM

## 2016-07-19 NOTE — Telephone Encounter (Signed)
Patient advised.  Lab appt scheduled.  

## 2016-07-19 NOTE — Telephone Encounter (Signed)
Ordered. Thanks

## 2016-07-19 NOTE — Telephone Encounter (Signed)
Patient scheduled an appointment for 07/21/16 for a swollen arm.  Patient said she's due for a 3 month follow up and wants to know if Dr.Duncan will order lab work and she'll come in tomorrow for the lab work.

## 2016-07-20 ENCOUNTER — Other Ambulatory Visit: Payer: Managed Care, Other (non HMO)

## 2016-07-21 ENCOUNTER — Ambulatory Visit (INDEPENDENT_AMBULATORY_CARE_PROVIDER_SITE_OTHER): Payer: Managed Care, Other (non HMO) | Admitting: Family Medicine

## 2016-07-21 ENCOUNTER — Encounter: Payer: Self-pay | Admitting: Family Medicine

## 2016-07-21 DIAGNOSIS — M7521 Bicipital tendinitis, right shoulder: Secondary | ICD-10-CM

## 2016-07-21 DIAGNOSIS — D259 Leiomyoma of uterus, unspecified: Secondary | ICD-10-CM | POA: Diagnosis not present

## 2016-07-21 DIAGNOSIS — E1129 Type 2 diabetes mellitus with other diabetic kidney complication: Secondary | ICD-10-CM | POA: Diagnosis not present

## 2016-07-21 DIAGNOSIS — I10 Essential (primary) hypertension: Secondary | ICD-10-CM | POA: Diagnosis not present

## 2016-07-21 DIAGNOSIS — R809 Proteinuria, unspecified: Secondary | ICD-10-CM | POA: Diagnosis not present

## 2016-07-21 NOTE — Progress Notes (Signed)
Diabetes:  Using medications without difficulties: yes Hypoglycemic episodes:not unless prolonged fasting Hyperglycemic episodes:no Feet problems:no Blood Sugars averaging: not checked recently eye exam within last year: yes Weight loss noted. D/w pt.  Labs pending.  D/w pt.   Hypertension:    Using medication without problems or lightheadedness: yes Chest pain with exertion: Edema: Short of breath: Average home BPs: She has f/u with [facility] fibroid clinic pending.  D/w pt.  She is still on ACE, d/w pt about cautions re: ACE and possible pregnancy.   She hasn't taken diclofenac yet.  D/w pt about nsaid cautions.   She is still considering options regarding pregnancy.  R arm swelling.  Near the distal R biceps/insertion.  Better now.  R handed.  No trauma.  PMH and SH reviewed  ROS: Per HPI unless specifically indicated in ROS section   Meds, vitals, and allergies reviewed.   GEN: nad, alert and oriented HEENT: mucous membranes moist NECK: supple w/o LA CV: rrr. PULM: ctab, no inc wob ABD: soft, +bs EXT: no edema SKIN: no acute rash R biceps muscle not tender. When her arm is relaxed insertion of muscles not tender. She does have some discomfort on palpation of the biceps insertion when the muscle is contracted.  No Popeye deformity.  Normal range of motion otherwise. No bruising. No swelling.

## 2016-07-21 NOTE — Patient Instructions (Addendum)
We'll contact you with your lab report. Don't change your meds for now.  If you take diclofenac, then take it with food and with plenty of water.  Use it only if needed.   Ice your right elbow.  Likely tendonitis.  Update me as needed.  Take care.  Glad to see you.

## 2016-07-22 ENCOUNTER — Encounter: Payer: Self-pay | Admitting: Family Medicine

## 2016-07-22 DIAGNOSIS — M7521 Bicipital tendinitis, right shoulder: Secondary | ICD-10-CM | POA: Insufficient documentation

## 2016-07-22 LAB — BASIC METABOLIC PANEL
BUN: 17 mg/dL (ref 6–23)
CALCIUM: 10.6 mg/dL — AB (ref 8.4–10.5)
CO2: 27 mEq/L (ref 19–32)
CREATININE: 1.24 mg/dL — AB (ref 0.40–1.20)
Chloride: 101 mEq/L (ref 96–112)
GFR: 62.35 mL/min (ref >60.00)
GLUCOSE: 181 mg/dL — AB (ref 70–99)
POTASSIUM: 4.6 meq/L (ref 3.5–5.1)
Sodium: 138 mEq/L (ref 135–145)

## 2016-07-22 LAB — HEMOGLOBIN A1C: HEMOGLOBIN A1C: 8 % — AB (ref 4.6–6.5)

## 2016-07-22 NOTE — Assessment & Plan Note (Signed)
Would be okay to use diclofenac as needed. Routine cautions given to patient. See notes on labs regarding creatinine.

## 2016-07-22 NOTE — Assessment & Plan Note (Signed)
Good control. ACE inhibitor cautions and contraindications regarding pregnancy discussed with patient. She understood all of this. She is considering pregnancy. See notes on labs.

## 2016-07-22 NOTE — Assessment & Plan Note (Signed)
Already improved. Anatomy discussed with patient. Relative rest. Ice as needed. Update me as needed. She agrees.

## 2016-07-22 NOTE — Assessment & Plan Note (Addendum)
Continue ACE inhibitor for now. Continue work on diet and exercise. See notes on labs. No change in meds at this point. She agrees. >25 minutes spent in face to face time with patient, >50% spent in counselling or coordination of care.

## 2016-08-12 ENCOUNTER — Ambulatory Visit: Payer: Managed Care, Other (non HMO) | Admitting: Obstetrics & Gynecology

## 2016-08-24 ENCOUNTER — Telehealth: Payer: Self-pay

## 2016-08-24 DIAGNOSIS — E119 Type 2 diabetes mellitus without complications: Secondary | ICD-10-CM

## 2016-08-24 NOTE — Telephone Encounter (Signed)
Patient left VM requesting a referral to see Dr. Gabriel Carina at [place] [facility] she said she saw her back in 2013 and she can't make an appt without a referral. Thanks.

## 2016-08-26 ENCOUNTER — Ambulatory Visit: Payer: Managed Care, Other (non HMO) | Admitting: Family Medicine

## 2016-08-26 NOTE — Telephone Encounter (Signed)
I put in her referral.  I will be in contact with Dr. Lanelle Bal at [facility], who may want her to see a kidney doc.  Her kidney function is stable based on her creatinine.  I don't think she has to f/u with renal, but Dr. Lanelle Bal may encourage that.   Thanks

## 2016-08-26 NOTE — Telephone Encounter (Signed)
Patient advised.

## 2016-08-31 ENCOUNTER — Telehealth: Payer: Self-pay

## 2016-08-31 DIAGNOSIS — E119 Type 2 diabetes mellitus without complications: Secondary | ICD-10-CM

## 2016-08-31 NOTE — Telephone Encounter (Signed)
Pt left v/m; pt appt with Dr Gabriel Carina,. endocrinologist is Oct 22,2018; pt wants to know if there is an endocrinologist that pt might see sooner and if not pt wants to schedule appt with Dr Damita Dunnings. Pt request cb.

## 2016-09-02 NOTE — Telephone Encounter (Signed)
I put in the referral.  Thanks.  

## 2016-09-06 ENCOUNTER — Encounter: Payer: Self-pay | Admitting: Family Medicine

## 2016-09-06 ENCOUNTER — Encounter: Payer: Self-pay | Admitting: *Deleted

## 2016-10-15 ENCOUNTER — Ambulatory Visit (INDEPENDENT_AMBULATORY_CARE_PROVIDER_SITE_OTHER): Payer: Managed Care, Other (non HMO) | Admitting: Endocrinology

## 2016-10-15 ENCOUNTER — Encounter: Payer: Self-pay | Admitting: Endocrinology

## 2016-10-15 VITALS — BP 108/74 | HR 69 | Wt 202.2 lb

## 2016-10-15 DIAGNOSIS — R809 Proteinuria, unspecified: Secondary | ICD-10-CM

## 2016-10-15 DIAGNOSIS — E1129 Type 2 diabetes mellitus with other diabetic kidney complication: Secondary | ICD-10-CM

## 2016-10-15 LAB — POCT GLYCOSYLATED HEMOGLOBIN (HGB A1C): Hemoglobin A1C: 6.5

## 2016-10-15 MED ORDER — GLIPIZIDE 10 MG PO TABS
10.0000 mg | ORAL_TABLET | Freq: Every day | ORAL | 3 refills | Status: DC
Start: 2016-10-15 — End: 2016-11-29

## 2016-10-15 NOTE — Patient Instructions (Addendum)
good diet and exercise significantly improve the control of your diabetes.  please let me know if you wish to be referred to a dietician.  high blood sugar is very risky to your health.  you should see an eye doctor and dentist every year.  It is very important to get all recommended vaccinations.  Controlling your blood pressure and cholesterol drastically reduces the damage diabetes does to your body.  Those who smoke should quit.  Please discuss these with your doctor.  check your blood sugar once a day.  vary the time of day when you check, between before the 3 meals, and at bedtime.  also check if you have symptoms of your blood sugar being too high or too low.  please keep a record of the readings and bring it to your next appointment here (or you can bring the meter itself).  You can write it on any piece of paper.  please call us sooner if your blood sugar goes below 70, or if you have a lot of readings over 200.  Please come back for a follow-up appointment in 2 months, when you will be due for another thyroid blood test.   Please reduce the glipizide to 10 mg each morning, and none in the evening.

## 2016-10-15 NOTE — Progress Notes (Signed)
Subjective:    Patient ID: Susan Bridges, female    DOB: [birth year], 36 y.o.   MRN: [id number]  HPI pt is referred by Dr Damita Dunnings, for diabetes.  Pt states DM was dx'ed in 2001; she has mild if any neuropathy of the lower extremities, but she has associated renal insuff; she has never been on insulin; pt says her diet and exercise are much better now; she has never had GDM (G0) pancreatitis, pancreatic surgery, severe hypoglycemia or DKA.  She takes 2 oral meds. She had right adrenalectomy in 2013, for primary hyperaldosteronism.    Past Medical History:  Diagnosis Date  . Anemia    from heavy menses  . Diabetes mellitus   . Fibroadenoma of breast   . Hyperlipidemia   . Hypertension   . Obesity   . OSA (obstructive sleep apnea)    s/p UPPP  . Uterine fibroid     Past Surgical History:  Procedure Laterality Date  . ADRENALECTOMY  2013   Right, done at [facility]  . BREAST BIOPSY    . BREAST EXCISIONAL BIOPSY Left 06/15/2008   benign  . TONSILLECTOMY AND ADENOIDECTOMY    . UVULOPALATOPHARYNGOPLASTY      Social History   Social History  . Marital status: Single    Spouse name: N/A  . Number of children: N/A  . Years of education: N/A   Occupational History  . Not on file.   Social History Main Topics  . Smoking status: Never Smoker  . Smokeless tobacco: Never Used  . Alcohol use 0.0 oz/week     Comment: socially  . Drug use: No  . Sexual activity: Not Currently    Partners: Male    Birth control/ protection: None   Other Topics Concern  . 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 atorvastatin (LIPITOR) 20 MG tablet TAKE 1 TABLET DAILY 90 tablet 1  . diclofenac (VOLTAREN) 75 MG EC tablet Take 1 tablet (75 mg total) by mouth 2 (two) times daily with a meal. (Patient not taking: Reported on 07/21/2016) 60 tablet 0  .  diltiazem (TIAZAC) 420 MG 24 hr capsule Take 1 capsule (420 mg total) by mouth daily. 90 capsule 3  . glucose blood (ONETOUCH VERIO) test strip Use as instructed to check blood sugar once daily and as needed.  Diagnosis: E11.9  Non insulin dependent 100 each 3  . labetalol (NORMODYNE) 100 MG tablet TAKE 1 TABLET TWICE A DAY 180 tablet 1  . lisinopril (PRINIVIL,ZESTRIL) 10 MG tablet Take 1 tablet (10 mg total) by mouth daily. 90 tablet 3  . megestrol (MEGACE) 40 MG tablet Take 1 tablet (40 mg total) by mouth 2 (two) times daily. Can increase to two tablets twice a day in the event of heavy bleeding 60 tablet 5  . metformin (FORTAMET) 1000 MG (OSM) 24 hr tablet Take 2 tablets (2,000 mg total) by mouth daily with breakfast. 180 tablet 3  . ONETOUCH DELICA LANCETS 40J MISC Use as instructed to test blood sugar once daily and as needed.  Diagnosis:  E11.9  Non insulin dependent. 100 each 3  . triamterene-hydrochlorothiazide (DYAZIDE) 37.5-25 MG capsule TAKE 1 CAPSULE EVERY       MORNING 90 capsule 1   No current facility-administered medications on file prior to  visit.     Allergies  Allergen Reactions  . Amlodipine     Avoid CCBs due to gum disease.   . Calcium Channel Blockers     Avoid CCBs other than diltiazem due to gum disease.     Family History  Problem Relation Age of Onset  . Heart disease Mother   . Hypertension Mother   . Diabetes Mother   . Hypertension Father   . Hyperlipidemia Father   . Pulmonary embolism Father   . Diabetes Maternal Grandmother   . Stroke Maternal Grandmother   . Heart disease Paternal Grandfather   . Hypertension Paternal Grandfather   . Breast cancer Other   . Breast cancer Paternal Grandmother   . Colon cancer Neg Hx     BP 108/74   Pulse 69   Wt 202 lb 3.2 oz (91.7 kg)   SpO2 98%   BMI 34.71 kg/m    Review of Systems denies weight loss, blurry vision, headache, chest pain, sob, n/v, muscle cramps, excessive diaphoresis, depression, cold  intolerance, rhinorrhea, hypoglycemia, and easy bruising.  She has frequent urination.    Objective:   Physical Exam VS: see vs page GEN: no distress HEAD: head: no deformity eyes: no periorbital swelling, no proptosis external nose and ears are normal mouth: no lesion seen NECK: thyroid is slightly enlarged, with irreg surfacce CHEST WALL: no deformity LUNGS: clear to auscultation CV: reg rate and rhythm, no murmur ABD: abdomen is soft, nontender.  no hepatosplenomegaly.  not distended.  no hernia MUSCULOSKELETAL: muscle bulk and strength are grossly normal.  no obvious joint swelling.  gait is normal and steady EXTEMITIES: no deformity.  no ulcer on the feet.  feet are of normal color and temp.  no edema PULSES: dorsalis pedis intact bilat.  no carotid bruit NEURO:  cn 2-12 grossly intact.   readily moves all 4's.  sensation is intact to touch on the feet SKIN:  Normal texture and temperature.  No rash or suspicious lesion is visible.   NODES:  None palpable at the neck PSYCH: alert, well-oriented.  Does not appear anxious nor depressed.    Lab Results  Component Value Date   HGBA1C 6.5 10/15/2016   Lab Results  Component Value Date   CREATININE 1.24 (H) 07/21/2016   BUN 17 07/21/2016   NA 138 07/21/2016   K 4.6 07/21/2016   CL 101 07/21/2016   CO2 27 07/21/2016   outside test results are reviewed: TSH=5.7  I personally reviewed electrocardiogram tracing (03/05/15): Indication: chest pain Impression: NSR.  QS complex in V1.  No hypertrophy. No comparison is available     Assessment & Plan:  Type 2 DM: overcontrolled, for this SU-containing regimen Renal insuff: in this setting, he should decrease or d/c glipizide.  We discussed.  she declines to completely decrease the glipizide completely, for now.   Patient Instructions  good diet and exercise significantly improve the control of your diabetes.  please let me know if you wish to be referred to a dietician.  high  blood sugar is very risky to your health.  you should see an eye doctor and dentist every year.  It is very important to get all recommended vaccinations.  Controlling your blood pressure and cholesterol drastically reduces the damage diabetes does to your body.  Those who smoke should quit.  Please discuss these with your doctor.  check your blood sugar once a day.  vary the time of day when you check, between  before the 3 meals, and at bedtime.  also check if you have symptoms of your blood sugar being too high or too low.  please keep a record of the readings and bring it to your next appointment here (or you can bring the meter itself).  You can write it on any piece of paper.  please call us sooner if your blood sugar goes below 70, or if you have a lot of readings over 200.  Please come back for a follow-up appointment in 2 months, when you will be due for another thyroid blood test.   Please reduce the glipizide to 10 mg each morning, and none in the evening.

## 2016-11-15 ENCOUNTER — Telehealth: Payer: Self-pay | Admitting: Endocrinology

## 2016-11-15 ENCOUNTER — Other Ambulatory Visit: Payer: Self-pay | Admitting: Family Medicine

## 2016-11-15 NOTE — Telephone Encounter (Signed)
Received refill electronically Last office visit 07/21/16 Last refill 12/02/15 #90/3 See allergy/contraindication

## 2016-11-15 NOTE — Telephone Encounter (Signed)
Sent. Thanks.   

## 2016-11-15 NOTE — Telephone Encounter (Signed)
Patient is requesting that her A1C numbers be sent to Dr. Yvette Rack with [facility] fax: [phone]. Also, her 10/15 visit will need the A1C numbers faxed as well. Please advise.

## 2016-11-15 NOTE — Telephone Encounter (Signed)
I have faxed over

## 2016-11-23 ENCOUNTER — Telehealth: Payer: Self-pay | Admitting: Family Medicine

## 2016-11-23 DIAGNOSIS — R809 Proteinuria, unspecified: Principal | ICD-10-CM

## 2016-11-23 DIAGNOSIS — E1129 Type 2 diabetes mellitus with other diabetic kidney complication: Secondary | ICD-10-CM

## 2016-11-23 NOTE — Telephone Encounter (Signed)
Patient scheduled f/u appointment with Dr.Duncan on 11/29/16.  Patient is coming in for lab work on 11/26/16.  Patient wants to make sure that her lipids are checked and A1c.

## 2016-11-24 NOTE — Telephone Encounter (Signed)
Patient notified by telephone that both ordered per Dr. Damita Dunnings.

## 2016-11-24 NOTE — Telephone Encounter (Signed)
Ordered both.  Thanks .

## 2016-11-26 ENCOUNTER — Other Ambulatory Visit (INDEPENDENT_AMBULATORY_CARE_PROVIDER_SITE_OTHER): Payer: Managed Care, Other (non HMO)

## 2016-11-26 DIAGNOSIS — R809 Proteinuria, unspecified: Secondary | ICD-10-CM

## 2016-11-26 DIAGNOSIS — E1129 Type 2 diabetes mellitus with other diabetic kidney complication: Secondary | ICD-10-CM

## 2016-11-26 LAB — LIPID PANEL
CHOL/HDL RATIO: 3
Cholesterol: 96 mg/dL (ref 0–200)
HDL: 33.1 mg/dL — AB (ref >39.00)
LDL Cholesterol: 47 mg/dL (ref 0–99)
NONHDL: 62.94
Triglycerides: 79 mg/dL (ref 0.0–149.0)
VLDL: 15.8 mg/dL (ref 0.0–40.0)

## 2016-11-26 LAB — HEMOGLOBIN A1C: Hgb A1c MFr Bld: 6.8 % — ABNORMAL HIGH (ref 4.6–6.5)

## 2016-11-29 ENCOUNTER — Ambulatory Visit (INDEPENDENT_AMBULATORY_CARE_PROVIDER_SITE_OTHER): Payer: Managed Care, Other (non HMO) | Admitting: Family Medicine

## 2016-11-29 ENCOUNTER — Ambulatory Visit (INDEPENDENT_AMBULATORY_CARE_PROVIDER_SITE_OTHER): Payer: Managed Care, Other (non HMO) | Admitting: Endocrinology

## 2016-11-29 ENCOUNTER — Encounter: Payer: Self-pay | Admitting: Family Medicine

## 2016-11-29 ENCOUNTER — Encounter: Payer: Self-pay | Admitting: Endocrinology

## 2016-11-29 VITALS — BP 102/70 | HR 81 | Temp 98.3°F | Wt 200.0 lb

## 2016-11-29 VITALS — BP 110/80 | HR 79 | Wt 199.0 lb

## 2016-11-29 DIAGNOSIS — Z23 Encounter for immunization: Secondary | ICD-10-CM

## 2016-11-29 DIAGNOSIS — D219 Benign neoplasm of connective and other soft tissue, unspecified: Secondary | ICD-10-CM | POA: Diagnosis not present

## 2016-11-29 DIAGNOSIS — R809 Proteinuria, unspecified: Secondary | ICD-10-CM

## 2016-11-29 DIAGNOSIS — I10 Essential (primary) hypertension: Secondary | ICD-10-CM

## 2016-11-29 DIAGNOSIS — E1129 Type 2 diabetes mellitus with other diabetic kidney complication: Secondary | ICD-10-CM | POA: Diagnosis not present

## 2016-11-29 DIAGNOSIS — E785 Hyperlipidemia, unspecified: Secondary | ICD-10-CM | POA: Diagnosis not present

## 2016-11-29 DIAGNOSIS — R7989 Other specified abnormal findings of blood chemistry: Secondary | ICD-10-CM | POA: Diagnosis not present

## 2016-11-29 LAB — TSH: TSH: 2.62 u[IU]/mL (ref 0.35–4.50)

## 2016-11-29 MED ORDER — GLIPIZIDE 5 MG PO TABS: 5.0000 mg | ORAL_TABLET | Freq: Every day | ORAL | 3 refills | Status: DC

## 2016-11-29 NOTE — Patient Instructions (Signed)
Take care.  Update me as needed.   Recheck in 2019 when you are up and moving well, after your surgery.  Glad to see you.   Thanks for your effort.

## 2016-11-29 NOTE — Progress Notes (Signed)
Subjective:    Patient ID: Susan Bridges, female    DOB: 01-12-1981, 36 y.o.   MRN: [id number]  HPI Pt returns for f/u of diabetes mellitus: DM type: 2 Dx'ed: 5170 Complications: renal insuff Therapy: 2 oral meds GDM: never (G0) DKA: never Severe hypoglycemia: never Pancreatitis: never Pancreatic imaging: normal on 2018 CT Other: she had right adrenalectomy in 2013, for primary hyperaldosteronism. Interval history: Pt says cbg's vary from 80-100's.  pt states she feels well in general.  She will have TAH soon.   Past Medical History:  Diagnosis Date  . Anemia    from heavy menses  . Diabetes mellitus   . Fibroadenoma of breast   . Hyperlipidemia   . Hypertension   . Obesity   . OSA (obstructive sleep apnea)    s/p UPPP  . Uterine fibroid     Past Surgical History:  Procedure Laterality Date  . ADRENALECTOMY  2013   Right, done at [facility]  . BREAST BIOPSY    . BREAST EXCISIONAL BIOPSY Left 06/15/2008   benign  . TONSILLECTOMY AND ADENOIDECTOMY    . UVULOPALATOPHARYNGOPLASTY      Social History   Social History  . Marital status: Single    Spouse name: N/A  . Number of children: N/A  . Years of education: N/A   Occupational History  . Not on file.   Social History Main Topics  . Smoking status: Never Smoker  . Smokeless tobacco: Never Used  . Alcohol use 0.0 oz/week     Comment: socially  . Drug use: No  . Sexual activity: Not Currently    Partners: Male    Birth control/ protection: None   Other Topics Concern  . 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 atorvastatin (LIPITOR) 20 MG tablet TAKE 1 TABLET DAILY 90 tablet 1  . diltiazem (TIAZAC) 420 MG 24 hr capsule TAKE 1 CAPSULE DAILY 90 capsule 1  . glucose blood (ONETOUCH VERIO) test strip Use as instructed to check blood sugar once daily and  as needed.  Diagnosis: E11.9  Non insulin dependent 100 each 3  . labetalol (NORMODYNE) 100 MG tablet TAKE 1 TABLET TWICE A DAY 180 tablet 1  . lisinopril (PRINIVIL,ZESTRIL) 10 MG tablet Take 1 tablet (10 mg total) by mouth daily. 90 tablet 3  . metformin (FORTAMET) 1000 MG (OSM) 24 hr tablet Take 2 tablets (2,000 mg total) by mouth daily with breakfast. 180 tablet 3  . ONETOUCH DELICA LANCETS 01V MISC Use as instructed to test blood sugar once daily and as needed.  Diagnosis:  E11.9  Non insulin dependent. 100 each 3  . triamterene-hydrochlorothiazide (DYAZIDE) 37.5-25 MG capsule TAKE 1 CAPSULE EVERY       MORNING 90 capsule 1   No current facility-administered medications on file prior to visit.     Allergies  Allergen Reactions  . Amlodipine     Avoid CCBs due to gum disease.   . Calcium Channel Blockers     Avoid CCBs other than diltiazem due to gum disease.     Family History  Problem Relation Age of Onset  . Heart disease Mother   . Hypertension Mother   . Diabetes Mother   . Hypertension Father   . Hyperlipidemia Father   . Pulmonary embolism  Father   . Diabetes Maternal Grandmother   . Stroke Maternal Grandmother   . Heart disease Paternal Grandfather   . Hypertension Paternal Grandfather   . Breast cancer Other   . Breast cancer Paternal Grandmother   . Colon cancer Neg Hx     BP 110/80   Pulse 79   Wt 199 lb (90.3 kg)   SpO2 (!) 80%   BMI 34.16 kg/m   Review of Systems She denies hypoglycemia    Objective:   Physical Exam VITAL SIGNS:  See vs page GENERAL: no distress Pulses: foot pulses are intact bilaterally.   MSK: no deformity of the feet or ankles.  CV: no edema of the legs or ankles.  Skin:  no ulcer on the feet or ankles.  normal color and temp on the feet and ankles.   Neuro: sensation is intact to touch on the feet and ankles.   Ext: There is bilateral onychomycosis of the toenails.     Lab Results  Component Value Date   HGBA1C 6.8 (H)  11/26/2016   Lab Results  Component Value Date   CREATININE 1.24 (H) 07/21/2016   BUN 17 07/21/2016   NA 138 07/21/2016   K 4.6 07/21/2016   CL 101 07/21/2016   CO2 27 07/21/2016      Assessment & Plan:  Type 2 DM with renal insuff: overcontrolled, for this SU-containing regimen.    Patient Instructions  check your blood sugar once a day.  vary the time of day when you check, between before the 3 meals, and at bedtime.  also check if you have symptoms of your blood sugar being too high or too low.  please keep a record of the readings and bring it to your next appointment here (or you can bring the meter itself).  You can write it on any piece of paper.  please call us sooner if your blood sugar goes below 70, or if you have a lot of readings over 200.  Please come back for a follow-up appointment in 3 months.   Please reduce the glipizide to 5 mg each morning, and none in the evening.

## 2016-11-29 NOTE — Progress Notes (Signed)
Elevated Cholesterol: Using medications without problems:yes Muscle aches:  Some occ aches but not consistent, may be related to fluid intake.  Likely not statin related.   Diet compliance:yes Exercise:encouraged, d/w pt.    DM2. A1c improved.  Per endo.  She cut out sodas.    Hypertension:    Using medication without problems or lightheadedness: yes Chest pain with exertion:no Edema:no Short of breath:no  TSH normal today.  D/w pt.  Per endo.    She has surgery planned with [facility].  she has had all of the appropriate preop workup and she has had time to work through the decision-making process to go through with surgery. Discussed with patient. I appreciate the help of all involved.  Meds, vitals, and allergies reviewed.   PMH and SH reviewed  ROS: Per HPI unless specifically indicated in ROS section   GEN: nad, alert and oriented HEENT: mucous membranes moist NECK: supple w/o LA CV: rrr. PULM: ctab, no inc wob ABD: soft, +bs EXT: no edema SKIN: no acute rash

## 2016-11-29 NOTE — Patient Instructions (Addendum)
check your blood sugar once a day.  vary the time of day when you check, between before the 3 meals, and at bedtime.  also check if you have symptoms of your blood sugar being too high or too low.  please keep a record of the readings and bring it to your next appointment here (or you can bring the meter itself).  You can write it on any piece of paper.  please call us sooner if your blood sugar goes below 70, or if you have a lot of readings over 200.  Please come back for a follow-up appointment in 3 months.   Please reduce the glipizide to 5 mg each morning, and none in the evening.

## 2016-11-30 DIAGNOSIS — R7989 Other specified abnormal findings of blood chemistry: Secondary | ICD-10-CM | POA: Insufficient documentation

## 2016-11-30 NOTE — Assessment & Plan Note (Addendum)
Controlled. Continue as is. No changes statin. Continue work on diet and exercise. Intentional weight loss noted. >25 minutes spent in face to face time with patient, >50% spent in counselling or coordination of care

## 2016-11-30 NOTE — Assessment & Plan Note (Signed)
History of, normalized now.

## 2016-11-30 NOTE — Assessment & Plan Note (Signed)
With surgery planned at [facility]. See above. Discussed with patient.

## 2016-11-30 NOTE — Assessment & Plan Note (Signed)
Controlled. No change in meds. She agrees.

## 2016-12-06 ENCOUNTER — Other Ambulatory Visit: Payer: Self-pay | Admitting: Family Medicine

## 2016-12-07 ENCOUNTER — Other Ambulatory Visit: Payer: Self-pay | Admitting: Family Medicine

## 2016-12-31 ENCOUNTER — Other Ambulatory Visit: Payer: Self-pay | Admitting: Family Medicine

## 2017-01-27 ENCOUNTER — Other Ambulatory Visit: Payer: Self-pay | Admitting: *Deleted

## 2017-01-27 MED ORDER — GLUCOSE BLOOD VI STRP: ORAL_STRIP | 3 refills | Status: DC

## 2017-02-23 ENCOUNTER — Other Ambulatory Visit: Payer: Self-pay | Admitting: Family Medicine

## 2017-03-01 ENCOUNTER — Ambulatory Visit: Payer: Managed Care, Other (non HMO) | Admitting: Endocrinology

## 2017-03-01 ENCOUNTER — Encounter: Payer: Self-pay | Admitting: Endocrinology

## 2017-03-01 VITALS — BP 124/82 | HR 86 | Wt 198.0 lb

## 2017-03-01 DIAGNOSIS — E1129 Type 2 diabetes mellitus with other diabetic kidney complication: Secondary | ICD-10-CM | POA: Diagnosis not present

## 2017-03-01 DIAGNOSIS — R809 Proteinuria, unspecified: Secondary | ICD-10-CM

## 2017-03-01 LAB — POCT GLYCOSYLATED HEMOGLOBIN (HGB A1C): Hemoglobin A1C: 7.4

## 2017-03-01 MED ORDER — METFORMIN HCL ER (OSM) 1000 MG PO TB24: 1000.0000 mg | ORAL_TABLET | Freq: Every day | ORAL | 3 refills | Status: DC

## 2017-03-01 MED ORDER — SITAGLIPTIN PHOSPHATE 50 MG PO TABS: 50.0000 mg | ORAL_TABLET | Freq: Every day | ORAL | 3 refills | Status: DC

## 2017-03-01 NOTE — Progress Notes (Addendum)
Subjective:    Patient ID: Susan Bridges, female    DOB: September 01, 1980, 37 y.o.   MRN: [id number]  HPI Pt returns for f/u of diabetes mellitus: DM type: 2 Dx'ed: 4098 Complications: renal insuff Therapy: 2 oral meds GDM: never (G0) DKA: never Severe hypoglycemia: never Pancreatitis: never Pancreatic imaging: normal on 2018 CT Other: she had right adrenalectomy in 2013, for primary hyperaldosteronism.   Interval history: Pt says cbg's are well-controlled.  pt states she feels well in general.   Past Medical History:  Diagnosis Date  . Anemia    from heavy menses  . Diabetes mellitus   . Fibroadenoma of breast   . Hyperlipidemia   . Hypertension   . Obesity   . OSA (obstructive sleep apnea)    s/p UPPP  . Uterine fibroid     Past Surgical History:  Procedure Laterality Date  . ADRENALECTOMY  2013   Right, done [facility]  . BREAST BIOPSY    . BREAST EXCISIONAL BIOPSY Left 06/15/2008   benign  . TONSILLECTOMY AND ADENOIDECTOMY    . UVULOPALATOPHARYNGOPLASTY      Social History   Socioeconomic History  . Marital status: Single    Spouse name: Not on file  . Number of children: Not on file  . Years of education: Not on file  . Highest education level: Not on file  Social Needs  . Financial resource strain: Not on file  . Food insecurity - worry: Not on file  . Food insecurity - inability: Not on file  . Transportation needs - medical: Not on file  . Transportation needs - non-medical: Not on file  Occupational History  . Not on file  Tobacco Use  . Smoking status: Never Smoker  . Smokeless tobacco: Never Used  Substance and Sexual Activity  . Alcohol use: Yes    Alcohol/week: 0.0 oz    Comment: socially  . Drug use: No  . Sexual activity: Not Currently    Partners: Male    Birth control/protection: None  Other Topics Concern  . 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 atorvastatin (LIPITOR) 20 MG tablet TAKE 1 TABLET DAILY 90 tablet 1  . diltiazem (TIAZAC) 420 MG 24 hr capsule TAKE 1 CAPSULE DAILY 90 capsule 1  . glipiZIDE (GLUCOTROL) 5 MG tablet Take 1 tablet (5 mg total) by mouth daily before breakfast. 90 tablet 3  . glucose blood (ONETOUCH VERIO) test strip Use as instructed to check blood sugar once daily and as needed.  Diagnosis: E11.9  Non insulin dependent 100 each 3  . labetalol (NORMODYNE) 100 MG tablet TAKE 1 TABLET TWICE A DAY 180 tablet 1  . lisinopril (PRINIVIL,ZESTRIL) 10 MG tablet TAKE 1 TABLET DAILY 90 tablet 0  . ONETOUCH DELICA LANCETS 11B MISC Use as instructed to test blood sugar once daily and as needed.  Diagnosis:  E11.9  Non insulin dependent. 100 each 3  . triamterene-hydrochlorothiazide (DYAZIDE) 37.5-25 MG capsule TAKE 1 CAPSULE EVERY       MORNING 90 capsule 1  . atorvastatin (LIPITOR) 20 MG tablet TAKE 1 TABLET DAILY (Patient not taking: Reported on 03/01/2017) 90 tablet 1   No current facility-administered medications on file prior to visit.     Allergies  Allergen Reactions  . Amlodipine  Avoid CCBs due to gum disease.   . Calcium Channel Blockers     Avoid CCBs other than diltiazem due to gum disease.     Family History  Problem Relation Age of Onset  . Heart disease Mother   . Hypertension Mother   . Diabetes Mother   . Hypertension Father   . Hyperlipidemia Father   . Pulmonary embolism Father   . Diabetes Maternal Grandmother   . Stroke Maternal Grandmother   . Heart disease Paternal Grandfather   . Hypertension Paternal Grandfather   . Breast cancer Other   . Breast cancer Paternal Grandmother   . Colon cancer Neg Hx     BP 124/82 (BP Location: Left Arm, Patient Position: Sitting, Cuff Size: Normal)   Pulse 86   Wt 198 lb (89.8 kg)   SpO2 97%   BMI 33.99 kg/m    Review of Systems She denies hypoglycemia.     Objective:    Physical Exam VITAL SIGNS:  See vs page GENERAL: no distress Pulses: dorsalis pedis intact bilat.   MSK: no deformity of the feet CV: no leg edema Skin:  no ulcer on the feet.  normal color and temp on the feet. Neuro: sensation is intact to touch on the feet.    Lab Results  Component Value Date   CREATININE 1.24 (H) 07/21/2016   BUN 17 07/21/2016   NA 138 07/21/2016   K 4.6 07/21/2016   CL 101 07/21/2016   CO2 27 07/21/2016    Lab Results  Component Value Date   HGBA1C 7.4 03/01/2017       Assessment & Plan:  Type 2 DM: this is the best control this pt should aim for, given this SU-containing regimen Renal insuff: she needs to minimize metformin and phase out SU if possible.  Patient Instructions  check your blood sugar once a day.  vary the time of day when you check, between before the 3 meals, and at bedtime.  also check if you have symptoms of your blood sugar being too high or too low.  please keep a record of the readings and bring it to your next appointment here (or you can bring the meter itself).  You can write it on any piece of paper.  please call us sooner if your blood sugar goes below 70, or if you have a lot of readings over 200.  I have sent 2 prescriptions to your pharmacy: to half the metformin, and to start [place].   Please continue the same glipizide for now.  Please come back for a follow-up appointment in 2-3 months.

## 2017-03-01 NOTE — Patient Instructions (Addendum)
check your blood sugar once a day.  vary the time of day when you check, between before the 3 meals, and at bedtime.  also check if you have symptoms of your blood sugar being too high or too low.  please keep a record of the readings and bring it to your next appointment here (or you can bring the meter itself).  You can write it on any piece of paper.  please call us sooner if your blood sugar goes below 70, or if you have a lot of readings over 200.  I have sent 2 prescriptions to your pharmacy: to half the metformin, and to start [place].   Please continue the same glipizide for now.  Please come back for a follow-up appointment in 2-3 months.

## 2017-03-04 ENCOUNTER — Telehealth: Payer: Self-pay | Admitting: Endocrinology

## 2017-03-04 ENCOUNTER — Other Ambulatory Visit: Payer: Self-pay

## 2017-03-04 NOTE — Telephone Encounter (Signed)
I called and left patient VM. I stated she could call back if she had further questions.

## 2017-03-04 NOTE — Telephone Encounter (Addendum)
Patient stated that Dr Loanne Drilling told her he will be prescribing her medication Jardiance, but he sent to the pharmacy  [facility]) which is the wrong medication.  She stated that she was not taking that medication until someone call her back and let her know if it is the right medication to take.

## 2017-03-04 NOTE — Telephone Encounter (Signed)
We discussed jardiance, but Susan Bridges is best option for now.  We may need to add jardiance in the future, though

## 2017-03-16 ENCOUNTER — Other Ambulatory Visit: Payer: Self-pay

## 2017-03-16 ENCOUNTER — Telehealth: Payer: Self-pay

## 2017-03-16 MED ORDER — METFORMIN HCL ER 500 MG PO TB24: 500.0000 mg | ORAL_TABLET | Freq: Every day | ORAL | 3 refills | Status: DC

## 2017-03-16 NOTE — Telephone Encounter (Signed)
Left a vm letting patient know that metformin er 1000mg  was no longer covered by insurance so I switched it to glucophage xr 500 per Dr. Loanne Drilling

## 2017-03-18 ENCOUNTER — Telehealth: Payer: Self-pay | Admitting: Family Medicine

## 2017-03-18 ENCOUNTER — Other Ambulatory Visit: Payer: Self-pay | Admitting: *Deleted

## 2017-03-18 MED ORDER — ACCU-CHEK AVIVA PLUS W/DEVICE KIT: PACK | 0 refills | Status: DC

## 2017-03-18 NOTE — Telephone Encounter (Signed)
Copied from [place] [id number]. Topic: Quick Communication - See Telephone Encounter >> Mar 18, 2017  9:27 AM Bea Graff, NT wrote: CRM for notification. See Telephone encounter for: Susan Bridges from [facility] calling and states they have rx  for One Touch diabetic equipment but they need a new rx for either AccuCheck Aveta plus or compact plus. Her insurance will not cover the One Touch. CB#: [phone]  03/18/17.

## 2017-03-18 NOTE — Telephone Encounter (Signed)
Rx sent to [facility].

## 2017-04-27 ENCOUNTER — Telehealth: Payer: Self-pay | Admitting: Endocrinology

## 2017-04-27 MED ORDER — FREESTYLE LIBRE 14 DAY READER DEVI: 1.0000 | Freq: Once | 0 refills | Status: AC

## 2017-04-27 MED ORDER — FREESTYLE LIBRE 14 DAY SENSOR MISC: 1.0000 | 3 refills | Status: DC

## 2017-04-27 NOTE — Telephone Encounter (Signed)
LVM that [facility] prescription was sent.

## 2017-04-27 NOTE — Telephone Encounter (Signed)
I have sent a prescription to your pharmacy  

## 2017-04-27 NOTE — Telephone Encounter (Signed)
Patient wants a RX for [place] 14 day and any supplies needed for it sent to [facility] in [place]. It is covered on her insurance.

## 2017-05-04 ENCOUNTER — Other Ambulatory Visit: Payer: Self-pay | Admitting: Family Medicine

## 2017-05-06 ENCOUNTER — Ambulatory Visit: Payer: Managed Care, Other (non HMO) | Admitting: Endocrinology

## 2017-05-24 ENCOUNTER — Other Ambulatory Visit: Payer: Self-pay | Admitting: Family Medicine

## 2017-06-08 ENCOUNTER — Telehealth: Payer: Self-pay

## 2017-06-08 ENCOUNTER — Other Ambulatory Visit: Payer: Self-pay | Admitting: Family Medicine

## 2017-06-08 NOTE — Telephone Encounter (Signed)
Electronic refill request Last office visit 11/29/16 See drug warning regarding duplication of medications

## 2017-06-08 NOTE — Telephone Encounter (Signed)
Copied from [place]. Topic: Referral - Request >> Jun 08, 2017  4:50 PM Bea Graff, NT wrote: Reason for CRM: Nate with [facility] is needing office notes from where pt was referred to them July of last year. Her appointment has been scheduled with them tomorrow, 06/09/17 @ 10:00am. They would like this paperwork before then if possible. Fax#: [phone] Attn: Nate. CB#: [phone]

## 2017-06-08 NOTE — Telephone Encounter (Deleted)
Electronic refill request Last office visit 11/29/16 See warnings with duplication of medications

## 2017-06-09 ENCOUNTER — Encounter: Payer: Self-pay | Admitting: *Deleted

## 2017-06-09 NOTE — Telephone Encounter (Signed)
REcords faxed to [facility] Attn Nate and patient notified that her records were being faxed.

## 2017-06-09 NOTE — Telephone Encounter (Signed)
Okay to continue.  Thanks.  Sent.   Needs f/u OV.

## 2017-07-14 ENCOUNTER — Ambulatory Visit: Payer: Managed Care, Other (non HMO) | Admitting: Family Medicine

## 2017-07-14 ENCOUNTER — Encounter: Payer: Self-pay | Admitting: Family Medicine

## 2017-07-14 VITALS — BP 98/70 | HR 77 | Temp 98.5°F | Ht 64.0 in | Wt 195.8 lb

## 2017-07-14 DIAGNOSIS — I10 Essential (primary) hypertension: Secondary | ICD-10-CM

## 2017-07-14 NOTE — Patient Instructions (Addendum)
If you are getting lightheaded then let me know.  Take care.  Glad to see you.  Go to the lab on the way out.  We'll contact you with your lab report. Recheck at a physical in about 6 months.

## 2017-07-14 NOTE — Progress Notes (Signed)
Hypertension:    Using medication without problems or lightheadedness: yes Chest pain with exertion: no Edema: no Short of breath: no Labs d/w pt.   She is seeing Dr. Gabriel Carina with endo.  I'll defer to her.  Pt agrees.   Work is still busy, d/w pt.  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   Meds, vitals, and allergies reviewed.   PMH and SH reviewed  ROS: Per HPI unless specifically indicated in ROS section   GEN: nad, alert and oriented HEENT: mucous membranes moist NECK: supple w/o LA CV: rrr. PULM: ctab, no inc wob ABD: soft, +bs EXT: no edema SKIN: well perfused.

## 2017-07-15 LAB — BASIC METABOLIC PANEL
BUN: 14 mg/dL (ref 6–23)
CALCIUM: 9.8 mg/dL (ref 8.4–10.5)
CHLORIDE: 100 meq/L (ref 96–112)
CO2: 29 mEq/L (ref 19–32)
Creatinine, Ser: 1.11 mg/dL (ref 0.40–1.20)
GFR: 70.48 mL/min (ref >60.00)
Glucose, Bld: 97 mg/dL (ref 70–99)
POTASSIUM: 4.6 meq/L (ref 3.5–5.1)
SODIUM: 138 meq/L (ref 135–145)

## 2017-07-15 NOTE — Assessment & Plan Note (Signed)
Controlled.  No change in meds at this point.  If she gets lightheaded then she will let me know.  See notes on labs.  Okay for outpatient follow-up.  Continue work on diet and exercise.  She agrees.  Recheck in about 6 months.

## 2017-07-17 ENCOUNTER — Encounter: Payer: Self-pay | Admitting: Family Medicine

## 2017-07-17 ENCOUNTER — Other Ambulatory Visit: Payer: Self-pay | Admitting: Family Medicine

## 2017-07-17 MED ORDER — LISINOPRIL 10 MG PO TABS: 10.0000 mg | ORAL_TABLET | Freq: Every day | ORAL | 3 refills | Status: DC

## 2017-07-17 MED ORDER — DILTIAZEM HCL ER BEADS 420 MG PO CP24: 420.0000 mg | ORAL_CAPSULE | Freq: Every day | ORAL | 3 refills | Status: DC

## 2017-07-17 MED ORDER — LABETALOL HCL 100 MG PO TABS: ORAL_TABLET | ORAL | 3 refills | Status: DC

## 2017-07-17 MED ORDER — ATORVASTATIN CALCIUM 20 MG PO TABS
20.0000 mg | ORAL_TABLET | Freq: Every day | ORAL | 3 refills | Status: DC
Start: 2017-07-17 — End: 2017-08-01

## 2017-07-17 MED ORDER — TRIAMTERENE-HCTZ 37.5-25 MG PO CAPS: 1.0000 | ORAL_CAPSULE | Freq: Every morning | ORAL | 3 refills | Status: DC

## 2017-07-18 ENCOUNTER — Telehealth: Payer: Self-pay | Admitting: Family Medicine

## 2017-07-18 NOTE — Telephone Encounter (Signed)
Patient says that over the weekend, when she transitioned to a standing position, she became light-headed and had to stand for a while and blink before she could get her bearings.  The Triamterene-HCTZ is a capsule.  Please advise.

## 2017-07-18 NOTE — Telephone Encounter (Signed)
Copied from [place] [phone]. Topic: Quick Communication - See Telephone Encounter >> Jul 18, 2017 11:57 AM Percell Belt A wrote: CRM for notification. See Telephone encounter for: 07/18/17.  Pt called in and stated she thought someone was going to contact her about the adjustment of her BP meds.  She feels like she needs to have it adjusted .    Please advise  Best number  [phone]

## 2017-07-18 NOTE — Telephone Encounter (Signed)
If lightheaded, then we should taper the triamterene/HCTZ to a lower dose.  Let me know if she is lightheaded.  Thanks.

## 2017-07-19 ENCOUNTER — Telehealth: Payer: Self-pay | Admitting: Family Medicine

## 2017-07-19 MED ORDER — HYDROCHLOROTHIAZIDE 25 MG PO TABS: 12.5000 mg | ORAL_TABLET | Freq: Every day | ORAL | 3 refills | Status: DC

## 2017-07-19 NOTE — Telephone Encounter (Signed)
Left detailed message on voicemail with instructions to call if any questions about these instructions.

## 2017-07-19 NOTE — Telephone Encounter (Signed)
Copied from [place] [id number]. Topic: Quick Communication - See Telephone Encounter >> Jul 19, 2017 12:39 PM Hewitt Shorts wrote: Pt is wanting to know if a rx can be written for a blood pressure check machine the machine that fits the wrist   Best number [phone]   [facility] [address] [facility]

## 2017-07-19 NOTE — Telephone Encounter (Signed)
Would stop the triamterene HCTZ totally.  Please cancel that rx or have patient cancel it.  rx sent for HCTZ (without triamterene).  If BP is consisently >130/>90 off med, then start taking 12.5mg  a day (1/2 tab).  If BP still up after 5 days of that, then change to 25mg  (1 tab).  Update me as needed.  Thanks.

## 2017-07-20 NOTE — Telephone Encounter (Signed)
Pt last seen 07/14/17. Please advise.

## 2017-07-21 NOTE — Telephone Encounter (Signed)
Spoke to pt. She will find out from insurance if she needs a letter from her PCP for a BP Cuff.

## 2017-07-21 NOTE — Telephone Encounter (Signed)
I would not use a wrist cuff.  They tend to be less accurate.  If she wants to get a cuff, I would get a regular cuff that checks higher on the arm.  Please send a letter to her, if needed/if it will help with expense, stating the following:  These allow this patient to get a blood pressure cuff so that she can monitor her blood pressure at home.  Dx. [email or web address].

## 2017-08-01 ENCOUNTER — Telehealth: Payer: Self-pay | Admitting: Family Medicine

## 2017-08-01 MED ORDER — ATORVASTATIN CALCIUM 20 MG PO TABS: 20.0000 mg | ORAL_TABLET | Freq: Every day | ORAL | 3 refills | Status: DC

## 2017-08-01 NOTE — Telephone Encounter (Signed)
I called the pt to clarify her Lipitor Rx.   It was received at the [facility] on 07/17/17 but pt said she never got it.   [facility] [address]     So I sent her Rx for the Lipitor there.  She is going to call the [facility] and see why she didn't get the Rx.   She took her last pill this morning.

## 2017-08-01 NOTE — Telephone Encounter (Signed)
Copied from [place] [id number]. Topic: Quick Communication - Rx Refill/Question >> Aug 01, 2017  8:02 AM Robina Ade, Helene Kelp D wrote: Medication:atorvastatin (LIPITOR) 20 MG tablet  Has the patient contacted their pharmacy? Yes (Agent: If no, request that the patient contact the pharmacy for the refill.) (Agent: If yes, when and what did the pharmacy advise?)  Preferred Pharmacy (with phone number or street name): [id number] [place] [place]: Please be advised that RX refills may take up to 3 business days. We ask that you follow-up with your pharmacy.

## 2017-08-27 ENCOUNTER — Other Ambulatory Visit: Payer: Self-pay | Admitting: Family Medicine

## 2017-10-04 ENCOUNTER — Other Ambulatory Visit: Payer: Self-pay | Admitting: Family Medicine

## 2017-10-04 NOTE — Telephone Encounter (Signed)
Electronic refill request Last office visit 07/14/17 Medication is no longer on medication list

## 2017-10-05 NOTE — Telephone Encounter (Signed)
Per DPR, left detail message of Kate Clark's comments for patient to call back 

## 2017-10-05 NOTE — Telephone Encounter (Signed)
Verify with patient.  I thought she was off med, based on EMR.  Thanks.

## 2017-10-06 NOTE — Telephone Encounter (Signed)
Patient called back. I confirm with patient that she is no longer triamterene-hydrochlorothiazide. The pharmacy request the wrong medication.

## 2017-11-21 ENCOUNTER — Other Ambulatory Visit (INDEPENDENT_AMBULATORY_CARE_PROVIDER_SITE_OTHER): Payer: Managed Care, Other (non HMO)

## 2017-11-21 ENCOUNTER — Other Ambulatory Visit: Payer: Self-pay | Admitting: Family Medicine

## 2017-11-21 DIAGNOSIS — E1129 Type 2 diabetes mellitus with other diabetic kidney complication: Secondary | ICD-10-CM

## 2017-11-21 DIAGNOSIS — R809 Proteinuria, unspecified: Secondary | ICD-10-CM | POA: Diagnosis not present

## 2017-11-21 LAB — LIPID PANEL
CHOL/HDL RATIO: 3
Cholesterol: 103 mg/dL (ref 0–200)
HDL: 39.9 mg/dL (ref >39.00)
LDL Cholesterol: 48 mg/dL (ref 0–99)
NonHDL: 62.95
TRIGLYCERIDES: 76 mg/dL (ref 0.0–149.0)
VLDL: 15.2 mg/dL (ref 0.0–40.0)

## 2017-11-21 LAB — COMPREHENSIVE METABOLIC PANEL WITH GFR
ALT: 11 U/L (ref 0–35)
AST: 12 U/L (ref 0–37)
Albumin: 4.5 g/dL (ref 3.5–5.2)
Alkaline Phosphatase: 43 U/L (ref 39–117)
BUN: 13 mg/dL (ref 6–23)
CO2: 28 meq/L (ref 19–32)
Calcium: 9.5 mg/dL (ref 8.4–10.5)
Chloride: 99 meq/L (ref 96–112)
Creatinine, Ser: 0.95 mg/dL (ref 0.40–1.20)
GFR: 84.19 mL/min
Glucose, Bld: 133 mg/dL — ABNORMAL HIGH (ref 70–99)
Potassium: 3.8 meq/L (ref 3.5–5.1)
Sodium: 136 meq/L (ref 135–145)
Total Bilirubin: 0.2 mg/dL (ref 0.2–1.2)
Total Protein: 7.5 g/dL (ref 6.0–8.3)

## 2017-11-21 LAB — TSH: TSH: 2.28 u[IU]/mL (ref 0.35–4.50)

## 2017-11-21 LAB — HEMOGLOBIN A1C: Hgb A1c MFr Bld: 6.3 % (ref 4.6–6.5)

## 2017-11-29 ENCOUNTER — Ambulatory Visit (INDEPENDENT_AMBULATORY_CARE_PROVIDER_SITE_OTHER): Payer: Managed Care, Other (non HMO) | Admitting: Family Medicine

## 2017-11-29 ENCOUNTER — Telehealth: Payer: Self-pay

## 2017-11-29 ENCOUNTER — Encounter: Payer: Self-pay | Admitting: Family Medicine

## 2017-11-29 VITALS — BP 110/78 | HR 99 | Temp 98.7°F | Ht 64.0 in | Wt 186.5 lb

## 2017-11-29 DIAGNOSIS — Z7189 Other specified counseling: Secondary | ICD-10-CM

## 2017-11-29 DIAGNOSIS — G4733 Obstructive sleep apnea (adult) (pediatric): Secondary | ICD-10-CM

## 2017-11-29 DIAGNOSIS — Z23 Encounter for immunization: Secondary | ICD-10-CM | POA: Diagnosis not present

## 2017-11-29 DIAGNOSIS — R809 Proteinuria, unspecified: Secondary | ICD-10-CM

## 2017-11-29 DIAGNOSIS — Z0000 Encounter for general adult medical examination without abnormal findings: Secondary | ICD-10-CM

## 2017-11-29 DIAGNOSIS — I10 Essential (primary) hypertension: Secondary | ICD-10-CM

## 2017-11-29 DIAGNOSIS — E785 Hyperlipidemia, unspecified: Secondary | ICD-10-CM

## 2017-11-29 DIAGNOSIS — E1129 Type 2 diabetes mellitus with other diabetic kidney complication: Secondary | ICD-10-CM

## 2017-11-29 MED ORDER — DILTIAZEM HCL ER BEADS 420 MG PO CP24: 420.0000 mg | ORAL_CAPSULE | Freq: Every day | ORAL | 0 refills | Status: DC

## 2017-11-29 MED ORDER — HYDROCHLOROTHIAZIDE 25 MG PO TABS: 25.0000 mg | ORAL_TABLET | Freq: Every day | ORAL | Status: DC

## 2017-11-29 MED ORDER — METFORMIN HCL ER (MOD) 1000 MG PO TB24: 1000.0000 mg | ORAL_TABLET | Freq: Two times a day (BID) | ORAL | Status: AC

## 2017-11-29 NOTE — Patient Instructions (Addendum)
We will call about your referral.  Rosaria Ferries or Azalee Course will call you if you don't see one of them on the way out.  Update me as needed.  Take care.  Glad to see you.

## 2017-11-29 NOTE — Telephone Encounter (Signed)
Copied from [place] [phone]. Topic: General - Other >> Nov 29, 2017  3:11 PM Judyann Munson wrote: Reason for CRM: Patient has appt schedule for today at [facility]. 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

## 2017-11-29 NOTE — Telephone Encounter (Signed)
Patient calling and states that she is on her way. Is aware of late policy. States that an emergency come up at work and she may be a few minutes late. Due to the possibility of her having to reschedule, she states that she cannot wait until November to see Dr Damita Dunnings. Would like to be fit in somewhere on his schedule, if she has to reschedule. Please advise.

## 2017-11-29 NOTE — Telephone Encounter (Signed)
I was unable to reach pt to see if pt was coming to appt today.

## 2017-11-29 NOTE — Progress Notes (Signed)
CPE- See plan.  Routine anticipatory guidance given to patient.  See health maintenance.  The possibility exists that previously documented standard health maintenance information may have been brought forward from a previous encounter into this note.  If needed, that same information has been updated to reflect the current situation based on today's encounter.    Tetanus 2014 Flu shot 2019 PNA 2016 She has gyn f/u pending.   Mammogram 2018, d/w pt about follow up.   Living will d/w pt.  Would have father and stepfather designated equally if she were incapacitated.   Diet and exercise encouraged.  She has been working on both.  S/p UPPP, with audible upper airway noise with inhalation intermittently.  She is snoring more.   D/w pt about ENT referral.    DM2 per endocrine.  Still working on diet and exercise, d/w pt.  She is going to f/u with the eye clinic.    Hypertension:    Using medication without problems or lightheadedness: yes Chest pain with exertion:no Edema:no Short of breath:no  Elevated Cholesterol: Using medications without problems:yes Muscle aches: no Diet compliance: yes Exercise:yes labs d/w pt.    PMH and SH reviewed  Meds, vitals, and allergies reviewed.   ROS: Per HPI.  Unless specifically indicated otherwise in HPI, the patient denies:  General: fever. Eyes: acute vision changes ENT: sore throat Cardiovascular: chest pain Respiratory: SOB GI: vomiting GU: dysuria Musculoskeletal: acute back pain Derm: acute rash Neuro: acute motor dysfunction Psych: worsening mood Endocrine: polydipsia Heme: bleeding Allergy: hayfever  GEN: nad, alert and oriented HEENT: mucous membranes moist, I do not hear any audible upper airway noise at the time of the exam.  Discussed with patient. NECK: supple w/o LA CV: rrr. PULM: ctab, no inc wob ABD: soft, +bs EXT: no edema SKIN: no acute rash  Diabetic foot exam: Normal inspection No skin breakdown No  calluses  Normal DP pulses Normal sensation to light touch and monofilament Nails normal

## 2017-11-30 DIAGNOSIS — Z7189 Other specified counseling: Secondary | ICD-10-CM | POA: Insufficient documentation

## 2017-11-30 NOTE — Assessment & Plan Note (Signed)
Continue work on diet and exercise.  No adverse effect on statin.  Continue as is.  She agrees.

## 2017-11-30 NOTE — Assessment & Plan Note (Signed)
Living will d/w pt.  Would have father and stepfather designated equally if she were incapacitated.   

## 2017-11-30 NOTE — Assessment & Plan Note (Signed)
Tetanus 2014 Flu shot 2019 PNA 2016 She has gyn f/u pending.   Mammogram 2018, d/w pt about follow up.   Living will d/w pt.  Would have father and stepfather designated equally if she were incapacitated.   Diet and exercise encouraged.  She has been working on both.

## 2017-11-30 NOTE — Telephone Encounter (Signed)
I talked to the patient about this.  I was not concerned about her coming in for the office visit late.  I was just glad to see her.  Given the nature of her work, we should do everything we can to work around her schedule.  She was really gracious to call ahead in the first place.  I thanked her for her effort.  See office visit note.

## 2017-11-30 NOTE — Assessment & Plan Note (Signed)
She had noticed episodic upper airway noise.  I do not know if she has an anatomic issue that is contributing to her current situation.  I think it would be reasonable to see ENT to see if they have any advice in the meantime.  We can refer her back for another sleep study if needed.  I would like ENT input first.  She agrees.

## 2017-11-30 NOTE — Assessment & Plan Note (Signed)
Per endocrine.  She is working on diet and exercise.  She is going to follow-up with the eye clinic.  Her A1c has improved with diet and exercise and medication.

## 2017-11-30 NOTE — Assessment & Plan Note (Signed)
Blood pressure is improved in the meantime.  If she is getting lightheaded at all then we can cut back on her hydrochlorothiazide.  Update me as needed.  She agrees.

## 2017-12-05 NOTE — Addendum Note (Signed)
Addended by: Josetta Huddle on: 12/05/2017 09:22 AM   Modules accepted: Orders

## 2018-01-11 ENCOUNTER — Other Ambulatory Visit: Payer: Managed Care, Other (non HMO)

## 2018-01-16 ENCOUNTER — Encounter: Payer: Managed Care, Other (non HMO) | Admitting: Family Medicine

## 2018-01-26 LAB — HM DIABETES EYE EXAM

## 2018-02-03 ENCOUNTER — Encounter: Payer: Self-pay | Admitting: Family Medicine

## 2018-02-27 ENCOUNTER — Other Ambulatory Visit: Payer: Self-pay | Admitting: Family Medicine

## 2018-06-07 ENCOUNTER — Other Ambulatory Visit: Payer: Self-pay

## 2018-06-07 MED ORDER — HYDROCHLOROTHIAZIDE 25 MG PO TABS: 25.0000 mg | ORAL_TABLET | Freq: Every day | ORAL | 1 refills | Status: DC

## 2018-08-23 ENCOUNTER — Other Ambulatory Visit: Payer: Self-pay | Admitting: Family Medicine

## 2018-09-09 ENCOUNTER — Other Ambulatory Visit: Payer: Self-pay | Admitting: Family Medicine

## 2018-09-19 IMAGING — CT CT ABD-PELV W/ CM
2 of 4 series · 15 of 46 positions shown, 17 images · IV contrast (APPLIED)
Comparison: Pelvic ultrasound obtained earlier today ; prior CT
scan of the abdomen and pelvis 03/18/2015

CLINICAL DATA: 35-year-old female with right lower quadrant and
pelvic pain for the past 2 weeks

EXAM:
CT ABDOMEN AND PELVIS WITH CONTRAST
TECHNIQUE: Multidetector CT imaging of the abdomen and pelvis was performed
using the standard protocol following bolus administration of
intravenous contrast.
CONTRAST:  100mL XYDVF9-IPP IOPAMIDOL (XYDVF9-IPP) INJECTION 61%

[Series 2: routine abd/pel with · axial · 0.79mm/px · z∈[-497,-57]mm · 12 of 97 slices shown, 14 images]
[im 5/97  soft-tissue]
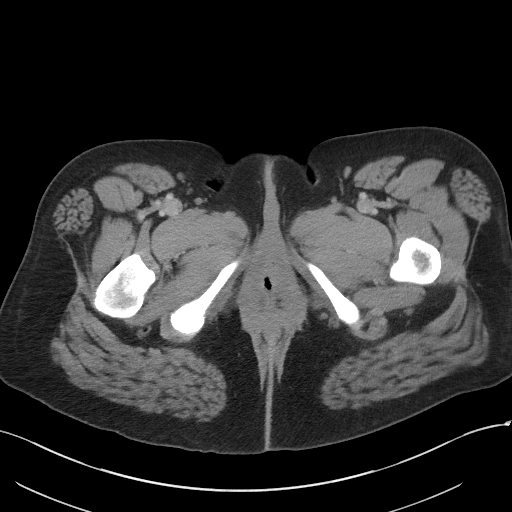
[im 5/97  bone]
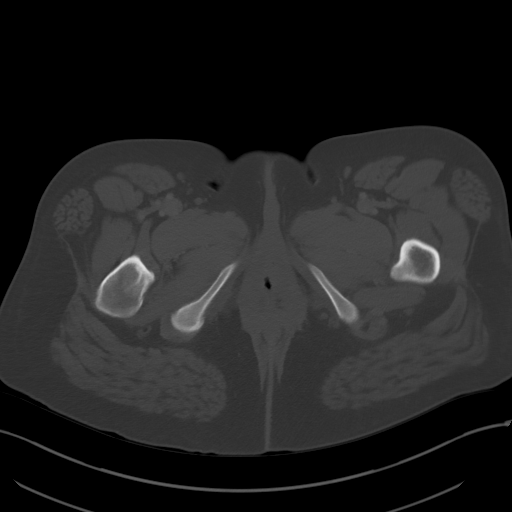
[im 13/97  soft-tissue]
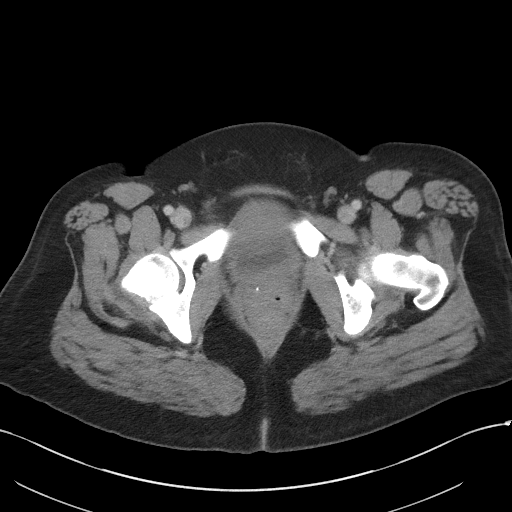
[im 21/97  soft-tissue]
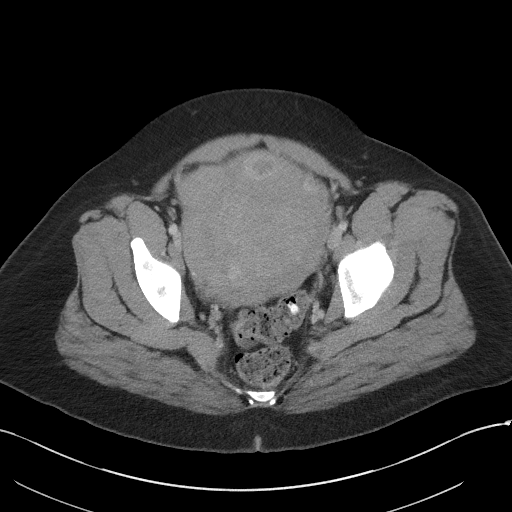
[im 29/97  soft-tissue]
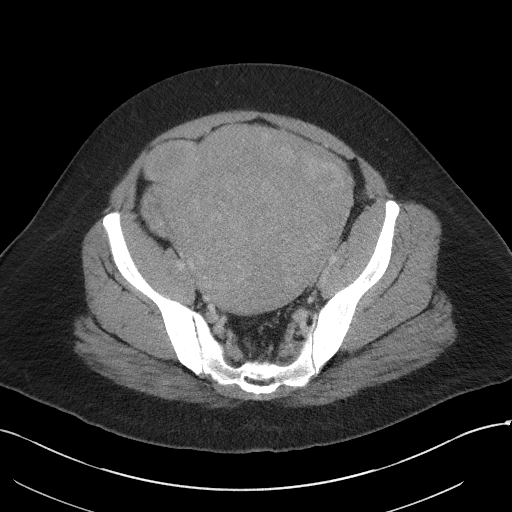
[im 37/97  soft-tissue]
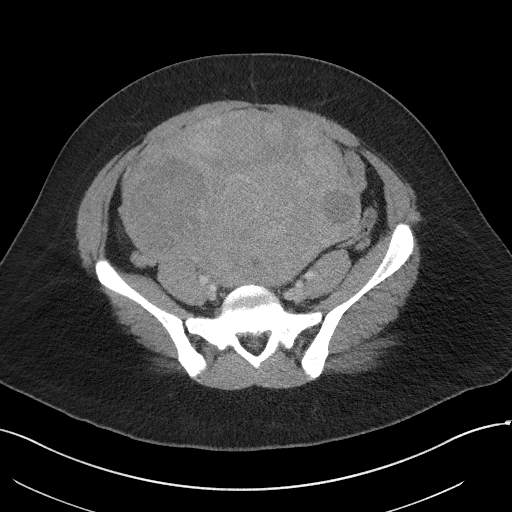
[im 45/97  soft-tissue]
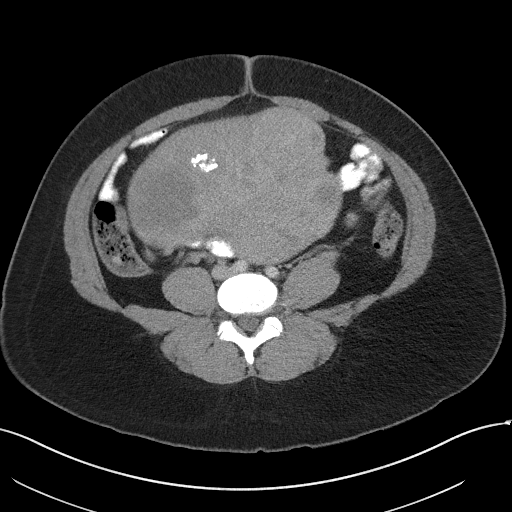
[im 53/97  soft-tissue]
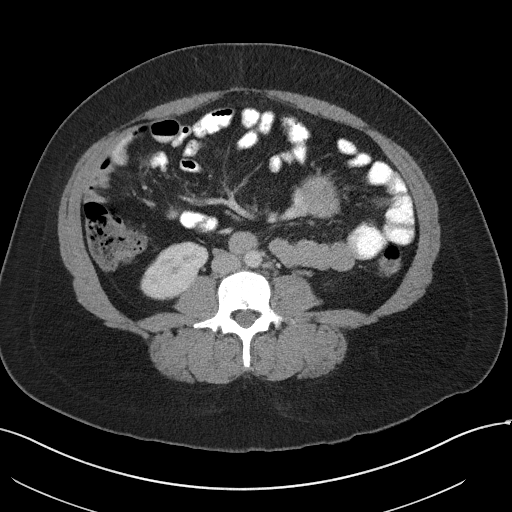
[im 61/97  soft-tissue]
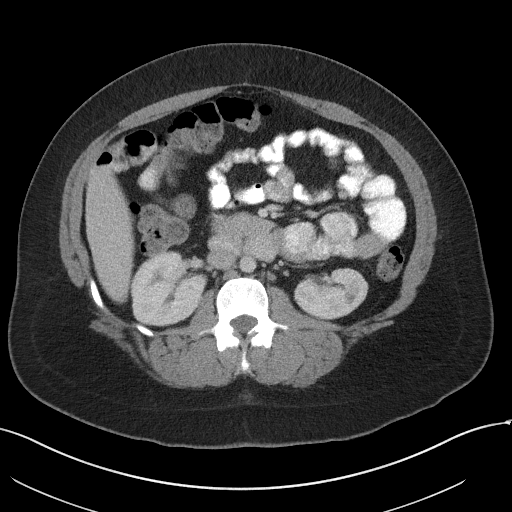
[im 69/97  soft-tissue]
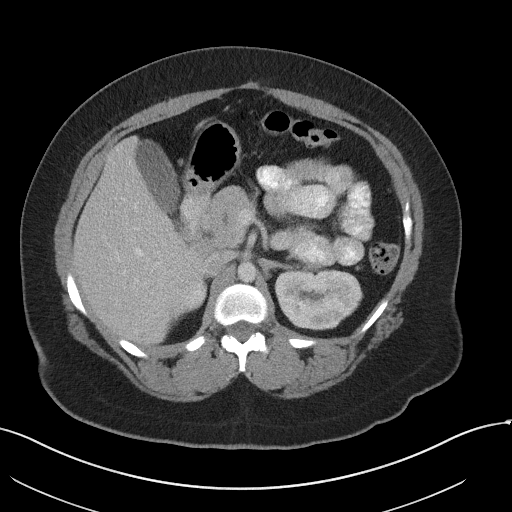
[im 69/97  bone]
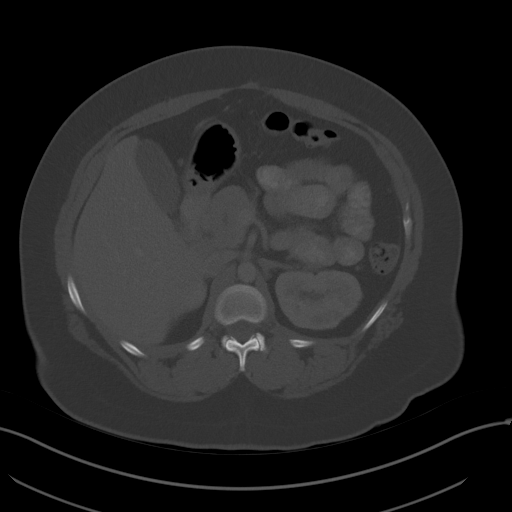
[im 77/97  soft-tissue]
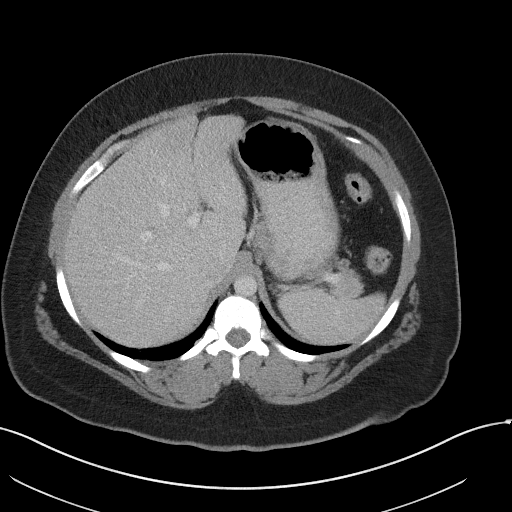
[im 85/97  soft-tissue]
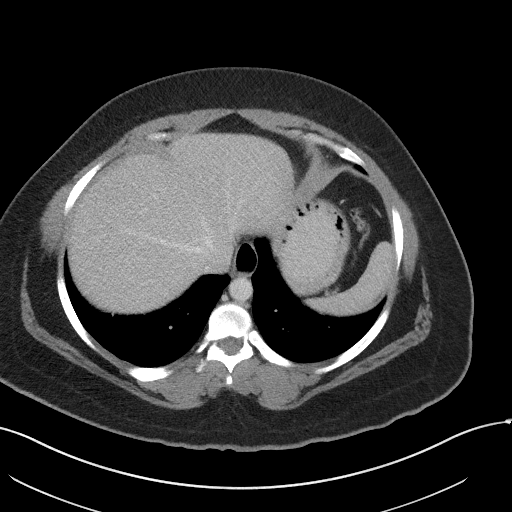
[im 93/97  soft-tissue]
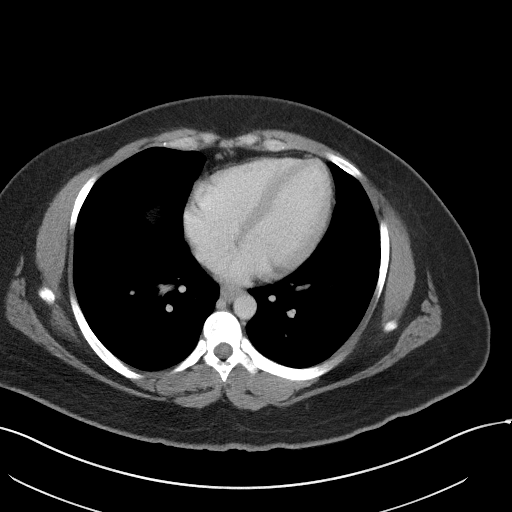

[Series 5: coronal st · coronal · 0.73mm/px · 3 of 104 slices shown]
[im 35/104  soft-tissue]
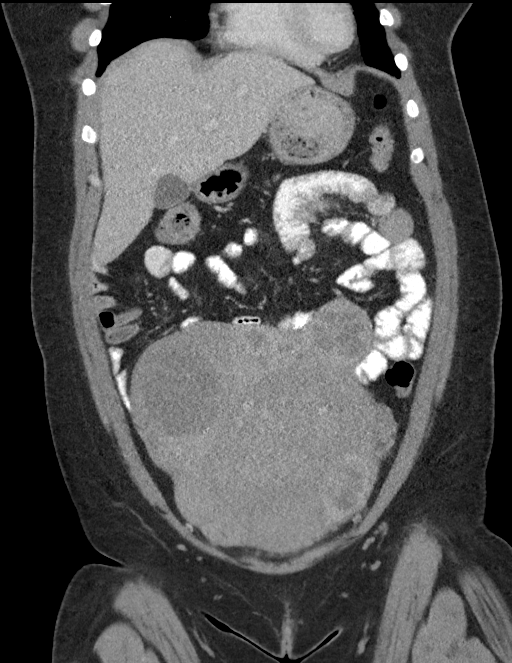
[im 46/104  soft-tissue]
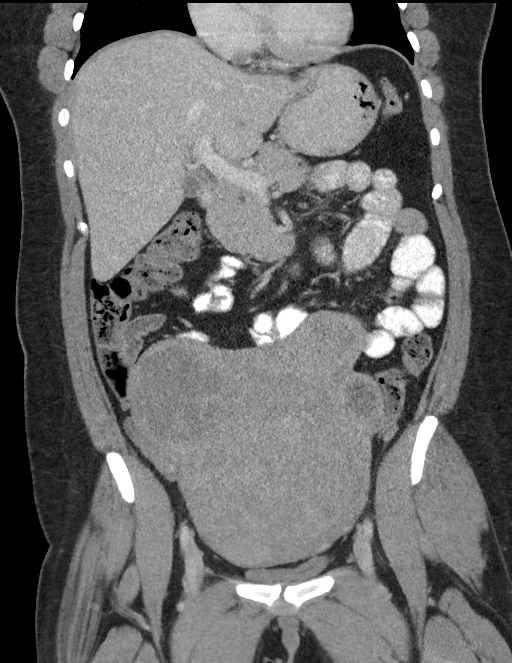
[im 58/104  soft-tissue]
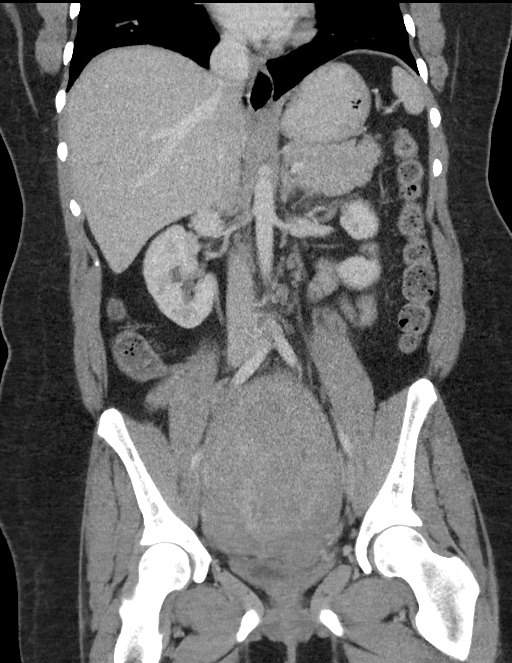

[15 of 46 positions shown; findings below may reference images not displayed]

FINDINGS: Lower chest: The lung bases are clear. Visualized cardiac structures
are within normal limits for size. No pericardial effusion.
Unremarkable visualized distal thoracic esophagus.

Hepatobiliary: Geographic hypoattenuation in the left hemi-liver
adjacent to the fissure for the falciform ligament is nonspecific
but most suggestive of benign focal fatty infiltration. Otherwise,
normal hepatic contour and morphology without discrete lesion.
Gallbladder is unremarkable. No intra or extrahepatic biliary ductal
dilatation.

Pancreas: Unremarkable. No pancreatic ductal dilatation or
surrounding inflammatory changes.

Spleen: Normal in size without focal abnormality.

Adrenals/Urinary Tract: The right adrenal gland is surgically
absent. The left adrenal gland is normal. Symmetric renal
parenchymal enhancement bilaterally. No hydronephrosis or
nephrolithiasis. The bladder is decompressed and not well seen.

Stomach/Bowel: No evidence of obstruction or focal bowel wall
thickening. Normal appendix in the right lower quadrant. The
terminal ileum is unremarkable.

Vascular/Lymphatic: No significant vascular findings are present. No
enlarged abdominal or pelvic lymph nodes.

Reproductive: Marked interval enlargement of the globular and
multinodular fibroid uterus compared to 03/18/2015. The uterus now
measures approximately 19.0 x 18.9 x 13.3 cm compared to 15.2 x
x 13.8 cm. Several of the fibroids demonstrate a change in
enhancement with increased central low-attenuation which may
represent interval infarction.

Other: No abdominal wall hernia or abnormality. No abdominopelvic
ascites.

Musculoskeletal: No acute or significant osseous findings.
IMPRESSION: 1. Marked interval enlargement of multi fibroid uterus compared to
03/18/2015. Additionally, several of the fibroids now demonstrate
central low-attenuation suggesting interval infarction and necrosis.
This is a likely candidate for the source of the patient's lower
abdominal and pelvic pain. Of note, relatively rapid enlargement of
the fibroid uterus raises concern for the rare possibility of
uterine sarcoma. Enlarging benign uterine leiomyoma remain favored.
Recommend non emergent gynecological consultation.
2. Otherwise, no acute abnormality in the abdomen or pelvis.

## 2018-09-25 ENCOUNTER — Other Ambulatory Visit: Payer: Self-pay | Admitting: Internal Medicine

## 2018-10-09 ENCOUNTER — Other Ambulatory Visit: Payer: Self-pay

## 2018-10-09 ENCOUNTER — Ambulatory Visit (INDEPENDENT_AMBULATORY_CARE_PROVIDER_SITE_OTHER): Payer: Managed Care, Other (non HMO)

## 2018-10-09 VITALS — BP 106/70 | HR 72 | Ht 64.0 in | Wt 193.0 lb

## 2018-10-09 DIAGNOSIS — N898 Other specified noninflammatory disorders of vagina: Secondary | ICD-10-CM

## 2018-10-09 DIAGNOSIS — Z202 Contact with and (suspected) exposure to infections with a predominantly sexual mode of transmission: Secondary | ICD-10-CM

## 2018-10-09 DIAGNOSIS — Z113 Encounter for screening for infections with a predominantly sexual mode of transmission: Secondary | ICD-10-CM

## 2018-10-09 DIAGNOSIS — B9689 Other specified bacterial agents as the cause of diseases classified elsewhere: Secondary | ICD-10-CM | POA: Diagnosis not present

## 2018-10-09 DIAGNOSIS — N760 Acute vaginitis: Secondary | ICD-10-CM | POA: Diagnosis not present

## 2018-10-09 NOTE — Progress Notes (Signed)
SUBJECTIVE:  38 y.o. female complains of vag discharge for a couple of days.  Denies abnormal vaginal bleeding or significant pelvic pain or fever. No UTI symptoms. Denies history of known exposure to STD.  Patient's last menstrual period was 03/16/2016.  OBJECTIVE:  She appears well, afebrile. Urine dipstick: N/A  ASSESSMENT:  Vaginal Discharge: none Vaginal Odor :none    PLAN:  GC, chlamydia, trichomonas, BVAG, CVAG, RPR and HIV LAB sent to lab. Treatment: To be determined once lab results are received ROV prn if symptoms persist or worsen.

## 2018-10-10 LAB — HIV ANTIBODY (ROUTINE TESTING W REFLEX): HIV Screen 4th Generation wRfx: NONREACTIVE

## 2018-10-10 LAB — RPR: RPR Ser Ql: NONREACTIVE

## 2018-10-12 ENCOUNTER — Telehealth: Payer: Self-pay

## 2018-10-12 LAB — CERVICOVAGINAL ANCILLARY ONLY
Candida vaginitis: NEGATIVE
Chlamydia: NEGATIVE
Neisseria Gonorrhea: NEGATIVE
Trichomonas: NEGATIVE

## 2018-10-12 NOTE — Telephone Encounter (Signed)
Call patient to give her test results that are negative. We are waiting on BV/CVag to result. Patient voice understanding.

## 2018-10-16 ENCOUNTER — Other Ambulatory Visit: Payer: Self-pay | Admitting: Obstetrics and Gynecology

## 2018-10-16 MED ORDER — METRONIDAZOLE 500 MG PO TABS: 500.0000 mg | ORAL_TABLET | Freq: Two times a day (BID) | ORAL | 0 refills | Status: DC

## 2018-11-02 ENCOUNTER — Encounter: Payer: Self-pay | Admitting: Internal Medicine

## 2018-11-02 ENCOUNTER — Ambulatory Visit: Payer: Managed Care, Other (non HMO) | Admitting: Internal Medicine

## 2018-11-02 ENCOUNTER — Other Ambulatory Visit: Payer: Self-pay

## 2018-11-02 VITALS — BP 108/78 | HR 92 | Temp 98.4°F | Wt 194.0 lb

## 2018-11-02 DIAGNOSIS — R3989 Other symptoms and signs involving the genitourinary system: Secondary | ICD-10-CM | POA: Diagnosis not present

## 2018-11-02 DIAGNOSIS — R310 Gross hematuria: Secondary | ICD-10-CM

## 2018-11-02 MED ORDER — NITROFURANTOIN MONOHYD MACRO 100 MG PO CAPS: 100.0000 mg | ORAL_CAPSULE | Freq: Two times a day (BID) | ORAL | 0 refills | Status: DC

## 2018-11-02 NOTE — Addendum Note (Signed)
Addended by: Lurlean Nanny on: 11/02/2018 04:40 PM   Modules accepted: Orders

## 2018-11-02 NOTE — Progress Notes (Signed)
Patient seen and assessed by nursing staff during this encounter. I have reviewed the chart and agree with the documentation and plan.  Avish Torry, MD 11/02/2018 10:45 AM    

## 2018-11-02 NOTE — Patient Instructions (Signed)
Hematuria, Adult Hematuria is blood in the urine. Blood may be visible in the urine, or it may be identified with a test. This condition can be caused by infections of the bladder, urethra, kidney, or prostate. Other possible causes include:  Kidney stones.  Cancer of the urinary tract.  Too much calcium in the urine.  Conditions that are passed from parent to child (inherited conditions).  Exercise that requires a lot of energy. Infections can usually be treated with medicine, and a kidney stone usually will pass through your urine. If neither of these is the cause of your hematuria, more tests may be needed to identify the cause of your symptoms. It is very important to tell your health care provider about any blood in your urine, even if it is painless or the blood stops without treatment. Blood in the urine, when it happens and then stops and then happens again, can be a symptom of a very serious condition, including cancer. There is no pain in the initial stages of many urinary cancers. Follow these instructions at home: Medicines  Take over-the-counter and prescription medicines only as told by your health care provider.  If you were prescribed an antibiotic medicine, take it as told by your health care provider. Do not stop taking the antibiotic even if you start to feel better. Eating and drinking  Drink enough fluid to keep your urine clear or pale yellow. It is recommended that you drink 3-4 quarts (2.8-3.8 L) a day. If you have been diagnosed with an infection, it is recommended that you drink cranberry juice in addition to large amounts of water.  Avoid caffeine, tea, and carbonated beverages. These tend to irritate the bladder.  Avoid alcohol because it may irritate the prostate (men). General instructions  If you have been diagnosed with a kidney stone, follow your health care provider's instructions about straining your urine to catch the stone.  Empty your bladder  often. Avoid holding urine for long periods of time.  If you are female: ? After a bowel movement, wipe from front to back and use each piece of toilet paper only once. ? Empty your bladder before and after sex.  Pay attention to any changes in your symptoms. Tell your health care provider about any changes or any new symptoms.  It is your responsibility to get your test results. Ask your health care provider, or the department performing the test, when your results will be ready.  Keep all follow-up visits as told by your health care provider. This is important. Contact a health care provider if:  You develop back pain.  You have a fever.  You have nausea or vomiting.  Your symptoms do not improve after 3 days.  Your symptoms get worse. Get help right away if:  You develop severe vomiting and are unable take medicine without vomiting.  You develop severe pain in your back or abdomen even though you are taking medicine.  You pass a large amount of blood in your urine.  You pass blood clots in your urine.  You feel very weak or like you might faint.  You faint. Summary  Hematuria is blood in the urine. It has many possible causes.  It is very important that you tell your health care provider about any blood in your urine, even if it is painless or the blood stops without treatment.  Take over-the-counter and prescription medicines only as told by your health care provider.  Drink enough fluid to keep   your urine clear or pale yellow. This information is not intended to replace advice given to you by your health care provider. Make sure you discuss any questions you have with your health care provider. Document Released: 02/01/2005 Document Revised: 01/14/2017 Document Reviewed: 03/06/2016 Elsevier Patient Education  2020 Elsevier Inc.  

## 2018-11-02 NOTE — Progress Notes (Signed)
HPI  Pt presents to the clinic today with c/o bladder pressure and hematuria. This started 2 days ago. She denies urinary urgency, frequency, dysuria. She denies fever, chills, nausea or low back pain. She is currently being treated for asymptomatic BV with Flagyl. She has not tried anything OTC for her symptoms.   Review of Systems  Past Medical History:  Diagnosis Date  . Anemia    from heavy menses  . Diabetes mellitus   . Fibroadenoma of breast   . Hyperlipidemia   . Hypertension   . Obesity   . OSA (obstructive sleep apnea)    s/p UPPP  . Uterine fibroid     Family History  Problem Relation Age of Onset  . Heart disease Mother   . Hypertension Mother   . Diabetes Mother   . Hypertension Father   . Hyperlipidemia Father   . Pulmonary embolism Father   . Diabetes Maternal Grandmother   . Stroke Maternal Grandmother   . Heart disease Paternal Grandfather   . Hypertension Paternal Grandfather   . Breast cancer Other   . Breast cancer Paternal Grandmother   . Colon cancer Neg Hx     Social History   Socioeconomic History  . Marital status: Single    Spouse name: Not on file  . Number of children: Not on file  . Years of education: Not on file  . Highest education level: Not on file  Occupational History  . Not on file  Social Needs  . Financial resource strain: Not on file  . Food insecurity    Worry: Not on file    Inability: Not on file  . Transportation needs    Medical: Not on file    Non-medical: Not on file  Tobacco Use  . Smoking status: Never Smoker  . Smokeless tobacco: Never Used  Substance and Sexual Activity  . Alcohol use: Yes    Alcohol/week: 0.0 standard drinks    Comment: socially  . Drug use: No  . Sexual activity: Not Currently    Partners: Male    Birth control/protection: None  Lifestyle  . Physical activity    Days per week: Not on file    Minutes per session: Not on file  . Stress: Not on file  Relationships  . Social  Herbalist on phone: Not on file    Gets together: Not on file    Attends religious service: Not on file    Active member of club or organization: Not on file    Attends meetings of clubs or organizations: Not on file    Relationship status: Not on file  . Intimate partner violence    Fear of current or ex partner: Not on file    Emotionally abused: Not on file    Physically abused: Not on file    Forced sexual activity: Not on file  Other Topics Concern  . 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 Amlodipine     Avoid CCBs due to gum disease.   . Calcium Channel Blockers     Avoid CCBs other than diltiazem due to gum disease.   . Nifedipine Swelling    Swollen gums     Constitutional: Denies fever, malaise, fatigue, headache or abrupt weight changes.   GU: Pt bladder pressure and blood in urine.  Denies urgency, frequency, dysuria, burning sensation, odor or discharge. Skin: Denies redness, rashes, lesions or ulcercations.   No other specific complaints in a complete review of systems (except as listed in HPI above).    Objective:   Physical Exam  BP 108/78   Pulse 92   Temp 98.4 F (36.9 C) (Temporal)   Wt 194 lb (88 kg)   LMP 03/16/2016   BMI 33.30 kg/m   Wt Readings from Last 3 Encounters:  10/09/18 193 lb (87.5 kg)  11/29/17 186 lb 8 oz (84.6 kg)  07/14/17 195 lb 12 oz (88.8 kg)    General: Appears her stated age, well developed, well nourished in NAD. Abdomen: Soft. Normal bowel sounds. No distention or masses noted.  Tender to palpation over the bladder area. No CVA tenderness.        Assessment & Plan:   Bladder Pressure, Blood in Urine:  Urinalysis: Trace leuks, trace blood Will send urine culture eRx sent if for Macrobid 100 mg BID x 5 days OK to take AZO OTC Drink plenty of fluids  RTC as needed or if symptoms persist. Webb Silversmith,  NP

## 2018-11-04 LAB — URINE CULTURE
MICRO NUMBER:: 893243
SPECIMEN QUALITY:: ADEQUATE

## 2018-11-07 MED ORDER — FLUCONAZOLE 150 MG PO TABS: 150.0000 mg | ORAL_TABLET | Freq: Once | ORAL | 0 refills | Status: AC

## 2018-11-07 NOTE — Addendum Note (Signed)
Addended by: Jearld Fenton on: 11/07/2018 08:19 AM   Modules accepted: Orders

## 2018-11-10 ENCOUNTER — Telehealth: Payer: Self-pay | Admitting: Family Medicine

## 2018-11-10 MED ORDER — FLUCONAZOLE 150 MG PO TABS: 150.0000 mg | ORAL_TABLET | Freq: Once | ORAL | 0 refills | Status: AC

## 2018-11-10 NOTE — Telephone Encounter (Signed)
Best number [phone] Pt would like a call from [place]  She wanting to see if she could get a refill  [facility]   If called in before 5pm today  Please send to [facility] on [address]

## 2018-11-10 NOTE — Telephone Encounter (Signed)
VO given from [facility] to send in [place]... Rx sent through e-scribe Pt is aware

## 2018-11-20 ENCOUNTER — Telehealth: Payer: Self-pay | Admitting: Family Medicine

## 2018-11-20 ENCOUNTER — Other Ambulatory Visit: Payer: Self-pay | Admitting: Internal Medicine

## 2018-11-20 NOTE — Telephone Encounter (Signed)
Patient is requesting a call back to discuss medication from her visit 9/17 with Webb Silversmith   Patient mentioned the name of the prescription diazepam ??   She did not give any other information as to what exactly she was needing

## 2018-11-22 ENCOUNTER — Ambulatory Visit (INDEPENDENT_AMBULATORY_CARE_PROVIDER_SITE_OTHER): Payer: Managed Care, Other (non HMO) | Admitting: Internal Medicine

## 2018-11-22 ENCOUNTER — Other Ambulatory Visit: Payer: Self-pay

## 2018-11-22 ENCOUNTER — Encounter: Payer: Self-pay | Admitting: Internal Medicine

## 2018-11-22 VITALS — BP 106/76 | HR 92 | Temp 98.7°F | Wt 192.0 lb

## 2018-11-22 DIAGNOSIS — N898 Other specified noninflammatory disorders of vagina: Secondary | ICD-10-CM | POA: Diagnosis not present

## 2018-11-22 NOTE — Patient Instructions (Signed)
Vaginitis  Vaginitis is irritation and swelling (inflammation) of the vagina. It happens when normal bacteria and yeast in the vagina grow too much. There are many types of this condition. Treatment will depend on the type you have. Follow these instructions at home: Lifestyle  Keep your vagina area clean and dry. ? Avoid using soap. ? Rinse the area with water.  Do not do the following until your doctor says it is okay: ? Wash and clean out the vagina (douche). ? Use tampons. ? Have sex.  Wipe from front to back after going to the bathroom.  Let air reach your vagina. ? Wear cotton underwear. ? Do not wear: ? Underwear while you sleep. ? Tight pants. ? Thong underwear. ? Underwear or nylons without a cotton panel. ? Take off any wet clothing, such as bathing suits, as soon as possible.  Use gentle, non-scented products. Do not use things that can irritate the vagina, such as fabric softeners. Avoid the following products if they are scented: ? Feminine sprays. ? Detergents. ? Tampons. ? Feminine hygiene products. ? Soaps or bubble baths.  Practice safe sex and use condoms. General instructions  Take over-the-counter and prescription medicines only as told by your doctor.  If you were prescribed an antibiotic medicine, take or use it as told by your doctor. Do not stop taking or using the antibiotic even if you start to feel better.  Keep all follow-up visits as told by your doctor. This is important. Contact a doctor if:  You have pain in your belly.  You have a fever.  Your symptoms last for more than 2-3 days. Get help right away if:  You have a fever and your symptoms get worse all of a sudden. Summary  Vaginitis is irritation and swelling of the vagina. It can happen when the normal bacteria and yeast in the vagina grow too much. There are many types.  Treatment will depend on the type you have.  Do not douche, use tampons , or have sex until your health  care provider approves. When you can return to sex, practice safe sex and use condoms. This information is not intended to replace advice given to you by your health care provider. Make sure you discuss any questions you have with your health care provider. Document Released: 04/30/2008 Document Revised: 01/14/2017 Document Reviewed: 02/24/2016 Elsevier Patient Education  2020 Elsevier Inc.  

## 2018-11-22 NOTE — Progress Notes (Signed)
Subjective:    Patient ID: Susan Bridges, female    DOB: 1980/11/26, 38 y.o.   MRN: [id number]  HPI  Pt presents to the clinic today with c/o vaginal discharge. She reports the discharge is thick and white. She is not is not have any itching or irritation. She had BV 09/2018. She developed another yeast infection after being treated for a UTI with antibiotics 10/2018. She has not tried anything OTC for this.  Review of Systems      Past Medical History:  Diagnosis Date  . Anemia    from heavy menses  . Diabetes mellitus   . Fibroadenoma of breast   . Hyperlipidemia   . Hypertension   . Obesity   . OSA (obstructive sleep apnea)    s/p UPPP  . Uterine fibroid     Current Outpatient Medications  Medication Sig Dispense Refill  . atorvastatin (LIPITOR) 20 MG tablet TAKE 1 TABLET DAILY. PLEASEMAKE AN APPOINTMENT WITH   YOUR DOCTOR 90 tablet 1  . Continuous Blood Gluc Sensor (FREESTYLE LIBRE 14 DAY SENSOR) MISC 1 Device by Does not apply route every 14 (fourteen) days. 6 each 3  . hydrochlorothiazide (HYDRODIURIL) 25 MG tablet Take 1 tablet (25 mg total) by mouth daily. 90 tablet 1  . labetalol (NORMODYNE) 100 MG tablet TAKE 1 TABLET TWICE A DAY 180 tablet 0  . lisinopril (ZESTRIL) 10 MG tablet TAKE 1 TABLET DAILY 90 tablet 0  . metFORMIN (GLUMETZA) 1000 MG (MOD) 24 hr tablet Take 1 tablet (1,000 mg total) by mouth 2 (two) times daily.    . metroNIDAZOLE (FLAGYL) 500 MG tablet Take 1 tablet (500 mg total) by mouth 2 (two) times daily. 14 tablet 0  . nitrofurantoin, macrocrystal-monohydrate, (MACROBID) 100 MG capsule Take 1 capsule (100 mg total) by mouth 2 (two) times daily. 10 capsule 0  . Semaglutide (OZEMPIC) 0.25 or 0.5 MG/DOSE SOPN Inject into the skin.    Deborah Chalk ER 420 MG 24 hr capsule TAKE 1 CAPSULE DAILY 90 capsule 1   No current facility-administered medications for this visit.     Allergies  Allergen Reactions  . Amlodipine     Avoid CCBs due to gum disease.   .  Calcium Channel Blockers     Avoid CCBs other than diltiazem due to gum disease.   . Nifedipine Swelling    Swollen gums    Family History  Problem Relation Age of Onset  . Heart disease Mother   . Hypertension Mother   . Diabetes Mother   . Hypertension Father   . Hyperlipidemia Father   . Pulmonary embolism Father   . Diabetes Maternal Grandmother   . Stroke Maternal Grandmother   . Heart disease Paternal Grandfather   . Hypertension Paternal Grandfather   . Breast cancer Other   . Breast cancer Paternal Grandmother   . Colon cancer Neg Hx     Social History   Socioeconomic History  . Marital status: Single    Spouse name: Not on file  . Number of children: Not on file  . Years of education: Not on file  . Highest education level: Not on file  Occupational History  . Not on file  Social Needs  . Financial resource strain: Not on file  . Food insecurity    Worry: Not on file    Inability: Not on file  . Transportation needs    Medical: Not on file    Non-medical: Not on file  Tobacco  Use  . Smoking status: Never Smoker  . Smokeless tobacco: Never Used  Substance and Sexual Activity  . Alcohol use: Yes    Alcohol/week: 0.0 standard drinks    Comment: socially  . Drug use: No  . Sexual activity: Not Currently    Partners: Male    Birth control/protection: None  Lifestyle  . Physical activity    Days per week: Not on file    Minutes per session: Not on file  . Stress: Not on file  Relationships  . Social Herbalist on phone: Not on file    Gets together: Not on file    Attends religious service: Not on file    Active member of club or organization: Not on file    Attends meetings of clubs or organizations: Not on file    Relationship status: Not on file  . Intimate partner violence    Fear of current or ex partner: Not on file    Emotionally abused: Not on file    Physically abused: Not on file    Forced sexual activity: Not on file  Other  Topics Concern  . 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  Respiratory: Denies difficulty breathing, shortness of breath, cough or sputum production.   Cardiovascular: Denies chest pain, chest tightness, palpitations or swelling in the hands or feet.  Gastrointestinal: Denies abdominal pain, bloating, constipation, diarrhea or blood in the stool.  GU: Pt reports vaginal discharge. Denies urgency, frequency, pain with urination, burning sensation, blood in urine, odor.  No other specific complaints in a complete review of systems (except as listed in HPI above).   Objective:   Physical Exam   LMP 03/16/2016  Wt Readings from Last 3 Encounters:  11/02/18 194 lb (88 kg)  10/09/18 193 lb (87.5 kg)  11/29/17 186 lb 8 oz (84.6 kg)    General: Appears her stated age, well developed, well nourished in NAD. Cardiovascular: Normal rate and rhythm.  Pulmonary/Chest: Normal effort and positive vesicular breath sounds. No respiratory distress. No wheezes, rales or ronchi noted.  Abdomen: Soft and nontender. Normal bowel sounds. No distention or masses noted.  GU: Self swabbed. Neurological: Alert and oriented.   BMET    Component Value Date/Time   NA 136 11/21/2017 0753   K 3.8 11/21/2017 0753   CL 99 11/21/2017 0753   CO2 28 11/21/2017 0753   GLUCOSE 133 (H) 11/21/2017 0753   BUN 13 11/21/2017 0753   CREATININE 0.95 11/21/2017 0753   CALCIUM 9.5 11/21/2017 0753   GFRNONAA >60 06/26/2016 1018   GFRAA >60 06/26/2016 1018    Lipid Panel     Component Value Date/Time   CHOL 103 11/21/2017 0753   TRIG 76.0 11/21/2017 0753   HDL 39.90 11/21/2017 0753   CHOLHDL 3 11/21/2017 0753   VLDL 15.2 11/21/2017 0753   LDLCALC 48 11/21/2017 0753    CBC    Component Value Date/Time   WBC 10.5 06/26/2016 1018   RBC 4.62 06/26/2016 1018    HGB 13.1 06/26/2016 1018   HCT 40.1 06/26/2016 1018   PLT 408 06/26/2016 1018   MCV 86.7 06/26/2016 1018   MCH 28.4 06/26/2016 1018   MCHC 32.8 06/26/2016 1018   RDW 13.9 06/26/2016 1018   LYMPHSABS 2.5 03/19/2013 0958  MONOABS 0.5 03/19/2013 0958   EOSABS 0.2 03/19/2013 0958   BASOSABS 0.0 03/19/2013 0958    Hgb A1C Lab Results  Component Value Date   HGBA1C 6.3 11/21/2017          Assessment & Plan:   Vaginal Discharge:  Will send off wet prep today and treat accordingly based on results Discussed avoiding loofahs, bubble baths, feminine washes  Return precautions discussed Webb Silversmith

## 2018-11-23 LAB — WET PREP BY MOLECULAR PROBE
Candida species: NOT DETECTED
MICRO NUMBER:: 964489
SPECIMEN QUALITY:: ADEQUATE
Trichomonas vaginosis: NOT DETECTED

## 2018-11-25 ENCOUNTER — Other Ambulatory Visit: Payer: Self-pay | Admitting: Internal Medicine

## 2018-11-27 MED ORDER — METRONIDAZOLE 500 MG PO TABS: 500.0000 mg | ORAL_TABLET | Freq: Three times a day (TID) | ORAL | 0 refills | Status: DC

## 2018-11-27 NOTE — Telephone Encounter (Signed)
This is a [facility] patient

## 2018-11-27 NOTE — Addendum Note (Signed)
Addended by: Jearld Fenton on: 11/27/2018 09:23 AM   Modules accepted: Orders

## 2018-11-28 NOTE — Telephone Encounter (Signed)
Sent. Thanks.   

## 2018-12-01 ENCOUNTER — Telehealth: Payer: Self-pay | Admitting: Family Medicine

## 2018-12-01 ENCOUNTER — Ambulatory Visit: Payer: Managed Care, Other (non HMO) | Admitting: Family Medicine

## 2018-12-01 ENCOUNTER — Other Ambulatory Visit: Payer: Managed Care, Other (non HMO)

## 2018-12-01 ENCOUNTER — Encounter: Payer: Self-pay | Admitting: Family Medicine

## 2018-12-01 ENCOUNTER — Other Ambulatory Visit: Payer: Self-pay

## 2018-12-01 VITALS — BP 110/84 | HR 87 | Temp 97.9°F | Ht 64.0 in | Wt 194.0 lb

## 2018-12-01 DIAGNOSIS — E1129 Type 2 diabetes mellitus with other diabetic kidney complication: Secondary | ICD-10-CM | POA: Diagnosis not present

## 2018-12-01 DIAGNOSIS — Z8249 Family history of ischemic heart disease and other diseases of the circulatory system: Secondary | ICD-10-CM

## 2018-12-01 DIAGNOSIS — Z659 Problem related to unspecified psychosocial circumstances: Secondary | ICD-10-CM

## 2018-12-01 DIAGNOSIS — I10 Essential (primary) hypertension: Secondary | ICD-10-CM

## 2018-12-01 DIAGNOSIS — Z23 Encounter for immunization: Secondary | ICD-10-CM | POA: Diagnosis not present

## 2018-12-01 DIAGNOSIS — R809 Proteinuria, unspecified: Secondary | ICD-10-CM

## 2018-12-01 NOTE — Telephone Encounter (Signed)
Patient called in regards to the letter she requested at her appointment today, She stated that those labs will not work. They are requiring the labs had to be done between July 1st- Oct 23rd.   Patient would like to know if you could order the labs and she come by today to get those done so she can get the results and turn them in.

## 2018-12-01 NOTE — Patient Instructions (Signed)
Flu shot today.  We'll call about the counseling and cardiology appointments.  Take care.  Glad to see you.  Update me as needed.

## 2018-12-01 NOTE — Progress Notes (Addendum)
Hypertension:    Using medication without problems or lightheadedness: Yes Chest pain with exertion: No Edema: No  Short of breath: No She has mult relatives with early CAD.  D/w pt about preemptive cards eval.    DM2 per endo.  I'll defer, d/w pt.  She agrees.    She has noted irritability with job stressors, d/w pt. No SI/HI.  We talked about options, with counseling versus medications.  Still okay for outpatient follow-up.  PMH and SH reviewed  ROS: Per HPI unless specifically indicated in ROS section   EKG without acute change.  Discussed with patient office visit.  Meds, vitals, and allergies reviewed.   GEN: nad, alert and oriented HEENT: ncat NECK: supple w/o LA CV: rrr.  no murmur PULM: ctab, no inc wob ABD: soft, +bs EXT: no edema SKIN: no acute rash  Diabetic foot exam: Normal inspection No skin breakdown No calluses  Normal DP pulses Normal sensation to light touch and monofilament Nails normal

## 2018-12-01 NOTE — Telephone Encounter (Signed)
Ordered. Thanks

## 2018-12-02 LAB — LIPID PANEL
Cholesterol: 105 mg/dL (ref <200)
HDL: 38 mg/dL — ABNORMAL LOW (ref >=50)
LDL Cholesterol (Calc): 51 mg/dL (calc)
Non-HDL Cholesterol (Calc): 67 mg/dL (calc) (ref <130)
Total CHOL/HDL Ratio: 2.8 (calc) (ref <5.0)
Triglycerides: 78 mg/dL (ref <150)

## 2018-12-02 LAB — HEMOGLOBIN A1C
Hgb A1c MFr Bld: 6.5 % of total Hgb — ABNORMAL HIGH (ref <5.7)
Mean Plasma Glucose: 140 (calc)
eAG (mmol/L): 7.7 (calc)

## 2018-12-03 DIAGNOSIS — Z659 Problem related to unspecified psychosocial circumstances: Secondary | ICD-10-CM | POA: Insufficient documentation

## 2018-12-03 DIAGNOSIS — Z8249 Family history of ischemic heart disease and other diseases of the circulatory system: Secondary | ICD-10-CM | POA: Insufficient documentation

## 2018-12-03 NOTE — Assessment & Plan Note (Signed)
DM2 per endo.  I'll defer, d/w pt.  She agrees.

## 2018-12-03 NOTE — Assessment & Plan Note (Signed)
She has mult relatives with early CAD.  D/w pt about preemptive cards eval. continue primary prevention otherwise with current medications and diet and exercise.

## 2018-12-03 NOTE — Assessment & Plan Note (Signed)
She has noted irritability with job stressors, d/w pt. No SI/HI.  We talked about options, with counseling versus medications.  Still okay for outpatient follow-up.  Refer to counseling.  She will update me as needed. >25 minutes spent in face to face time with patient, >50% spent in counselling or coordination of care.

## 2018-12-03 NOTE — Assessment & Plan Note (Signed)
No change in meds at this point.  Continue as is.  She agrees.  Continue work on diet and exercise.

## 2018-12-29 ENCOUNTER — Ambulatory Visit (INDEPENDENT_AMBULATORY_CARE_PROVIDER_SITE_OTHER): Payer: Managed Care, Other (non HMO) | Admitting: Cardiology

## 2018-12-29 ENCOUNTER — Other Ambulatory Visit: Payer: Self-pay | Admitting: *Deleted

## 2018-12-29 ENCOUNTER — Other Ambulatory Visit: Payer: Self-pay

## 2018-12-29 ENCOUNTER — Encounter: Payer: Self-pay | Admitting: Cardiology

## 2018-12-29 ENCOUNTER — Telehealth: Payer: Self-pay | Admitting: Family Medicine

## 2018-12-29 VITALS — BP 106/70 | HR 76 | Ht 64.0 in | Wt 194.4 lb

## 2018-12-29 DIAGNOSIS — I10 Essential (primary) hypertension: Secondary | ICD-10-CM

## 2018-12-29 DIAGNOSIS — Z8241 Family history of sudden cardiac death: Secondary | ICD-10-CM

## 2018-12-29 DIAGNOSIS — E119 Type 2 diabetes mellitus without complications: Secondary | ICD-10-CM

## 2018-12-29 MED ORDER — HYDROCHLOROTHIAZIDE 25 MG PO TABS: 25.0000 mg | ORAL_TABLET | Freq: Every day | ORAL | 0 refills | Status: DC

## 2018-12-29 NOTE — Patient Instructions (Signed)
Medication Instructions:  The current medical regimen is effective;  continue present plan and medications.  *If you need a refill on your cardiac medications before your next appointment, please call your pharmacy*  Testing/Procedures: Your physician has requested that you have an echocardiogram. Echocardiography is a painless test that uses sound waves to create images of your heart. It provides your doctor with information about the size and shape of your heart and how well your heart's chambers and valves are working. This procedure takes approximately one hour. There are no restrictions for this procedure.  Follow-Up: Follow up as needed after the above testing.  Thank you for choosing [facility]!!     

## 2018-12-29 NOTE — Telephone Encounter (Signed)
Patient is requesting a refill. She stated that she is completely out and the mail order pharmacy stated it could take up to 5 days to get the script to her. She would like to have her HYDROCHLOROTHIAZIDE sent to a local pharmacy   [facility] [place]

## 2018-12-29 NOTE — Progress Notes (Signed)
Cardiology Office Note:    Date:  12/29/2018   ID:  Susan Bridges, DOB Apr 02, 1980, MRN [id number]  PCP:  Susan Ghent, MD  Cardiologist:  Susan Furbish, MD  Electrophysiologist:  None   Referring MD: Susan Ghent, MD    History of Present Illness:    Susan Bridges is a 38 y.o. female here for evaluation of possible coronary artery disease with strong family history of CAD, paternal grandfather with quadruple bypass, father, Susan Bridges, patient of mine, mother with cardiomegaly.  Cousin died out of the blue, sudden cardiac death.   HTN, DM, coronary artery disease equivalent.  Non-smoker  9 years in [place]. When moved here was on 10 BP meds. Could not control. 160/100. Renal and Endo and [facility]. Adrenal gland taken out/removed.  Hyperaldosteronism diagnosed.  She has been able to pull back on many of her blood pressure medications.  Excellent.  Denies any fevers chills nausea vomiting syncope bleeding chest pain shortness of breath    Past Medical History:  Diagnosis Date   Anemia    from heavy menses   Diabetes mellitus    Fibroadenoma of breast    Hyperlipidemia    Hypertension    Obesity    OSA (obstructive sleep apnea)    s/p UPPP   Uterine fibroid     Past Surgical History:  Procedure Laterality Date   ABDOMINAL HYSTERECTOMY     ADRENALECTOMY  2013   Right, done at [facility] EXCISIONAL BIOPSY Left 06/15/2008   benign   TONSILLECTOMY AND ADENOIDECTOMY     UVULOPALATOPHARYNGOPLASTY      Current Medications: Current Meds  Medication Sig   atorvastatin (LIPITOR) 20 MG tablet TAKE 1 TABLET DAILY. PLEASEMAKE AN APPOINTMENT WITH   YOUR DOCTOR   Continuous Blood Gluc Sensor (FREESTYLE LIBRE 14 DAY SENSOR) MISC 1 Device by Does not apply route every 14 (fourteen) days.   hydrochlorothiazide (HYDRODIURIL) 25 MG tablet Take 1 tablet (25 mg total) by mouth daily.   labetalol (NORMODYNE) 100 MG tablet TAKE 1 TABLET TWICE  A DAY   lisinopril (ZESTRIL) 10 MG tablet TAKE 1 TABLET DAILY   metFORMIN (GLUMETZA) 1000 MG (MOD) 24 hr tablet Take 1 tablet (1,000 mg total) by mouth 2 (two) times daily.   metroNIDAZOLE (FLAGYL) 500 MG tablet Take 1 tablet (500 mg total) by mouth 3 (three) times daily.   Semaglutide (OZEMPIC) 0.25 or 0.5 MG/DOSE SOPN Inject into the skin.   TIADYLT ER 420 MG 24 hr capsule TAKE 1 CAPSULE DAILY     Allergies:   Amlodipine, Calcium channel blockers, and Nifedipine   Social History   Socioeconomic History   Marital status: Single    Spouse name: Not on file   Number of children: Not on file   Years of education: Not on file   Highest education level: Not on file  Occupational History   Not on file  Social Needs   Financial resource strain: Not on file   Food insecurity    Worry: Not on file    Inability: Not on file   Transportation needs    Medical: Not on file    Non-medical: Not on file  Tobacco Use   Smoking status: Never Smoker   Smokeless tobacco: Never Used  Substance and Sexual Activity   Alcohol use: Yes    Alcohol/week: 0.0 standard drinks    Comment: socially   Drug use: No   Sexual activity:  Not Currently    Partners: Male    Birth control/protection: None  Lifestyle   Physical activity    Days per week: Not on file    Minutes per session: Not on file   Stress: Not on file  Relationships   Social connections    Talks on phone: Not on file    Gets together: Not on file    Attends religious service: Not on file    Active member of club or organization: Not on file    Attends meetings of clubs or organizations: Not on file    Relationship status: Not on file  Other Topics Concern   Not on file  Social History Narrative   From [place]   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 There is no history of Colon cancer.  ROS:   Please see the history of present illness.     All other systems reviewed and are negative.  EKGs/Labs/Other Studies Reviewed:    The following studies were reviewed today: No prior echocardiogram for review  EKG: Sinus rhythm 88 with no other significant abnormalities.  Recent Labs: No results found for requested labs within last 8760 hours.  Recent Lipid Panel    Component Value Date/Time   CHOL 105 12/01/2018 1544   TRIG 78 12/01/2018 1544   HDL 38 (L) 12/01/2018 1544   CHOLHDL 2.8 12/01/2018 1544   VLDL 15.2 11/21/2017 0753   LDLCALC 51 12/01/2018 1544    Physical Exam:    VS:  BP 106/70    Pulse 76    Ht 5\' 4"  (1.626 m)    Wt 194 lb 6.4 oz (88.2 kg)    LMP 03/16/2016    BMI 33.37 kg/m     Wt Readings from Last 3 Encounters:  12/29/18 194 lb 6.4 oz (88.2 kg)  12/01/18 194 lb (88 kg)  11/22/18 192 lb (87.1 kg)     GEN:  Well nourished, well developed in no acute distress HEENT: Normal NECK: No JVD; No carotid bruits LYMPHATICS: No lymphadenopathy CARDIAC: RRR, no murmurs, rubs, gallops RESPIRATORY:  Clear to auscultation without rales, wheezing or rhonchi  ABDOMEN: Soft, non-tender, non-distended MUSCULOSKELETAL:  No edema; No deformity  SKIN: Warm and dry NEUROLOGIC:  Alert and oriented x 3 PSYCHIATRIC:  Normal affect   ASSESSMENT:    1. Family history of sudden cardiac death   2. Essential hypertension, benign   3. Diabetes mellitus with coincident hypertension (HCC)    PLAN:    In order of problems listed above:  Strong family history of sudden cardiac death and coronary artery disease as well as cardiomegaly -With her diabetes, coronary artery disease equivalent, and  above historical findings, I think it makes sense for [place] to check an echocardiogram to ensure proper structure and function of the heart and to exclude the possibility of hypertrophic cardiomyopathy.  Cardiovascular prevention -Excellent job with blood pressure control currently following adrenalectomy.  Excellent diagnosis of hyperaldosteronism.  She previously was on spironolactone but this did not even control her blood pressures.  Essential hypertension -Medications reviewed.  Continue.  Stable.  As she continues to lose  weight, and if her blood pressure remains excellent, she could slowly begin to remove some of her medications.  Diabetes with hypertension -Per Dr. Damita Dunnings and endocrinology.  Excellent.   Medication Adjustments/Labs and Tests Ordered: Current medicines are reviewed at length with the patient today.  Concerns regarding medicines are outlined above.  Orders Placed This Encounter  Procedures   ECHOCARDIOGRAM COMPLETE   No orders of the defined types were placed in this encounter.   Patient Instructions  Medication Instructions:  The current medical regimen is effective;  continue present plan and medications.  *If you need a refill on your cardiac medications before your next appointment, please call your pharmacy*  Testing/Procedures: Your physician has requested that you have an echocardiogram. Echocardiography is a painless test that uses sound waves to create images of your heart. It provides your doctor with information about the size and shape of your heart and how well your hearts chambers and valves are working. This procedure takes approximately one hour. There are no restrictions for this procedure.  Follow-Up: Follow up as needed after the above testing.  Thank you for choosing [facility]!!         Signed, Susan Furbish, MD  12/29/2018 5:26 PM    [place]

## 2019-01-01 ENCOUNTER — Other Ambulatory Visit: Payer: Self-pay

## 2019-01-01 NOTE — Telephone Encounter (Signed)
Patient was seen by Rollene Fare on 12/06/18 for a yeast infection. Patient states she completed the course of Flagyl, and her sx resolved. But she used a different feminine soap, and she believe she has another yeast infection. Patient c/o white discharge and vaginal itch.  Patient would like another dose of flagyl to be called in. Is this ok, or does she need to be seen?

## 2019-01-02 MED ORDER — FLUCONAZOLE 150 MG PO TABS: 150.0000 mg | ORAL_TABLET | Freq: Once | ORAL | 0 refills | Status: DC

## 2019-01-02 NOTE — Telephone Encounter (Signed)
Patient advised.

## 2019-01-02 NOTE — Telephone Encounter (Signed)
Patient says she believes it might be yeast.  Rollene Fare spoke with her about her body wash and what was best to use in the vaginal area.  She has not used anything over the counter yet.  Patient did soak in a baking soda bath last night and it helped some.   Patient also asked if she could use a Probiotic for Women's Health since she seems to be so prone to these infections.

## 2019-01-02 NOTE — Telephone Encounter (Signed)
Please check with patient.  See if the symptoms are similar to previous.  If they are, we can retreat with Flagyl.  Her last wet prep showed BV. However she is having white discharge and itching is more likely to be a yeast infection and she would need medication other than Flagyl.   As she used anything over-the-counter?  Thanks.

## 2019-01-02 NOTE — Telephone Encounter (Signed)
I would try diflucan and update [place] if not better.  Thanks.

## 2019-01-04 ENCOUNTER — Ambulatory Visit: Payer: Managed Care, Other (non HMO) | Admitting: Family Medicine

## 2019-01-04 ENCOUNTER — Other Ambulatory Visit: Payer: Self-pay

## 2019-01-04 ENCOUNTER — Encounter: Payer: Self-pay | Admitting: Family Medicine

## 2019-01-04 DIAGNOSIS — N760 Acute vaginitis: Secondary | ICD-10-CM | POA: Diagnosis not present

## 2019-01-04 MED ORDER — FLUCONAZOLE 150 MG PO TABS: 150.0000 mg | ORAL_TABLET | Freq: Once | ORAL | 0 refills | Status: AC

## 2019-01-04 MED ORDER — METRONIDAZOLE 500 MG PO TABS: 500.0000 mg | ORAL_TABLET | Freq: Two times a day (BID) | ORAL | 0 refills | Status: DC

## 2019-01-04 NOTE — Patient Instructions (Signed)
Restart metronidazole twice a day.  If you still have discharge after that, then take 1 more dose of diflucan.  Take care.  Glad to see you.  Update me as needed.

## 2019-01-04 NOTE — Progress Notes (Signed)
This visit occurred during the SARS-CoV-2 public health emergency.  Safety protocols were in place, including screening questions prior to the visit, additional usage of staff PPE, and extensive cleaning of exam room while observing appropriate contact time as indicated for disinfecting solutions.    Vaginitis.  Still with some discharge.  Diflucan helped some but she still has some irritation.  Discharge is milky but not like prev cottage cheese.  No FCNAVD.  History of diabetes noted. A1c 6.5 on last check.    She was recently treated with diflucan.  She was treated with metronidazole a few weeks ago.  She clearly improved with metronidazole prev.   Meds, vitals, and allergies reviewed.   ROS: Per HPI unless specifically indicated in ROS section   nad ncat rrr A few yeast and a few clue cells noted on WP.

## 2019-01-07 DIAGNOSIS — N760 Acute vaginitis: Secondary | ICD-10-CM | POA: Insufficient documentation

## 2019-01-07 NOTE — Assessment & Plan Note (Signed)
Discussed options. Restart metronidazole twice a day.  If still having discharge after that, then take 1 more dose of diflucan.  Update me as needed.  She agrees to plan.

## 2019-01-15 ENCOUNTER — Other Ambulatory Visit: Payer: Self-pay

## 2019-01-15 ENCOUNTER — Ambulatory Visit (HOSPITAL_COMMUNITY): Payer: Managed Care, Other (non HMO) | Attending: Cardiology

## 2019-01-15 DIAGNOSIS — Z8241 Family history of sudden cardiac death: Secondary | ICD-10-CM | POA: Diagnosis present

## 2019-01-15 DIAGNOSIS — E119 Type 2 diabetes mellitus without complications: Secondary | ICD-10-CM

## 2019-01-15 DIAGNOSIS — I10 Essential (primary) hypertension: Secondary | ICD-10-CM

## 2019-01-20 ENCOUNTER — Other Ambulatory Visit: Payer: Self-pay | Admitting: Family Medicine

## 2019-02-08 IMAGING — US US ART/VEN ABD/PELV/SCROTUM DOPPLER LTD
1 series · 13 of 25 positions shown · non-contrast
Comparison: 04/12/2016

CLINICAL DATA: Right-sided pelvic pain and known history of uterine
enlargement and multiple uterine fibroids.

EXAM:
TRANSABDOMINAL ULTRASOUND OF PELVIS
DOPPLER ULTRASOUND OF OVARIES
TECHNIQUE: Transabdominal ultrasound examination of the pelvis was performed
including evaluation of the uterus, ovaries, adnexal regions, and
pelvic cul-de-sac.
Color and duplex Doppler ultrasound was utilized to evaluate blood
flow to the ovaries.

[Series 1: us art/ven abd/pelv/scrotum doppler ltd · 0.28mm/px · 13 of 91 slices shown]
[im 1/91]
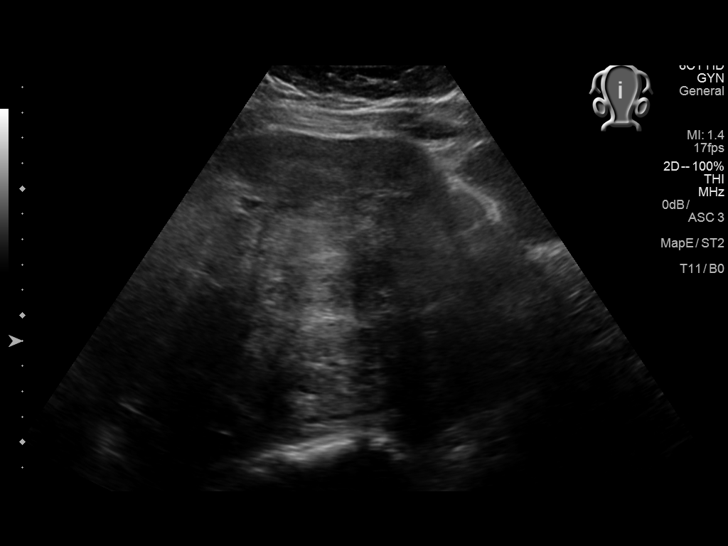
[im 8/91]
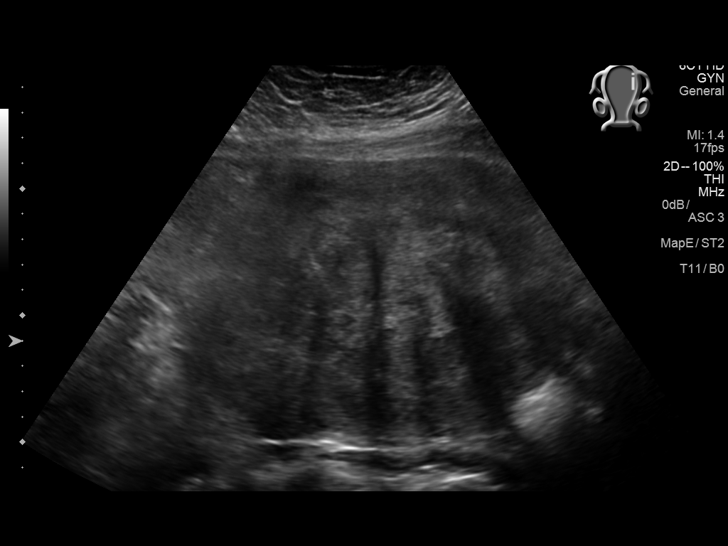
[im 16/91]
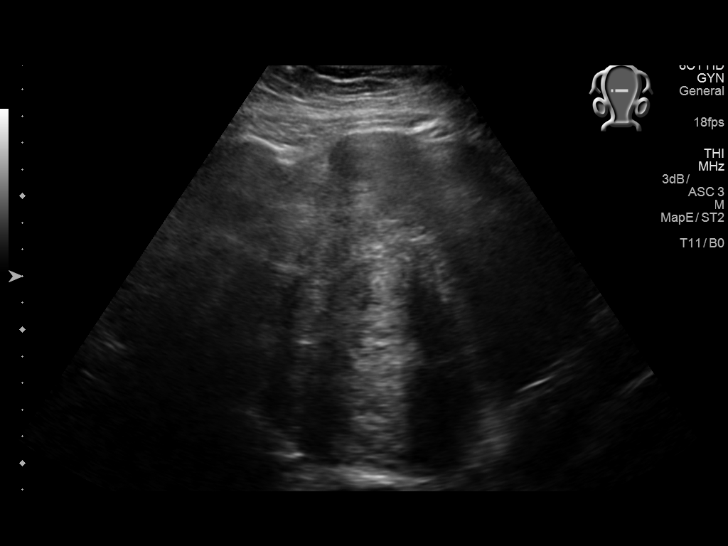
[im 23/91]
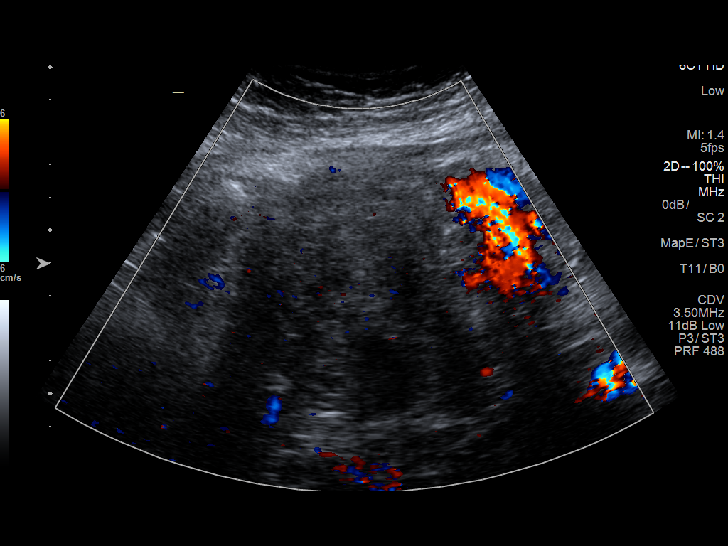
[im 31/91]
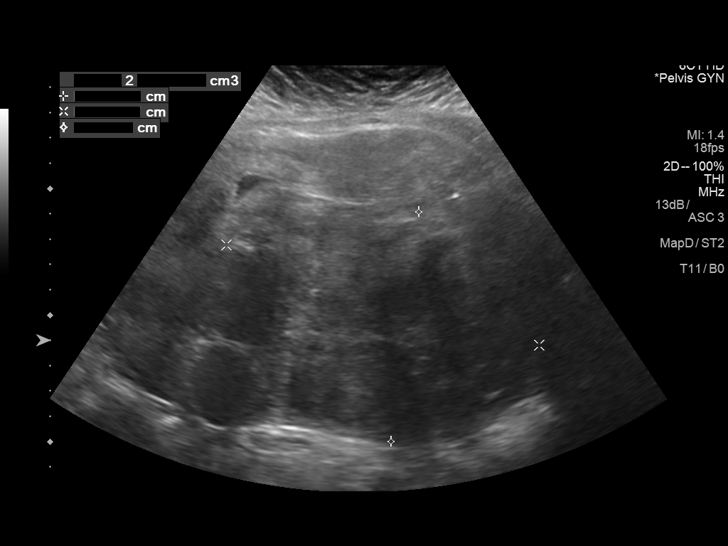
[im 38/91]
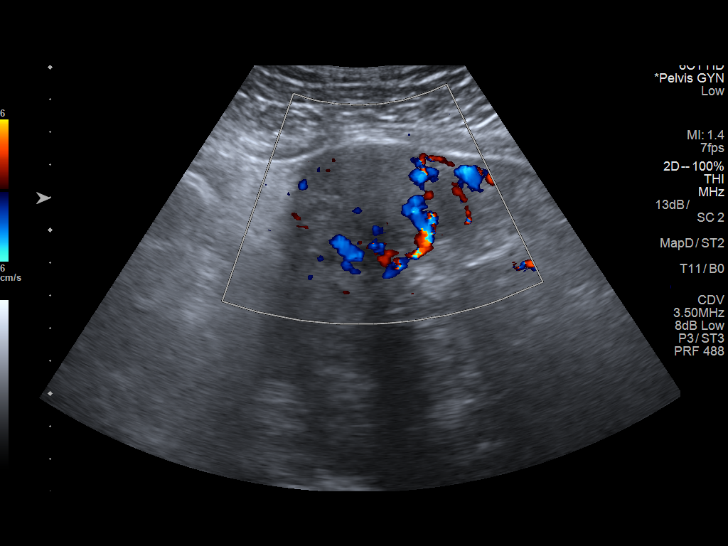
[im 46/91]
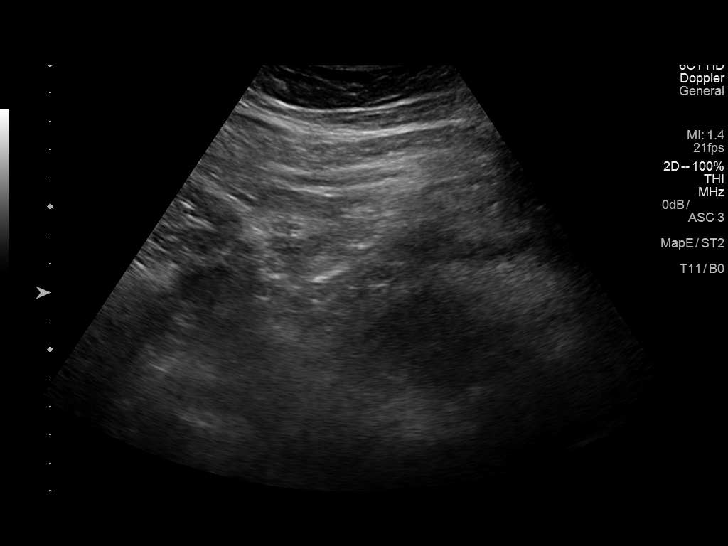
[im 53/91]
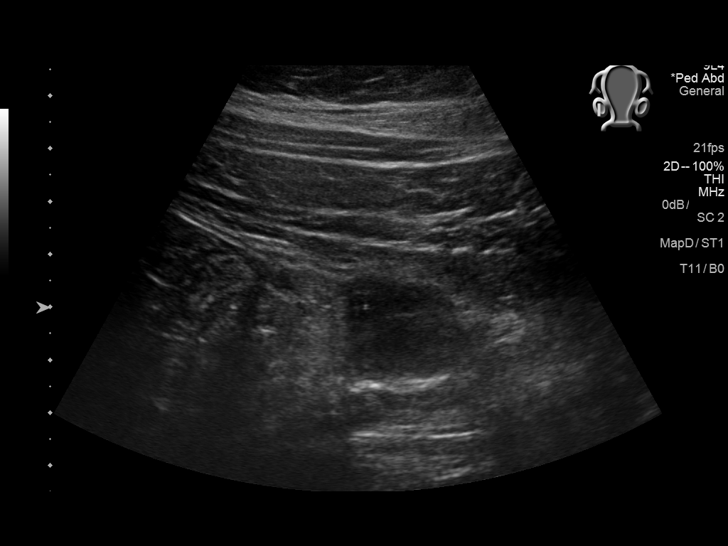
[im 61/91]
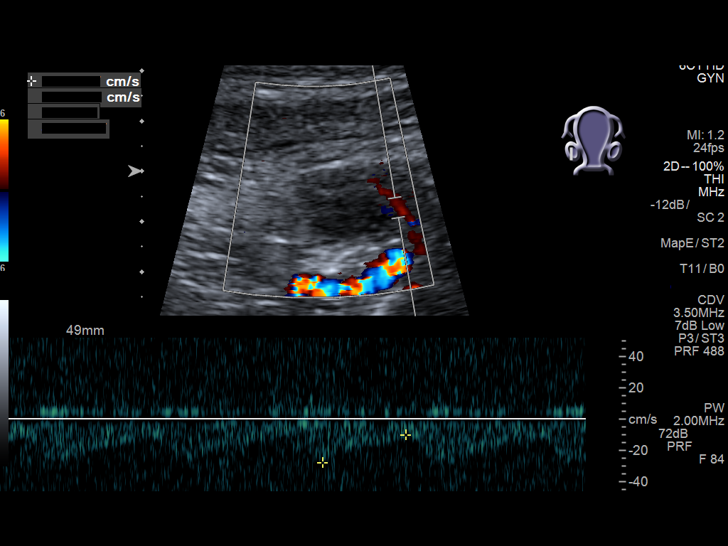
[im 68/91]
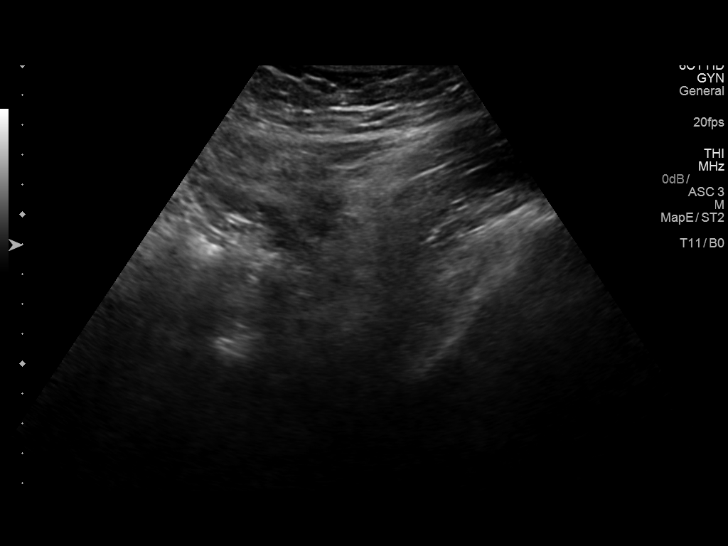
[im 76/91]
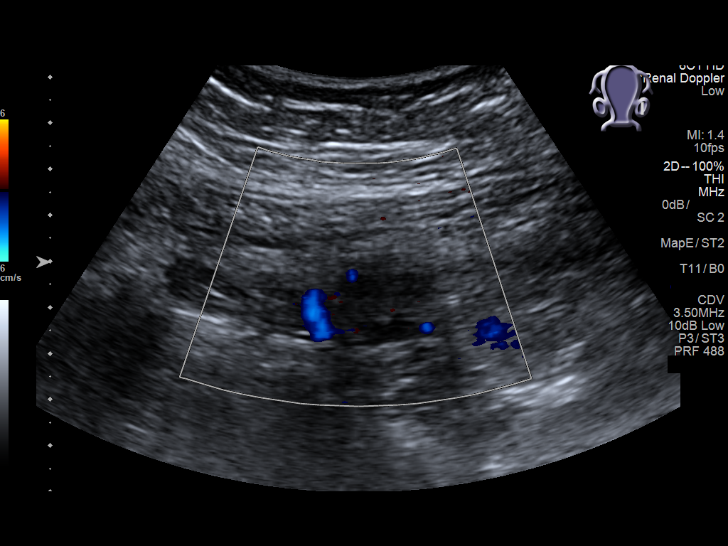
[im 83/91]
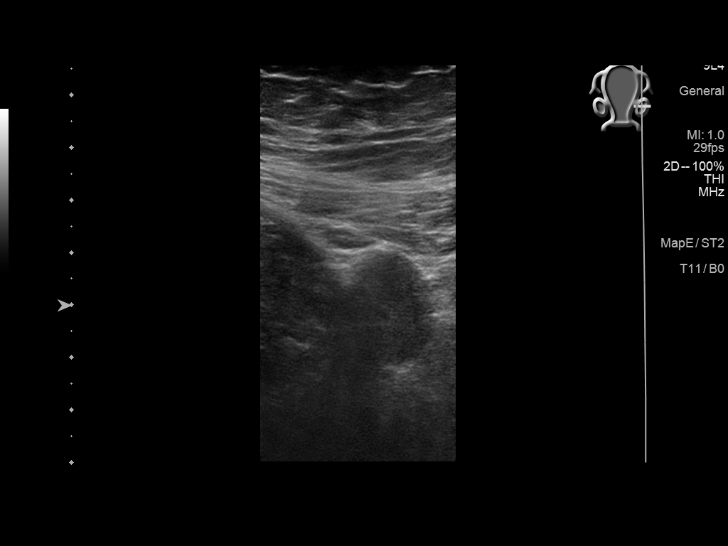
[im 91/91]
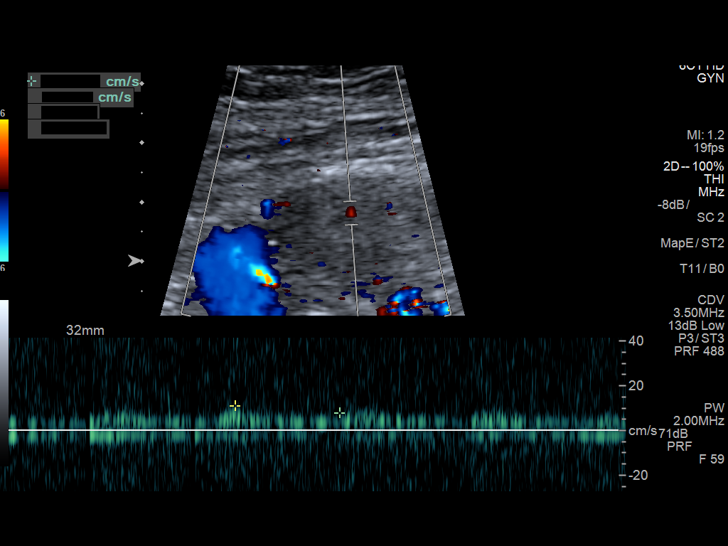

[13 of 25 positions shown; findings below may reference images not displayed]

FINDINGS: Uterus

Measurements: 20 x 13.4 x 15.6 cm. Multiple ill-defined fibroids are
again seen throughout the uterus. The largest is on the order of 13
cm in the posterior uterus and previously measured approximately 10
cm. Exact margination of fibroids is difficult to determine,
however, by transabdominal sonography due to ill-defined borders.

Endometrium

The endometrium is distorted by fibroids with a small amount of
fluid identified in a portion of the endometrial cavity. Endometrial
thickness estimated at 9 mm.

Right ovary

Measurements: 2.6 x 1.6 x 2.0 70. Normal appearance/no adnexal mass.

Left ovary

Measurements: 2.5 x 1.8 x 2.0 cm. Normal appearance/no adnexal mass.

Pulsed Doppler evaluation demonstrates normal low-resistance
arterial and venous waveforms in both ovaries.

No free fluid identified in the pelvis.
IMPRESSION: Overall uterine size appears increased compared to the prior pelvic
ultrasound in [REDACTED] due to increasing size of uterine fibroids.
Dominant posterior fibroid now measures approximately 13 cm in
greatest estimated diameter. The ovaries are normal bilaterally.

## 2019-02-12 ENCOUNTER — Ambulatory Visit: Payer: Managed Care, Other (non HMO) | Attending: Internal Medicine

## 2019-02-12 DIAGNOSIS — Z20822 Contact with and (suspected) exposure to covid-19: Secondary | ICD-10-CM

## 2019-02-14 LAB — NOVEL CORONAVIRUS, NAA: SARS-CoV-2, NAA: NOT DETECTED

## 2019-02-18 ENCOUNTER — Other Ambulatory Visit: Payer: Self-pay | Admitting: Family Medicine

## 2019-03-06 ENCOUNTER — Other Ambulatory Visit: Payer: Self-pay | Admitting: Family Medicine

## 2019-03-07 ENCOUNTER — Other Ambulatory Visit: Payer: Self-pay

## 2019-03-07 MED ORDER — ATORVASTATIN CALCIUM 20 MG PO TABS: ORAL_TABLET | ORAL | 2 refills | Status: DC

## 2019-03-07 NOTE — Telephone Encounter (Signed)
Sent. Thanks.   

## 2019-03-30 LAB — HM DIABETES EYE EXAM

## 2019-04-11 ENCOUNTER — Encounter: Payer: Self-pay | Admitting: Optometrist

## 2019-07-20 ENCOUNTER — Other Ambulatory Visit: Payer: Self-pay | Admitting: Family Medicine

## 2019-07-20 DIAGNOSIS — Z1231 Encounter for screening mammogram for malignant neoplasm of breast: Secondary | ICD-10-CM

## 2019-07-25 ENCOUNTER — Other Ambulatory Visit: Payer: Self-pay | Admitting: Family Medicine

## 2019-08-14 ENCOUNTER — Other Ambulatory Visit: Payer: Self-pay

## 2019-08-14 ENCOUNTER — Ambulatory Visit
Admission: RE | Admit: 2019-08-14 | Discharge: 2019-08-14 | Disposition: A | Discharge status: Home / Self Care | Payer: Managed Care, Other (non HMO) | Source: Ambulatory Visit | Attending: Family Medicine | Admitting: Family Medicine

## 2019-08-14 DIAGNOSIS — Z1231 Encounter for screening mammogram for malignant neoplasm of breast: Secondary | ICD-10-CM

## 2019-08-17 ENCOUNTER — Other Ambulatory Visit: Payer: Self-pay | Admitting: Family Medicine

## 2019-08-17 DIAGNOSIS — R928 Other abnormal and inconclusive findings on diagnostic imaging of breast: Secondary | ICD-10-CM

## 2019-08-22 ENCOUNTER — Ambulatory Visit
Admission: RE | Admit: 2019-08-22 | Discharge: 2019-08-22 | Disposition: A | Discharge status: Home / Self Care | Payer: Managed Care, Other (non HMO) | Source: Ambulatory Visit | Attending: Family Medicine | Admitting: Family Medicine

## 2019-08-22 ENCOUNTER — Other Ambulatory Visit: Payer: Self-pay

## 2019-08-22 ENCOUNTER — Other Ambulatory Visit: Payer: Self-pay | Admitting: Family Medicine

## 2019-08-22 DIAGNOSIS — R928 Other abnormal and inconclusive findings on diagnostic imaging of breast: Secondary | ICD-10-CM

## 2019-08-28 ENCOUNTER — Other Ambulatory Visit: Payer: Self-pay

## 2019-08-28 ENCOUNTER — Ambulatory Visit
Admission: RE | Admit: 2019-08-28 | Discharge: 2019-08-28 | Disposition: A | Discharge status: Home / Self Care | Payer: Managed Care, Other (non HMO) | Source: Ambulatory Visit | Attending: Family Medicine | Admitting: Family Medicine

## 2019-08-28 DIAGNOSIS — R928 Other abnormal and inconclusive findings on diagnostic imaging of breast: Secondary | ICD-10-CM

## 2019-08-30 ENCOUNTER — Other Ambulatory Visit: Payer: Self-pay | Admitting: Family Medicine

## 2019-10-24 ENCOUNTER — Telehealth: Payer: Self-pay | Admitting: Family Medicine

## 2019-10-24 DIAGNOSIS — E1129 Type 2 diabetes mellitus with other diabetic kidney complication: Secondary | ICD-10-CM

## 2019-10-24 NOTE — Telephone Encounter (Signed)
She had labs done at endo recently (7/21).  All she needs is TSh and cbc.  Doesn't need to fast.  I put in the orders.  Thanks.

## 2019-10-24 NOTE — Telephone Encounter (Signed)
Pt schedule follow up 9/13 and wanted to have labs done firday 9/10.  It is ok to schedule labs no orders in system.

## 2019-10-26 ENCOUNTER — Other Ambulatory Visit: Payer: Self-pay

## 2019-10-26 ENCOUNTER — Telehealth: Payer: Self-pay

## 2019-10-26 ENCOUNTER — Other Ambulatory Visit (INDEPENDENT_AMBULATORY_CARE_PROVIDER_SITE_OTHER): Payer: Managed Care, Other (non HMO)

## 2019-10-26 DIAGNOSIS — R809 Proteinuria, unspecified: Secondary | ICD-10-CM

## 2019-10-26 DIAGNOSIS — E1129 Type 2 diabetes mellitus with other diabetic kidney complication: Secondary | ICD-10-CM | POA: Diagnosis not present

## 2019-10-26 LAB — CBC WITH DIFFERENTIAL/PLATELET
Basophils Absolute: 0 10*3/uL (ref 0.0–0.1)
Basophils Relative: 0.1 % (ref 0.0–3.0)
Eosinophils Absolute: 0.1 10*3/uL (ref 0.0–0.7)
Eosinophils Relative: 1.8 % (ref 0.0–5.0)
HCT: 40.5 % (ref 36.0–46.0)
Hemoglobin: 13.6 g/dL (ref 12.0–15.0)
Lymphocytes Relative: 45.2 % (ref 12.0–46.0)
Lymphs Abs: 2.5 10*3/uL (ref 0.7–4.0)
MCHC: 33.6 g/dL (ref 30.0–36.0)
MCV: 93 fl (ref 78.0–100.0)
Monocytes Absolute: 0.5 10*3/uL (ref 0.1–1.0)
Monocytes Relative: 8.5 % (ref 3.0–12.0)
Neutro Abs: 2.5 10*3/uL (ref 1.4–7.7)
Neutrophils Relative %: 44.4 % (ref 43.0–77.0)
Platelets: 373 10*3/uL (ref 150.0–400.0)
RBC: 4.35 Mil/uL (ref 3.87–5.11)
RDW: 12.5 % (ref 11.5–15.5)
WBC: 5.6 10*3/uL (ref 4.0–10.5)

## 2019-10-26 LAB — TSH: TSH: 1.24 u[IU]/mL (ref 0.35–4.50)

## 2019-10-26 NOTE — Telephone Encounter (Signed)
[facility] - Client TELEPHONE ADVICE RECORD Access[facility]urse Patient Name: FRAYA UEDA Gender: Female DOB: June 02, 1980 Age: 39 Y 11 M 28 D Return Phone Number: [phone] (Primary) Address: City/State/Zip: [place] [address] Client [facility] - Client Client Site [place] - Day Physician Renford Dills - MD Contact Type Call Who Is Calling Patient / Member / Family / Caregiver Call Type Triage / Clinical Relationship To Patient Self Return Phone Number [phone] (Primary) Chief Complaint Bruise Reason for Call Symptomatic / Request for Health Information Initial Comment Caller states blood drawn, now having problems and pain at the site. Translation No Disp. Time Eilene Ghazi Time) Disposition Final User 10/26/2019 2:09:08 PM Attempt made - message left Vallery Sa, RN, Tye [place] 10/26/2019 2:23:59 PM FINAL ATTEMPT MADE - message left Yes [place], RN, Tye [place]

## 2019-10-26 NOTE — Telephone Encounter (Signed)
I spoke with pt and she said the reason she did not answer access nurse call was pt came to [facility] and saw lab personnel and a nurse and nothing further needed. Sending note to [facility].

## 2019-10-29 ENCOUNTER — Other Ambulatory Visit: Payer: Self-pay

## 2019-10-29 ENCOUNTER — Ambulatory Visit: Payer: Managed Care, Other (non HMO) | Admitting: Family Medicine

## 2019-10-29 ENCOUNTER — Encounter: Payer: Self-pay | Admitting: Family Medicine

## 2019-10-29 VITALS — BP 110/80 | HR 96 | Temp 97.1°F | Ht 64.0 in | Wt 187.2 lb

## 2019-10-29 DIAGNOSIS — Z23 Encounter for immunization: Secondary | ICD-10-CM | POA: Diagnosis not present

## 2019-10-29 DIAGNOSIS — Z659 Problem related to unspecified psychosocial circumstances: Secondary | ICD-10-CM

## 2019-10-29 DIAGNOSIS — I10 Essential (primary) hypertension: Secondary | ICD-10-CM | POA: Diagnosis not present

## 2019-10-29 DIAGNOSIS — R809 Proteinuria, unspecified: Secondary | ICD-10-CM | POA: Diagnosis not present

## 2019-10-29 DIAGNOSIS — E1129 Type 2 diabetes mellitus with other diabetic kidney complication: Secondary | ICD-10-CM

## 2019-10-29 MED ORDER — ATORVASTATIN CALCIUM 20 MG PO TABS: ORAL_TABLET | ORAL | 3 refills | Status: DC

## 2019-10-29 MED ORDER — LABETALOL HCL 100 MG PO TABS: ORAL_TABLET | ORAL | 3 refills | Status: DC

## 2019-10-29 MED ORDER — LISINOPRIL 10 MG PO TABS: 10.0000 mg | ORAL_TABLET | Freq: Every day | ORAL | 3 refills | Status: DC

## 2019-10-29 MED ORDER — DILTIAZEM HCL ER BEADS 420 MG PO CP24: 420.0000 mg | ORAL_CAPSULE | Freq: Every day | ORAL | 3 refills | Status: DC

## 2019-10-29 MED ORDER — HYDROCHLOROTHIAZIDE 25 MG PO TABS: 25.0000 mg | ORAL_TABLET | Freq: Every day | ORAL | 3 refills | Status: DC

## 2019-10-29 NOTE — Telephone Encounter (Signed)
On 10/26/19 at approximately 1030, patient came back to clinic to have her arm looked at following an earlier am lab draw at our office.  She states that immediately following the lab stick the area became tender and a little puffy.  She was concerned because she has never had this happen before and wanted a nurse or someone just to check it out.  Assessment:  Right forearm, median cephalic vein  Skin was cool to the touch No redness or streaking Small (less than dime size area) soft mound under skin and a little "puffy". No bruising at present but may develop There is some tenderness but no significant pain.   Small hematoma formed under the skin.  She says she did leave pressure dressing in place for extended period following blood draw.   No sign of active infection.  Lab tech does remember having to reposition needle in the vein and may have caused some tissue irritation and a little blood leakage from vein puncture area into surrounding tissues. Patient reassured that this is an occasional, normal side effect of the act of drawing blood and is not anything to be concerned with unless she develops progressing symptoms with pain and/or signs of infection which were reviewed with patient.   Informed patient to apply ice and compression to area today and monitor.  Reviewed signs to look for in case infection develops (as none noted at the present) and if this occurs then to contact us back or the on call physician over the weekend.  She has appt with Dr. Damita Dunnings on Monday (10/29/19) and she can have him check it at that time if needed.   No further concerns and patient thanks [place] for the attention.   FYI to PCP.

## 2019-10-29 NOTE — Patient Instructions (Signed)
I'll check on the counseling referral.  Flu shot today.  Don't change your meds for now.   Take care.  Glad to see you. Thanks for your effort.

## 2019-10-29 NOTE — Progress Notes (Signed)
This visit occurred during the SARS-CoV-2 public health emergency.  Safety protocols were in place, including screening questions prior to the visit, additional usage of staff PPE, and extensive cleaning of exam room while observing appropriate contact time as indicated for disinfecting solutions.  Diabetes per endo.  Prev labs d/w pt.  Recent labs d/w pt.   covid shot prev done.   Flu shot today.    Hypertension:    Using medication without problems or lightheadedness: yes Chest pain with exertion:no Edema:no Short of breath:no Recent labs d/w pt.    We talked about work stressors.  We talked about counseling, she wanted to get referral again.  No SI/HI.     Meds, vitals, and allergies reviewed.  ROS: Per HPI unless specifically indicated in ROS section   GEN: nad, alert and oriented HEENT: ncat NECK: supple w/o LA CV: rrr. PULM: ctab, no inc wob ABD: soft, +bs EXT: no edema SKIN: no acute rash  Diabetic foot exam: Normal inspection No skin breakdown No calluses  Normal DP pulses Normal sensation to light touch and monofilament Nails normal

## 2019-10-30 NOTE — Telephone Encounter (Signed)
Noted.  Discussed with patient at office visit.  Her arm had healed up in the meantime without any residual issue.

## 2019-10-31 NOTE — Assessment & Plan Note (Signed)
Continue work on diet and exercise.  Continue Lipitor, diltiazem, hydrochlorothiazide, labetalol, lisinopril.  Recent labs discussed with patient.  She agrees with plan.

## 2019-10-31 NOTE — Assessment & Plan Note (Signed)
We talked about work stressors.  We talked about counseling, she wanted to get referral again.  No SI/HI.

## 2019-10-31 NOTE — Assessment & Plan Note (Signed)
Per endocrinology.  Previous labs discussed with patient.  No change in meds at this point.

## 2020-03-25 ENCOUNTER — Telehealth: Payer: Self-pay | Admitting: Family Medicine

## 2020-03-25 ENCOUNTER — Other Ambulatory Visit: Payer: Self-pay

## 2020-03-25 MED ORDER — LISINOPRIL 10 MG PO TABS: 10.0000 mg | ORAL_TABLET | Freq: Every day | ORAL | 0 refills | Status: DC

## 2020-03-25 NOTE — Telephone Encounter (Signed)
error 

## 2020-03-25 NOTE — Progress Notes (Signed)
Patient needed one week supply of lisinopril to last until mail order arrived

## 2020-03-25 NOTE — Telephone Encounter (Signed)
[facility] to [facility] and left message for patient making her aware rx was sent

## 2020-03-25 NOTE — Telephone Encounter (Signed)
Patient is currently waiting on a mail order prescription and has not had it in a week or so. She is asking that we send an emergency prescription in for her till her other one gets to her. She is having headaches. She is just wanting a week supply Prescription : Lisinopril  Pharmacy: [facility]

## 2020-05-02 LAB — HM DIABETES EYE EXAM

## 2020-10-09 ENCOUNTER — Ambulatory Visit: Payer: Managed Care, Other (non HMO) | Admitting: Family Medicine

## 2020-10-10 ENCOUNTER — Other Ambulatory Visit: Payer: Self-pay | Admitting: Family Medicine

## 2020-10-23 ENCOUNTER — Encounter: Payer: Self-pay | Admitting: Family Medicine

## 2020-10-23 ENCOUNTER — Other Ambulatory Visit: Payer: Self-pay

## 2020-10-23 ENCOUNTER — Ambulatory Visit: Payer: Managed Care, Other (non HMO) | Admitting: Family Medicine

## 2020-10-23 VITALS — BP 110/78 | HR 96 | Temp 97.0°F | Ht 64.0 in | Wt 188.0 lb

## 2020-10-23 DIAGNOSIS — Z7189 Other specified counseling: Secondary | ICD-10-CM

## 2020-10-23 DIAGNOSIS — E785 Hyperlipidemia, unspecified: Secondary | ICD-10-CM

## 2020-10-23 DIAGNOSIS — Z23 Encounter for immunization: Secondary | ICD-10-CM

## 2020-10-23 DIAGNOSIS — R809 Proteinuria, unspecified: Secondary | ICD-10-CM | POA: Diagnosis not present

## 2020-10-23 DIAGNOSIS — Z0000 Encounter for general adult medical examination without abnormal findings: Secondary | ICD-10-CM | POA: Diagnosis not present

## 2020-10-23 DIAGNOSIS — I10 Essential (primary) hypertension: Secondary | ICD-10-CM

## 2020-10-23 DIAGNOSIS — E1129 Type 2 diabetes mellitus with other diabetic kidney complication: Secondary | ICD-10-CM | POA: Diagnosis not present

## 2020-10-23 LAB — COMPREHENSIVE METABOLIC PANEL
ALT: 10 U/L (ref 0–35)
AST: 11 U/L (ref 0–37)
Albumin: 4.5 g/dL (ref 3.5–5.2)
Alkaline Phosphatase: 37 U/L — ABNORMAL LOW (ref 39–117)
BUN: 9 mg/dL (ref 6–23)
CO2: 29 mEq/L (ref 19–32)
Calcium: 9.8 mg/dL (ref 8.4–10.5)
Chloride: 102 mEq/L (ref 96–112)
Creatinine, Ser: 0.98 mg/dL (ref 0.40–1.20)
GFR: 71.45 mL/min (ref >60.00)
Glucose, Bld: 115 mg/dL — ABNORMAL HIGH (ref 70–99)
Potassium: 4.4 mEq/L (ref 3.5–5.1)
Sodium: 137 mEq/L (ref 135–145)
Total Bilirubin: 0.5 mg/dL (ref 0.2–1.2)
Total Protein: 7.1 g/dL (ref 6.0–8.3)

## 2020-10-23 LAB — LIPID PANEL
Cholesterol: 121 mg/dL (ref 0–200)
HDL: 47.9 mg/dL (ref >39.00)
LDL Cholesterol: 60 mg/dL (ref 0–99)
NonHDL: 72.66
Total CHOL/HDL Ratio: 3
Triglycerides: 61 mg/dL (ref 0.0–149.0)
VLDL: 12.2 mg/dL (ref 0.0–40.0)

## 2020-10-23 LAB — CBC WITH DIFFERENTIAL/PLATELET
Basophils Absolute: 0 10*3/uL (ref 0.0–0.1)
Basophils Relative: 0.2 % (ref 0.0–3.0)
Eosinophils Absolute: 0.1 10*3/uL (ref 0.0–0.7)
Eosinophils Relative: 1.4 % (ref 0.0–5.0)
HCT: 42.4 % (ref 36.0–46.0)
Hemoglobin: 13.9 g/dL (ref 12.0–15.0)
Lymphocytes Relative: 39.2 % (ref 12.0–46.0)
Lymphs Abs: 2.8 10*3/uL (ref 0.7–4.0)
MCHC: 32.7 g/dL (ref 30.0–36.0)
MCV: 94.4 fl (ref 78.0–100.0)
Monocytes Absolute: 0.5 10*3/uL (ref 0.1–1.0)
Monocytes Relative: 7.2 % (ref 3.0–12.0)
Neutro Abs: 3.7 10*3/uL (ref 1.4–7.7)
Neutrophils Relative %: 52 % (ref 43.0–77.0)
Platelets: 372 10*3/uL (ref 150.0–400.0)
RBC: 4.49 Mil/uL (ref 3.87–5.11)
RDW: 12.8 % (ref 11.5–15.5)
WBC: 7.2 10*3/uL (ref 4.0–10.5)

## 2020-10-23 LAB — MICROALBUMIN / CREATININE URINE RATIO
Creatinine,U: 111.5 mg/dL
Microalb Creat Ratio: 0.6 mg/g (ref 0.0–30.0)
Microalb, Ur: <0.7 mg/dL (ref 0.0–1.9)

## 2020-10-23 LAB — HEMOGLOBIN A1C: Hgb A1c MFr Bld: 6.3 % (ref 4.6–6.5)

## 2020-10-23 LAB — TSH: TSH: 1.72 u[IU]/mL (ref 0.35–5.50)

## 2020-10-23 MED ORDER — DILTIAZEM HCL ER BEADS 420 MG PO CP24: 420.0000 mg | ORAL_CAPSULE | Freq: Every day | ORAL | 3 refills | Status: DC

## 2020-10-23 MED ORDER — LABETALOL HCL 100 MG PO TABS: ORAL_TABLET | ORAL | 3 refills | Status: DC

## 2020-10-23 MED ORDER — LISINOPRIL 10 MG PO TABS: 10.0000 mg | ORAL_TABLET | Freq: Every day | ORAL | 3 refills | Status: DC

## 2020-10-23 MED ORDER — ATORVASTATIN CALCIUM 20 MG PO TABS: ORAL_TABLET | ORAL | 3 refills | Status: DC

## 2020-10-23 MED ORDER — HYDROCHLOROTHIAZIDE 25 MG PO TABS: 25.0000 mg | ORAL_TABLET | Freq: Every day | ORAL | 3 refills | Status: DC

## 2020-10-23 NOTE — Progress Notes (Signed)
This visit occurred during the SARS-CoV-2 public health emergency.  Safety protocols were in place, including screening questions prior to the visit, additional usage of staff PPE, and extensive cleaning of exam room while observing appropriate contact time as indicated for disinfecting solutions.  CPE- See plan.  Routine anticipatory guidance given to patient.  See health maintenance.  The possibility exists that previously documented standard health maintenance information may have been brought forward from a previous encounter into this note.  If needed, that same information has been updated to reflect the current situation based on today's encounter.    Tetanus 2014 Flu 2022 PNA 2016 Shingles not due Covid prev done Pap not due.   DXA not due Mammogram due, she'll call to schedule.   Colonoscopy not due Living will d/w pt.  Would have father and stepfather designated equally if she were incapacitated.   Diet and exercise d/w pt.    Hypertension:    Using medication without problems or lightheadedness: yes Chest pain with exertion:no Edema:no Short of breath:no Labs d/w pt.    Elevated Cholesterol: Using medications without problems: yes Muscle aches: not consistent aches.   Diet compliance: d/w pt. Exercise: d/w pt.   Diabetes:  Using medications without difficulties: yes, metformin and ozempic.   Hypoglycemic episodes: no Hyperglycemic episodes:no Feet problems:no Blood Sugars averaging: usually ~110s eye exam within last year: yes  She has some finger numbness that she thought was positional with sleep, d/w pt about bracing, i.e. using a wrist brace at night and see if that helps.  PMH and SH reviewed  Meds, vitals, and allergies reviewed.   ROS: Per HPI.  Unless specifically indicated otherwise in HPI, the patient denies:  General: fever. Eyes: acute vision changes ENT: sore throat Cardiovascular: chest pain Respiratory: SOB GI: vomiting GU:  dysuria Musculoskeletal: acute back pain Derm: acute rash Neuro: acute motor dysfunction Psych: worsening mood Endocrine: polydipsia Heme: bleeding Allergy: hayfever  GEN: nad, alert and oriented HEENT: ncat NECK: supple w/o LA CV: rrr. PULM: ctab, no inc wob ABD: soft, +bs EXT: no edema SKIN: no acute rash Normal sensation on the hands B.    Diabetic foot exam: Normal inspection No skin breakdown No calluses  Normal DP pulses Normal sensation to light touch and monofilament Nails normal

## 2020-10-23 NOTE — Patient Instructions (Signed)
Go to the lab on the way out.   If you have mychart we'll likely use that to update you.    Flu shot today.  Take care.  Glad to see you. Thank you for your effort.  I'll await your endocrine notes.

## 2020-10-26 NOTE — Assessment & Plan Note (Signed)
Controlled.  Continue diltiazem hydrochlorothiazide labetalol.  Continue work on diet and exercise.  I thanked her for her effort.

## 2020-10-26 NOTE — Assessment & Plan Note (Signed)
  Tetanus 2014 Flu 2022 PNA 2016 Shingles not due Covid prev done Pap not due.   DXA not due Mammogram due, she'll call to schedule.   Colonoscopy not due Living will d/w pt.  Would have father and stepfather designated equally if she were incapacitated.   Diet and exercise d/w pt.

## 2020-10-26 NOTE — Assessment & Plan Note (Signed)
See notes on labs.  Continue metformin and Ozempic.  Okay for outpatient follow-up.  Continue work on diet and exercise.

## 2020-10-26 NOTE — Assessment & Plan Note (Signed)
Living will d/w pt.  Would have father and stepfather designated equally if she were incapacitated.   

## 2020-10-26 NOTE — Assessment & Plan Note (Signed)
Continue atorvastatin.  Continue work on diet and exercise.  See notes on labs.

## 2020-11-15 ENCOUNTER — Other Ambulatory Visit: Payer: Self-pay | Admitting: Family Medicine

## 2021-05-06 ENCOUNTER — Other Ambulatory Visit: Payer: Self-pay | Admitting: Family Medicine

## 2021-05-06 DIAGNOSIS — Z1231 Encounter for screening mammogram for malignant neoplasm of breast: Secondary | ICD-10-CM

## 2021-05-13 ENCOUNTER — Ambulatory Visit
Admission: RE | Admit: 2021-05-13 | Discharge: 2021-05-13 | Disposition: A | Discharge status: Home / Self Care | Payer: Managed Care, Other (non HMO) | Source: Ambulatory Visit | Attending: Family Medicine | Admitting: Family Medicine

## 2021-05-13 ENCOUNTER — Ambulatory Visit: Payer: Managed Care, Other (non HMO)

## 2021-05-13 DIAGNOSIS — Z1231 Encounter for screening mammogram for malignant neoplasm of breast: Secondary | ICD-10-CM

## 2021-10-22 ENCOUNTER — Ambulatory Visit: Payer: Managed Care, Other (non HMO) | Admitting: Family Medicine

## 2021-10-30 DIAGNOSIS — E119 Type 2 diabetes mellitus without complications: Secondary | ICD-10-CM | POA: Diagnosis not present

## 2021-11-02 ENCOUNTER — Ambulatory Visit: Payer: Managed Care, Other (non HMO) | Admitting: Family Medicine

## 2021-11-02 DIAGNOSIS — E119 Type 2 diabetes mellitus without complications: Secondary | ICD-10-CM | POA: Diagnosis not present

## 2021-11-02 DIAGNOSIS — I10 Essential (primary) hypertension: Secondary | ICD-10-CM | POA: Diagnosis not present

## 2021-11-06 ENCOUNTER — Encounter: Payer: Self-pay | Admitting: Family Medicine

## 2021-11-06 ENCOUNTER — Ambulatory Visit (INDEPENDENT_AMBULATORY_CARE_PROVIDER_SITE_OTHER): Payer: BC Managed Care – PPO | Admitting: Family Medicine

## 2021-11-06 VITALS — BP 106/80 | HR 72 | Temp 97.7°F | Ht 64.0 in | Wt 190.0 lb

## 2021-11-06 DIAGNOSIS — Z0000 Encounter for general adult medical examination without abnormal findings: Secondary | ICD-10-CM | POA: Diagnosis not present

## 2021-11-06 DIAGNOSIS — Z23 Encounter for immunization: Secondary | ICD-10-CM | POA: Diagnosis not present

## 2021-11-06 DIAGNOSIS — Z7189 Other specified counseling: Secondary | ICD-10-CM

## 2021-11-06 DIAGNOSIS — E1129 Type 2 diabetes mellitus with other diabetic kidney complication: Secondary | ICD-10-CM

## 2021-11-06 DIAGNOSIS — I10 Essential (primary) hypertension: Secondary | ICD-10-CM

## 2021-11-06 MED ORDER — HYDROCHLOROTHIAZIDE 25 MG PO TABS: 25.0000 mg | ORAL_TABLET | Freq: Every day | ORAL | 3 refills | Status: DC

## 2021-11-06 MED ORDER — LABETALOL HCL 100 MG PO TABS: 100.0000 mg | ORAL_TABLET | Freq: Two times a day (BID) | ORAL | 3 refills | Status: DC

## 2021-11-06 MED ORDER — LISINOPRIL 10 MG PO TABS: 10.0000 mg | ORAL_TABLET | Freq: Every day | ORAL | 3 refills | Status: DC

## 2021-11-06 MED ORDER — ATORVASTATIN CALCIUM 20 MG PO TABS: 20.0000 mg | ORAL_TABLET | Freq: Every day | ORAL | 3 refills | Status: DC

## 2021-11-06 MED ORDER — DILTIAZEM HCL ER BEADS 420 MG PO CP24: 420.0000 mg | ORAL_CAPSULE | Freq: Every day | ORAL | 3 refills | Status: DC

## 2021-11-06 MED ORDER — SEMAGLUTIDE (1 MG/DOSE) 4 MG/3ML ~~LOC~~ SOPN: 1.0000 mg | PEN_INJECTOR | SUBCUTANEOUS | Status: DC

## 2021-11-06 NOTE — Progress Notes (Unsigned)
CPE- See plan.  Routine anticipatory guidance given to patient.  See health maintenance.  The possibility exists that previously documented standard health maintenance information may have been brought forward from a previous encounter into this note.  If needed, that same information has been updated to reflect the current situation based on today's encounter.    Tetanus 2014 Flu 2023 PNA 2016 Shingles not due Covid prev done Pap not due.   DXA not due Mammogram 2023 Colonoscopy not due Living will d/w pt.  Would have father and stepfather designated equally if she were incapacitated.   Diet and exercise d/w pt.    Hypertension:    Using medication without problems or lightheadedness: yes Chest pain with exertion: no Edema:no Short of breath:no Diet and exercise d/w pt.    DM2.  Per endo.  A1c 6.5.  I'll defer.  She agrees.  On 1 mg semaglutide wti plan to inc to 2 mg.  Eye exam 10/30/21.  No retinopathy per patient report.    PMH and SH reviewed  Meds, vitals, and allergies reviewed.   ROS: Per HPI.  Unless specifically indicated otherwise in HPI, the patient denies:  General: fever. Eyes: acute vision changes ENT: sore throat Cardiovascular: chest pain Respiratory: SOB GI: vomiting GU: dysuria Musculoskeletal: acute back pain Derm: acute rash Neuro: acute motor dysfunction Psych: worsening mood Endocrine: polydipsia Heme: bleeding Allergy: hayfever  GEN: nad, alert and oriented HEENT: ncat NECK: supple w/o LA CV: rrr. PULM: ctab, no inc wob ABD: soft, +bs EXT: no edema SKIN: Well-perfused.

## 2021-11-06 NOTE — Patient Instructions (Addendum)
If you have lower BP or additional weight loss, the cut the labetalol in half (take a half tab twice a day) and update me.   Update me as needed.   Take care.  Glad to see you. Thanks for your effort.  Recheck in 1 year at a routine visit.

## 2021-11-09 NOTE — Assessment & Plan Note (Signed)
No change in medications at this point.  Continue work on diet and exercise.  Current Outpatient Medications on File Prior to Visit  Medication Sig Dispense Refill  . Continuous Blood Gluc Sensor (FREESTYLE LIBRE 14 DAY SENSOR) MISC 1 Device by Does not apply route every 14 (fourteen) days. 6 each 3  . metFORMIN (GLUMETZA) 1000 MG (MOD) 24 hr tablet Take 1 tablet (1,000 mg total) by mouth 2 (two) times daily.     No current facility-administered medications on file prior to visit.

## 2021-11-09 NOTE — Assessment & Plan Note (Signed)
Per endo.  A1c 6.5.  I'll defer.  She agrees.  On 1 mg semaglutide wti plan to inc to 2 mg.  Eye exam 10/30/21.  No retinopathy per patient report.  I will defer.  She agrees.

## 2021-11-09 NOTE — Assessment & Plan Note (Signed)
Tetanus 2014 Flu 2023 PNA 2016 Shingles not due Covid prev done Pap not due.   DXA not due Mammogram 2023 Colonoscopy not due Living will d/w pt.  Would have father and stepfather designated equally if she were incapacitated.   Diet and exercise d/w pt.

## 2021-11-09 NOTE — Assessment & Plan Note (Signed)
Living will d/w pt.  Would have father and stepfather designated equally if she were incapacitated.

## 2021-12-29 ENCOUNTER — Encounter: Payer: Self-pay | Admitting: Family Medicine

## 2022-05-03 DIAGNOSIS — E119 Type 2 diabetes mellitus without complications: Secondary | ICD-10-CM | POA: Diagnosis not present

## 2022-05-03 DIAGNOSIS — I10 Essential (primary) hypertension: Secondary | ICD-10-CM | POA: Diagnosis not present

## 2022-05-03 LAB — MICROALBUMIN / CREATININE URINE RATIO: Microalb Creat Ratio: 3.7

## 2022-05-03 LAB — PROTEIN / CREATININE RATIO, URINE
Albumin, U: 13
Creatinine, Urine: 353.2

## 2022-07-26 ENCOUNTER — Telehealth: Payer: BC Managed Care – PPO | Admitting: Nurse Practitioner

## 2022-07-26 DIAGNOSIS — R8290 Unspecified abnormal findings in urine: Secondary | ICD-10-CM

## 2022-07-27 NOTE — Progress Notes (Signed)
Savanha,  Because your symptoms are not completely align with a UTI, I feel your condition warrants further evaluation and I recommend that you be seen for a face to face visit for urine testing.  Please contact your primary care physician practice to be seen. Many offices offer virtual options to be seen via video if you are not comfortable going in person to a medical facility at this time.  NOTE: You will NOT be charged for this eVisit.  If you do not have a PCP, [place] offers a free physician referral service available at [phone]. Our trained staff has the experience, knowledge and resources to put you in touch with a physician who is right for you.    If you are having a true medical emergency please call 911.   Your e-visit answers were reviewed by a board certified advanced clinical practitioner to complete your personal care plan.  Thank you for using e-Visits.

## 2022-08-30 ENCOUNTER — Telehealth: Payer: Self-pay

## 2022-08-30 NOTE — Telephone Encounter (Signed)
Unable to reach pt by phone and sending note to [facility] pool and lsc triage.will speak with Shanda Bumps CMA also.

## 2022-08-30 NOTE — Telephone Encounter (Signed)
Unable to reach pt by phone to get current update; I spoke with Shanda Bumps CMA and sending note to Shanda Bumps CMA  since I will be out of office the rest of this week

## 2022-08-30 NOTE — Telephone Encounter (Signed)
Unable to reach pt and left v/m for pt to call [facility] with update. Sending note to Dr Para March, [facility] pool and lsc triage.

## 2022-08-30 NOTE — Telephone Encounter (Signed)
Please try to get update on patient.  Thanks.  

## 2022-09-01 NOTE — Telephone Encounter (Signed)
 LMTCB

## 2022-10-08 DIAGNOSIS — M25511 Pain in right shoulder: Secondary | ICD-10-CM | POA: Diagnosis not present

## 2022-10-19 ENCOUNTER — Telehealth: Payer: Self-pay

## 2022-10-19 MED ORDER — LISINOPRIL 10 MG PO TABS: 10.0000 mg | ORAL_TABLET | Freq: Every day | ORAL | 0 refills | Status: DC

## 2022-10-19 MED ORDER — HYDROCHLOROTHIAZIDE 25 MG PO TABS: 25.0000 mg | ORAL_TABLET | Freq: Every day | ORAL | 0 refills | Status: DC

## 2022-10-19 MED ORDER — ATORVASTATIN CALCIUM 20 MG PO TABS: 20.0000 mg | ORAL_TABLET | Freq: Every day | ORAL | 0 refills | Status: DC

## 2022-10-19 NOTE — Telephone Encounter (Signed)
Patient needs CPE this month; please call patient to set up appt.

## 2022-10-20 NOTE — Telephone Encounter (Signed)
Patient scheduled.

## 2022-10-31 ENCOUNTER — Other Ambulatory Visit: Payer: Self-pay | Admitting: Family Medicine

## 2022-10-31 DIAGNOSIS — E1129 Type 2 diabetes mellitus with other diabetic kidney complication: Secondary | ICD-10-CM

## 2022-11-01 ENCOUNTER — Encounter: Payer: Self-pay | Admitting: Family Medicine

## 2022-11-01 ENCOUNTER — Other Ambulatory Visit: Payer: BC Managed Care – PPO

## 2022-11-01 ENCOUNTER — Telehealth: Payer: Self-pay

## 2022-11-01 NOTE — Telephone Encounter (Signed)
Abstracted from care everywhere as requested.

## 2022-11-01 NOTE — Telephone Encounter (Signed)
-----   Message from Crawford Givens sent at 10/31/2022 10:17 PM EDT -----  Please enter microalbumin from care everywhere in EMR. ----- Message ----- From: Joaquim Nam, MD Sent: 10/31/2022  10:12 PM EDT To: Joaquim Nam, MD  Please enter microalbumin from care everywhere in EMR.

## 2022-11-03 DIAGNOSIS — E119 Type 2 diabetes mellitus without complications: Secondary | ICD-10-CM | POA: Diagnosis not present

## 2022-11-03 LAB — HM DIABETES EYE EXAM

## 2022-11-08 ENCOUNTER — Encounter: Payer: Self-pay | Admitting: Family Medicine

## 2022-11-08 ENCOUNTER — Ambulatory Visit (INDEPENDENT_AMBULATORY_CARE_PROVIDER_SITE_OTHER): Payer: BC Managed Care – PPO | Admitting: Family Medicine

## 2022-11-08 ENCOUNTER — Other Ambulatory Visit (INDEPENDENT_AMBULATORY_CARE_PROVIDER_SITE_OTHER): Payer: BC Managed Care – PPO

## 2022-11-08 VITALS — BP 114/82 | HR 77 | Temp 97.8°F | Ht 64.0 in | Wt 178.0 lb

## 2022-11-08 DIAGNOSIS — R809 Proteinuria, unspecified: Secondary | ICD-10-CM

## 2022-11-08 DIAGNOSIS — E1129 Type 2 diabetes mellitus with other diabetic kidney complication: Secondary | ICD-10-CM

## 2022-11-08 DIAGNOSIS — Z23 Encounter for immunization: Secondary | ICD-10-CM | POA: Diagnosis not present

## 2022-11-08 DIAGNOSIS — I10 Essential (primary) hypertension: Secondary | ICD-10-CM

## 2022-11-08 DIAGNOSIS — Z0000 Encounter for general adult medical examination without abnormal findings: Secondary | ICD-10-CM | POA: Diagnosis not present

## 2022-11-08 DIAGNOSIS — Z7189 Other specified counseling: Secondary | ICD-10-CM

## 2022-11-08 DIAGNOSIS — M25519 Pain in unspecified shoulder: Secondary | ICD-10-CM

## 2022-11-08 LAB — CBC WITH DIFFERENTIAL/PLATELET
Basophils Absolute: 0 10*3/uL (ref 0.0–0.1)
Basophils Relative: 0.4 % (ref 0.0–3.0)
Eosinophils Absolute: 0.1 10*3/uL (ref 0.0–0.7)
Eosinophils Relative: 1.4 % (ref 0.0–5.0)
HCT: 41.9 % (ref 36.0–46.0)
Hemoglobin: 13.7 g/dL (ref 12.0–15.0)
Lymphocytes Relative: 37.3 % (ref 12.0–46.0)
Lymphs Abs: 2.8 10*3/uL (ref 0.7–4.0)
MCHC: 32.6 g/dL (ref 30.0–36.0)
MCV: 94.9 fl (ref 78.0–100.0)
Monocytes Absolute: 0.6 10*3/uL (ref 0.1–1.0)
Monocytes Relative: 8.1 % (ref 3.0–12.0)
Neutro Abs: 4 10*3/uL (ref 1.4–7.7)
Neutrophils Relative %: 52.8 % (ref 43.0–77.0)
Platelets: 403 10*3/uL — ABNORMAL HIGH (ref 150.0–400.0)
RBC: 4.42 Mil/uL (ref 3.87–5.11)
RDW: 12.4 % (ref 11.5–15.5)
WBC: 7.5 10*3/uL (ref 4.0–10.5)

## 2022-11-08 LAB — COMPREHENSIVE METABOLIC PANEL
ALT: 11 U/L (ref 0–35)
AST: 11 U/L (ref 0–37)
Albumin: 4.3 g/dL (ref 3.5–5.2)
Alkaline Phosphatase: 44 U/L (ref 39–117)
BUN: 13 mg/dL (ref 6–23)
CO2: 29 mEq/L (ref 19–32)
Calcium: 9.3 mg/dL (ref 8.4–10.5)
Chloride: 101 mEq/L (ref 96–112)
Creatinine, Ser: 1.01 mg/dL (ref 0.40–1.20)
GFR: 67.93 mL/min (ref >60.00)
Glucose, Bld: 114 mg/dL — ABNORMAL HIGH (ref 70–99)
Potassium: 4.3 mEq/L (ref 3.5–5.1)
Sodium: 139 mEq/L (ref 135–145)
Total Bilirubin: 0.4 mg/dL (ref 0.2–1.2)
Total Protein: 6.9 g/dL (ref 6.0–8.3)

## 2022-11-08 LAB — LIPID PANEL
Cholesterol: 113 mg/dL (ref 0–200)
HDL: 41.2 mg/dL (ref >39.00)
LDL Cholesterol: 57 mg/dL (ref 0–99)
NonHDL: 71.35
Total CHOL/HDL Ratio: 3
Triglycerides: 70 mg/dL (ref 0.0–149.0)
VLDL: 14 mg/dL (ref 0.0–40.0)

## 2022-11-08 LAB — TSH: TSH: 1.38 u[IU]/mL (ref 0.35–5.50)

## 2022-11-08 LAB — HEMOGLOBIN A1C: Hgb A1c MFr Bld: 6 % (ref 4.6–6.5)

## 2022-11-08 MED ORDER — LABETALOL HCL 100 MG PO TABS: 100.0000 mg | ORAL_TABLET | Freq: Two times a day (BID) | ORAL | 3 refills | Status: DC

## 2022-11-08 MED ORDER — SEMAGLUTIDE (1 MG/DOSE) 4 MG/3ML ~~LOC~~ SOPN: 2.0000 mg | PEN_INJECTOR | SUBCUTANEOUS | Status: AC

## 2022-11-08 MED ORDER — DILTIAZEM HCL ER BEADS 420 MG PO CP24: 420.0000 mg | ORAL_CAPSULE | Freq: Every day | ORAL | 3 refills | Status: DC

## 2022-11-08 NOTE — Progress Notes (Unsigned)
CPE- See plan.  Routine anticipatory guidance given to patient.  See health maintenance.  The possibility exists that previously documented standard health maintenance information may have been brought forward from a previous encounter into this note.  If needed, that same information has been updated to reflect the current situation based on today's encounter.    Tetanus 2014 Flu 2024 PNA 2016 Shingles not due Covid prev done Pap not due.   DXA not due Mammogram 2023 Colonoscopy not due Living will d/w pt.  Would have father and stepfather designated equally if she were incapacitated.   Diet and exercise d/w pt.    See after visit summary.  R shoulder pain for months.  Prev neg imaging.  Pain overhead, ext and int rotation.  No injury.  See exam.  Anatomy discussed with patient.  Hypertension:    Using medication without problems or lightheadedness: yes  Chest pain with exertion:no Edema:no Short of breath:no Labs d/w pt.    Diabetes:  Using medications without difficulties: yes Hypoglycemic episodes:no Hyperglycemic episodes:no Feet problems: no Blood Sugars averaging: usually ~130, 96% in range eye exam within last year: yes Has endo f/u pending.    PMH and SH reviewed  Meds, vitals, and allergies reviewed.   ROS: Per HPI.  Unless specifically indicated otherwise in HPI, the patient denies:  General: fever. Eyes: acute vision changes ENT: sore throat Cardiovascular: chest pain Respiratory: SOB GI: vomiting GU: dysuria Musculoskeletal: acute back pain Derm: acute rash Neuro: acute motor dysfunction Psych: worsening mood Endocrine: polydipsia Heme: bleeding Allergy: hayfever  GEN: nad, alert and oriented HEENT: ncat NECK: supple w/o LA CV: rrr. PULM: ctab, no inc wob ABD: soft, +bs EXT: no edema SKIN: no acute rash  R should int/ext rotation pain- improved with scapular manipulation.  No arm drop.  Normal grip.  Strength and sensation grossly normal  otherwise.  Diabetic foot exam: Normal inspection No skin breakdown No calluses  Normal DP pulses Normal sensation to light touch and monofilament Nails normal

## 2022-11-08 NOTE — Patient Instructions (Addendum)
You can call for a mammogram at the [facility] [address] [place] [address] [place]  Check with your insurance to see if they will cover the tetanus/Tdap shot.  Let me know if you don't get call about seeing PT.   Take care.  Glad to see you.

## 2022-11-10 DIAGNOSIS — M25519 Pain in unspecified shoulder: Secondary | ICD-10-CM | POA: Insufficient documentation

## 2022-11-10 NOTE — Therapy (Signed)
OUTPATIENT PHYSICAL THERAPY SHOULDER EVALUATION   Patient Name: Susan Bridges MRN: [id number] DOB:04/04/1980, 42 y.o., female Today's Date: 11/11/2022  END OF SESSION:  PT End of Session - 11/11/22 1544     Visit Number 1    Date for PT Re-Evaluation 02/03/23    Authorization Type BCBS    PT Start Time 1545    PT Stop Time 1630    PT Time Calculation (min) 45 min    Activity Tolerance Patient tolerated treatment well    Behavior During Therapy WFL for tasks assessed/performed             Past Medical History:  Diagnosis Date   Anemia    from heavy menses   Diabetes mellitus    Fibroadenoma of breast    Hyperlipidemia    Hypertension    Obesity    OSA (obstructive sleep apnea)    s/p UPPP   Uterine fibroid    Past Surgical History:  Procedure Laterality Date   ABDOMINAL HYSTERECTOMY     ADRENALECTOMY  2013   Right, done at [facility]   BREAST BIOPSY     BREAST EXCISIONAL BIOPSY Left 06/15/2008   benign   TONSILLECTOMY AND ADENOIDECTOMY     UVULOPALATOPHARYNGOPLASTY     Patient Active Problem List   Diagnosis Date Noted   Shoulder pain 11/10/2022   FH: CAD (coronary artery disease) 12/03/2018   Advance care planning 11/30/2017   Routine general medical examination at a health care facility 03/19/2013   Hemorrhoids 10/25/2011   Hyperaldosteronism (HCC) 07/21/2011   OSA (obstructive sleep apnea) 09/24/2010   Diabetes mellitus with renal manifestation (HCC) 09/24/2010   Essential hypertension, benign 09/24/2010   HLD (hyperlipidemia) 09/24/2010    PCP: Crawford Givens  REFERRING PROVIDER: Crawford Givens  REFERRING DIAG:  M25.519 (ICD-10-CM) - Shoulder pain, unspecified chronicity, unspecified laterality    THERAPY DIAG:  Acute pain of right shoulder  Stiffness of right shoulder, not elsewhere classified  Muscle weakness (generalized)  Rationale for Evaluation and Treatment: Rehabilitation  ONSET DATE: 11/08/22  SUBJECTIVE:                                                                                                                                                                                       SUBJECTIVE STATEMENT: My R shoulder is painful, it is throughout the day. If I reach to high, grabbing something, it can get bad at night. Last few nights I have been trying to sleep on my back. I feel like my range of motion is getting worse. This has been going on since July.   Hand dominance: Right  PERTINENT HISTORY: DM, HTN  PAIN:  Are you having pain? Yes: NPRS scale: 7/10 Pain location: posterior shoulder radiates down into triceps and under axilla Pain description: constant, dull Aggravating factors: reaching, reaching behind back Relieving factors: nothing really  PRECAUTIONS: None  RED FLAGS: None   WEIGHT BEARING RESTRICTIONS: No  FALLS:  Has patient fallen in last 6 months? No  LIVING ENVIRONMENT: Lives with: lives with their family Lives in: House/apartment  OCCUPATION: Works from a desk  PLOF: Independent  PATIENT GOALS: fix whatever is going on   NEXT MD VISIT:   OBJECTIVE:   DIAGNOSTIC FINDINGS: N/A  PATIENT SURVEYS:  FOTO 53  COGNITION: Overall cognitive status: Within functional limits for tasks assessed     SENSATION: WFL  UPPER EXTREMITY ROM:   Active ROM Right eval Left eval  Shoulder flexion 115 WNL  Shoulder extension    Shoulder abduction 105 WNL  Shoulder adduction    Shoulder internal rotation 20   Shoulder external rotation 40   Elbow flexion    Elbow extension    Wrist flexion    Wrist extension    Wrist ulnar deviation    Wrist radial deviation    Wrist pronation    Wrist supination    (Blank rows = not tested)  UPPER EXTREMITY MMT:  MMT Right eval Left eval  Shoulder flexion 3+ with pain   Shoulder extension    Shoulder abduction 3+ with pain   Shoulder adduction    Shoulder internal rotation 4-   Shoulder external rotation 4-   Middle trapezius     Lower trapezius    Elbow flexion    Elbow extension    Wrist flexion    Wrist extension    Wrist ulnar deviation    Wrist radial deviation    Wrist pronation    Wrist supination    Grip strength (lbs)    (Blank rows = not tested)  SHOULDER SPECIAL TESTS: Impingement tests: Neer impingement test: positive , Hawkins/Kennedy impingement test: positive , and Painful arc test: positive  Rotator cuff assessment: Empty can test: positive  Biceps assessment: Speed's test: negative  JOINT MOBILITY TESTING:  PROM unable to get to full range with flexion, abd, IR due to pain  PALPATION:  TTP along posterior R shoulder    TODAY'S TREATMENT:                                                                                                                                         DATE: EVAL 11/11/22   PATIENT EDUCATION: Education details: POC and HEP Person educated: Patient Education method: Explanation Education comprehension: verbalized understanding  HOME EXERCISE PROGRAM: Access Code: [email or web address] URL: [email or web address] Date: 11/11/2022 Prepared by: Cassie Freer  Exercises - Standing Shoulder Internal Rotation AAROM with Dowel  - 1 x daily - 7 x weekly - 2 sets - 10 reps - Standing Shoulder Internal Rotation Stretch with Towel  -  1 x daily - 7 x weekly - 2 sets - 10 reps - Standing Bilateral Shoulder Internal Rotation AAROM with Dowel  - 1 x daily - 7 x weekly - 2 sets - 10 reps - Standing Shoulder Flexion ROM with Dowel  - 1 x daily - 7 x weekly - 2 sets - 10 reps - Standing Shoulder Abduction ROM with Dowel  - 1 x daily - 7 x weekly - 2 sets - 10 reps - Standing Shoulder Extension ROM with Dowel  - 1 x daily - 7 x weekly - 2 sets - 10 reps  ASSESSMENT:  CLINICAL IMPRESSION: Patient is a 42 y.o. female who was seen today for physical therapy evaluation and treatment for R shoulder pain. She reports ongoing pain since July that has gradually worsened. She has lost  some ROM in her R shoulder. She could possibly have adhesive capsulitis but does not follow the capsular pattern. Her biggest ROM deficits are with internal rotation, then abduction, and then flexion. Patient presents with positive impingement special testing and possible inflammation in R shoulder. She reports difficulty mostly with reaching behind her back and reaching overhead. With PROM she is able to get a little further, but pain limits the ability to get full range. HEP initiated to work only on ROM for now. Patient will benefit from skilled PT to address her R shoulder impairments to be able to get WNL to reach overhead and complete work and household chores with ease. Agreed to 1x a week due to high copay.   OBJECTIVE IMPAIRMENTS: impaired UE functional use and pain.   ACTIVITY LIMITATIONS: reach over head  PARTICIPATION LIMITATIONS: cleaning, laundry, and occupation  REHAB POTENTIAL: Good  CLINICAL DECISION MAKING: Stable/uncomplicated  EVALUATION COMPLEXITY: Low   GOALS: Goals reviewed with patient? Yes  SHORT TERM GOALS: Target date: 12/23/22  Patient will be independent with initial HEP.  Goal status: INITIAL   LONG TERM GOALS: Target date: 02/03/23 Patient will be independent with advanced/ongoing HEP to improve outcomes and carryover.  Goal status: INITIAL  2.  Patient will report 75% improvement in R shoulder pain to improve QOL.  Baseline: 7/10 Goal status: INITIAL  3.  Patient to improve R shoulder AROM to WNL without pain provocation to allow for increased ease of ADLs.  Baseline: see chart Goal status: INITIAL  4.  Patient will demonstrate improved functional UE strength as demonstrated by 5/5. Baseline: 3+ Goal status: INITIAL  5.  Patient will be able to reach overhead and behind her back to complete ADLs    Baseline: unable to do Goal status: INITIAL   PLAN:  PT FREQUENCY: 1x/week  PT DURATION: 12 weeks  PLANNED INTERVENTIONS: Therapeutic  exercises, Therapeutic activity, Neuromuscular re-education, Balance training, Gait training, Patient/Family education, Self Care, Joint mobilization, Dry Needling, Electrical stimulation, Cryotherapy, Moist heat, Vasopneumatic device, Ionotophoresis 4mg /ml Dexamethasone, and Manual therapy  PLAN FOR NEXT SESSION: R shoulder PROM, AAROM, pain management with modalities as needed   Cassie Freer, PT 11/11/2022, 4:41 PM

## 2022-11-10 NOTE — Assessment & Plan Note (Signed)
Living will d/w pt.  Would have father and stepfather designated equally if she were incapacitated.

## 2022-11-10 NOTE — Assessment & Plan Note (Signed)
Labs discussed with patient.  She is going to follow-up with endocrinology.  Continue work on diet and exercise.  Continue lisinopril metformin and Ozempic.  I thanked her for her effort.

## 2022-11-10 NOTE — Assessment & Plan Note (Signed)
Concern for rotator cuff irritation.  Discussed with patient.  She clearly had improvement in pain on range of motion with scapular manipulation so I think physical therapy would be a reasonable option.  She can give me a call if she does not get set up with physical therapy.

## 2022-11-10 NOTE — Assessment & Plan Note (Signed)
Tetanus 2014 Flu 2024 PNA 2016 Shingles not due Covid prev done Pap not due.   DXA not due Mammogram 2023 Colonoscopy not due Living will d/w pt.  Would have father and stepfather designated equally if she were incapacitated.   Diet and exercise d/w pt.

## 2022-11-10 NOTE — Assessment & Plan Note (Signed)
Continue lisinopril labetalol hydrochlorothiazide diltiazem.  Continue work on diet and exercise.  Continue atorvastatin.  Labs discussed with patient.

## 2022-11-11 ENCOUNTER — Ambulatory Visit: Payer: BC Managed Care – PPO | Attending: Family Medicine

## 2022-11-11 DIAGNOSIS — M25511 Pain in right shoulder: Secondary | ICD-10-CM | POA: Diagnosis not present

## 2022-11-11 DIAGNOSIS — M6281 Muscle weakness (generalized): Secondary | ICD-10-CM | POA: Diagnosis not present

## 2022-11-11 DIAGNOSIS — M25611 Stiffness of right shoulder, not elsewhere classified: Secondary | ICD-10-CM | POA: Diagnosis not present

## 2022-11-11 DIAGNOSIS — M25519 Pain in unspecified shoulder: Secondary | ICD-10-CM | POA: Diagnosis not present

## 2022-11-14 NOTE — Progress Notes (Signed)
Agree.  Thanks.   Physician Signature: Crawford Givens _ Date:______09/29/24 _ Time:__10:16 AM ___

## 2022-11-15 DIAGNOSIS — I10 Essential (primary) hypertension: Secondary | ICD-10-CM | POA: Diagnosis not present

## 2022-11-15 DIAGNOSIS — E119 Type 2 diabetes mellitus without complications: Secondary | ICD-10-CM | POA: Diagnosis not present

## 2022-11-23 ENCOUNTER — Encounter: Payer: BC Managed Care – PPO | Admitting: Physical Therapy

## 2022-11-24 NOTE — Therapy (Signed)
OUTPATIENT PHYSICAL THERAPY SHOULDER TREATMENT   Patient Name: Susan Bridges MRN: [id number] DOB:February 06, 1981, 42 y.o., female Today's Date: 11/25/2022  END OF SESSION:  PT End of Session - 11/25/22 1530     Visit Number 2    Date for PT Re-Evaluation 02/03/23    Authorization Type BCBS    PT Start Time 1545    PT Stop Time 1630    PT Time Calculation (min) 45 min    Activity Tolerance Patient tolerated treatment well    Behavior During Therapy WFL for tasks assessed/performed              Past Medical History:  Diagnosis Date   Anemia    from heavy menses   Diabetes mellitus    Fibroadenoma of breast    Hyperlipidemia    Hypertension    Obesity    OSA (obstructive sleep apnea)    s/p UPPP   Uterine fibroid    Past Surgical History:  Procedure Laterality Date   ABDOMINAL HYSTERECTOMY     ADRENALECTOMY  2013   Right, done at [facility]   BREAST BIOPSY     BREAST EXCISIONAL BIOPSY Left 06/15/2008   benign   TONSILLECTOMY AND ADENOIDECTOMY     UVULOPALATOPHARYNGOPLASTY     Patient Active Problem List   Diagnosis Date Noted   Shoulder pain 11/10/2022   FH: CAD (coronary artery disease) 12/03/2018   Advance care planning 11/30/2017   Routine general medical examination at a health care facility 03/19/2013   Hemorrhoids 10/25/2011   Hyperaldosteronism (HCC) 07/21/2011   OSA (obstructive sleep apnea) 09/24/2010   Diabetes mellitus with renal manifestation (HCC) 09/24/2010   Essential hypertension, benign 09/24/2010   HLD (hyperlipidemia) 09/24/2010    PCP: Crawford Givens  REFERRING PROVIDER: Crawford Givens  REFERRING DIAG:  M25.519 (ICD-10-CM) - Shoulder pain, unspecified chronicity, unspecified laterality    THERAPY DIAG:  Acute pain of right shoulder  Stiffness of right shoulder, not elsewhere classified  Rationale for Evaluation and Treatment: Rehabilitation  ONSET DATE: 11/08/22  SUBJECTIVE:                                                                                                                                                                                       SUBJECTIVE STATEMENT: Shoulder feels the same. I have been doing the exercises, but not every day.   Hand dominance: Right  PERTINENT HISTORY: DM, HTN  PAIN:  Are you having pain? Yes: NPRS scale: 7/10 Pain location: posterior shoulder radiates down into triceps and under axilla Pain description: constant, dull Aggravating factors: reaching, reaching behind back Relieving factors: nothing really  PRECAUTIONS: None  RED  FLAGS: None   WEIGHT BEARING RESTRICTIONS: No  FALLS:  Has patient fallen in last 6 months? No  LIVING ENVIRONMENT: Lives with: lives with their family Lives in: House/apartment  OCCUPATION: Works from a desk  PLOF: Independent  PATIENT GOALS: fix whatever is going on   NEXT MD VISIT:   OBJECTIVE:   DIAGNOSTIC FINDINGS: N/A  PATIENT SURVEYS:  FOTO 53  COGNITION: Overall cognitive status: Within functional limits for tasks assessed     SENSATION: WFL  UPPER EXTREMITY ROM:   Active ROM Right eval Left eval  Shoulder flexion 115 WNL  Shoulder extension    Shoulder abduction 105 WNL  Shoulder adduction    Shoulder internal rotation 20   Shoulder external rotation 40   Elbow flexion    Elbow extension    Wrist flexion    Wrist extension    Wrist ulnar deviation    Wrist radial deviation    Wrist pronation    Wrist supination    (Blank rows = not tested)  UPPER EXTREMITY MMT:  MMT Right eval Left eval  Shoulder flexion 3+ with pain   Shoulder extension    Shoulder abduction 3+ with pain   Shoulder adduction    Shoulder internal rotation 4-   Shoulder external rotation 4-   Middle trapezius    Lower trapezius    Elbow flexion    Elbow extension    Wrist flexion    Wrist extension    Wrist ulnar deviation    Wrist radial deviation    Wrist pronation    Wrist supination    Grip strength  (lbs)    (Blank rows = not tested)  SHOULDER SPECIAL TESTS: Impingement tests: Neer impingement test: positive , Hawkins/Kennedy impingement test: positive , and Painful arc test: positive  Rotator cuff assessment: Empty can test: positive  Biceps assessment: Speed's test: negative  JOINT MOBILITY TESTING:  PROM unable to get to full range with flexion, abd, IR due to pain  PALPATION:  TTP along posterior R shoulder    TODAY'S TREATMENT:                                                                                                                                         DATE:  11/25/22 UBE L2 x55mins each way  AAROM all directions with dowel x10 PROM supine in all directions, grade 3 mobs inferior and posterior AAROM supine shoulder flexion Rows and ext RED 2x10 MH to R shoulder 10 mins  Ionto patch to R shoulder #1   EVAL 11/11/22   PATIENT EDUCATION: Education details: POC and HEP Person educated: Patient Education method: Explanation Education comprehension: verbalized understanding  HOME EXERCISE PROGRAM: Access Code: [email or web address] URL: [email or web address] Date: 11/11/2022 Prepared by: Cassie Freer  Exercises - Standing Shoulder Internal Rotation AAROM with Dowel  - 1 x daily - 7 x weekly - 2 sets - 10  reps - Standing Shoulder Internal Rotation Stretch with Towel  - 1 x daily - 7 x weekly - 2 sets - 10 reps - Standing Bilateral Shoulder Internal Rotation AAROM with Dowel  - 1 x daily - 7 x weekly - 2 sets - 10 reps - Standing Shoulder Flexion ROM with Dowel  - 1 x daily - 7 x weekly - 2 sets - 10 reps - Standing Shoulder Abduction ROM with Dowel  - 1 x daily - 7 x weekly - 2 sets - 10 reps - Standing Shoulder Extension ROM with Dowel  - 1 x daily - 7 x weekly - 2 sets - 10 reps  ASSESSMENT:  CLINICAL IMPRESSION: Patient is a 42 y.o. female who was seen today for physical therapy treatment for R shoulder pain. She presents with positive impingement  special testing and possible inflammation in R shoulder. She reports difficulty mostly with reaching behind her back and reaching overhead. Started a with some light ROM and strengthening as tolerated. Muscle guarding with PROM due to pain. Still limited in all directions but does better with ER/IR compared to flexion/abduction. Modalities for pain used as deemed appropriate.    **Agreed to 1x a week due to high copay.   OBJECTIVE IMPAIRMENTS: impaired UE functional use and pain.   ACTIVITY LIMITATIONS: reach over head  PARTICIPATION LIMITATIONS: cleaning, laundry, and occupation  REHAB POTENTIAL: Good  CLINICAL DECISION MAKING: Stable/uncomplicated  EVALUATION COMPLEXITY: Low   GOALS: Goals reviewed with patient? Yes  SHORT TERM GOALS: Target date: 12/23/22  Patient will be independent with initial HEP.  Goal status: INITIAL   LONG TERM GOALS: Target date: 02/03/23 Patient will be independent with advanced/ongoing HEP to improve outcomes and carryover.  Goal status: INITIAL  2.  Patient will report 75% improvement in R shoulder pain to improve QOL.  Baseline: 7/10 Goal status: INITIAL  3.  Patient to improve R shoulder AROM to WNL without pain provocation to allow for increased ease of ADLs.  Baseline: see chart Goal status: INITIAL  4.  Patient will demonstrate improved functional UE strength as demonstrated by 5/5. Baseline: 3+ Goal status: INITIAL  5.  Patient will be able to reach overhead and behind her back to complete ADLs    Baseline: unable to do Goal status: INITIAL   PLAN:  PT FREQUENCY: 1x/week  PT DURATION: 12 weeks  PLANNED INTERVENTIONS: Therapeutic exercises, Therapeutic activity, Neuromuscular re-education, Balance training, Gait training, Patient/Family education, Self Care, Joint mobilization, Dry Needling, Electrical stimulation, Cryotherapy, Moist heat, Vasopneumatic device, Ionotophoresis 4mg /ml Dexamethasone, and Manual therapy  PLAN FOR  NEXT SESSION: R shoulder PROM, AAROM, pain management with modalities as needed   Cassie Freer, PT 11/25/2022, 4:28 PM

## 2022-11-25 ENCOUNTER — Ambulatory Visit: Payer: BC Managed Care – PPO | Attending: Family Medicine

## 2022-11-25 DIAGNOSIS — M25511 Pain in right shoulder: Secondary | ICD-10-CM | POA: Insufficient documentation

## 2022-11-25 DIAGNOSIS — M25611 Stiffness of right shoulder, not elsewhere classified: Secondary | ICD-10-CM | POA: Insufficient documentation

## 2022-12-01 ENCOUNTER — Ambulatory Visit: Payer: BC Managed Care – PPO

## 2022-12-08 ENCOUNTER — Ambulatory Visit: Payer: BC Managed Care – PPO

## 2022-12-08 ENCOUNTER — Other Ambulatory Visit: Payer: Self-pay | Admitting: Family Medicine

## 2022-12-08 DIAGNOSIS — Z1231 Encounter for screening mammogram for malignant neoplasm of breast: Secondary | ICD-10-CM

## 2022-12-14 ENCOUNTER — Ambulatory Visit: Payer: BC Managed Care – PPO

## 2023-01-03 ENCOUNTER — Ambulatory Visit: Payer: BC Managed Care – PPO

## 2023-01-11 ENCOUNTER — Other Ambulatory Visit: Payer: Self-pay

## 2023-01-11 MED ORDER — ATORVASTATIN CALCIUM 20 MG PO TABS: 20.0000 mg | ORAL_TABLET | Freq: Every day | ORAL | 0 refills | Status: DC

## 2023-01-11 MED ORDER — LISINOPRIL 10 MG PO TABS: 10.0000 mg | ORAL_TABLET | Freq: Every day | ORAL | 0 refills | Status: DC

## 2023-01-12 ENCOUNTER — Other Ambulatory Visit: Payer: Self-pay

## 2023-01-12 MED ORDER — HYDROCHLOROTHIAZIDE 25 MG PO TABS: 25.0000 mg | ORAL_TABLET | Freq: Every day | ORAL | 0 refills | Status: DC

## 2023-02-01 ENCOUNTER — Ambulatory Visit: Payer: BC Managed Care – PPO

## 2023-02-11 ENCOUNTER — Telehealth: Payer: Self-pay

## 2023-02-11 MED ORDER — LISINOPRIL 10 MG PO TABS: 10.0000 mg | ORAL_TABLET | Freq: Every day | ORAL | 3 refills | Status: DC

## 2023-02-11 MED ORDER — ATORVASTATIN CALCIUM 20 MG PO TABS: 20.0000 mg | ORAL_TABLET | Freq: Every day | ORAL | 3 refills | Status: DC

## 2023-02-11 NOTE — Telephone Encounter (Signed)
Received refill request for medications that were sent last month to [facility] for 90 day supply ok to send new script in?  Requested medication pended

## 2023-02-11 NOTE — Telephone Encounter (Signed)
I sent them to [facility].  If that isn't the desired pharmacy, then let me know.  Thanks.

## 2023-02-23 ENCOUNTER — Ambulatory Visit
Admission: RE | Admit: 2023-02-23 | Discharge: 2023-02-23 | Disposition: A | Discharge status: Home / Self Care | Payer: BC Managed Care – PPO | Source: Ambulatory Visit | Attending: Family Medicine | Admitting: Family Medicine

## 2023-02-23 ENCOUNTER — Ambulatory Visit: Payer: BC Managed Care – PPO

## 2023-02-23 DIAGNOSIS — Z1231 Encounter for screening mammogram for malignant neoplasm of breast: Secondary | ICD-10-CM

## 2023-03-04 ENCOUNTER — Ambulatory Visit: Payer: BC Managed Care – PPO

## 2023-04-27 ENCOUNTER — Other Ambulatory Visit: Payer: Self-pay

## 2023-04-27 MED ORDER — HYDROCHLOROTHIAZIDE 25 MG PO TABS: 25.0000 mg | ORAL_TABLET | Freq: Every day | ORAL | 0 refills | Status: DC

## 2023-05-25 DIAGNOSIS — E119 Type 2 diabetes mellitus without complications: Secondary | ICD-10-CM | POA: Diagnosis not present

## 2023-05-25 DIAGNOSIS — I10 Essential (primary) hypertension: Secondary | ICD-10-CM | POA: Diagnosis not present

## 2023-05-25 LAB — HEMOGLOBIN A1C: Hemoglobin A1C: 5.6

## 2023-05-27 DIAGNOSIS — I10 Essential (primary) hypertension: Secondary | ICD-10-CM | POA: Diagnosis not present

## 2023-05-27 DIAGNOSIS — E119 Type 2 diabetes mellitus without complications: Secondary | ICD-10-CM | POA: Diagnosis not present

## 2023-07-20 ENCOUNTER — Other Ambulatory Visit: Payer: Self-pay

## 2023-07-20 MED ORDER — HYDROCHLOROTHIAZIDE 25 MG PO TABS: 25.0000 mg | ORAL_TABLET | Freq: Every day | ORAL | 1 refills | Status: DC

## 2023-08-05 ENCOUNTER — Encounter: Payer: Self-pay | Admitting: Primary Care

## 2023-08-05 ENCOUNTER — Ambulatory Visit: Payer: Self-pay

## 2023-08-05 ENCOUNTER — Ambulatory Visit: Admitting: Primary Care

## 2023-08-05 ENCOUNTER — Ambulatory Visit: Payer: Self-pay | Admitting: Primary Care

## 2023-08-05 ENCOUNTER — Encounter: Payer: Self-pay | Admitting: Family Medicine

## 2023-08-05 VITALS — BP 108/82 | HR 86 | Temp 97.4°F | Ht 64.0 in | Wt 186.0 lb

## 2023-08-05 DIAGNOSIS — E559 Vitamin D deficiency, unspecified: Secondary | ICD-10-CM

## 2023-08-05 DIAGNOSIS — R079 Chest pain, unspecified: Secondary | ICD-10-CM | POA: Insufficient documentation

## 2023-08-05 LAB — CBC
HCT: 41.9 % (ref 36.0–46.0)
Hemoglobin: 14 g/dL (ref 12.0–15.0)
MCHC: 33.4 g/dL (ref 30.0–36.0)
MCV: 93.2 fl (ref 78.0–100.0)
Platelets: 423 10*3/uL — ABNORMAL HIGH (ref 150.0–400.0)
RBC: 4.49 Mil/uL (ref 3.87–5.11)
RDW: 12.5 % (ref 11.5–15.5)
WBC: 6.3 10*3/uL (ref 4.0–10.5)

## 2023-08-05 LAB — BASIC METABOLIC PANEL WITH GFR
BUN: 8 mg/dL (ref 6–23)
CO2: 29 meq/L (ref 19–32)
Calcium: 9.6 mg/dL (ref 8.4–10.5)
Chloride: 99 meq/L (ref 96–112)
Creatinine, Ser: 0.89 mg/dL (ref 0.40–1.20)
GFR: 78.66 mL/min (ref >60.00)
Glucose, Bld: 103 mg/dL — ABNORMAL HIGH (ref 70–99)
Potassium: 4.2 meq/L (ref 3.5–5.1)
Sodium: 136 meq/L (ref 135–145)

## 2023-08-05 LAB — VITAMIN D 25 HYDROXY (VIT D DEFICIENCY, FRACTURES): VITD: 28.59 ng/mL — ABNORMAL LOW (ref 30.00–100.00)

## 2023-08-05 NOTE — Patient Instructions (Signed)
 Stop by the lab prior to leaving today. I will notify you of your results once received.   You will receive a phone call regarding the CT scan of the heart.  Try famotidine (Pepcid) 20 mg once daily for potential heartburn.  It was a pleasure meeting you!

## 2023-08-05 NOTE — Progress Notes (Signed)
 Subjective:    Patient ID: Susan Bridges, female    DOB: 1980/07/17, 43 y.o.   MRN: [id number]  Chest Pain  Pertinent negatives include no diaphoresis, dizziness, headaches or shortness of breath.    Susan Bridges is a very pleasant 43 y.o. female patient of Dr. Vallarie Gauze with a history of hypertension, OSA, type 2 diabetes, hyperlipidemia, hyperaldosteronism who presents today to discuss chest pain.  Her symptoms initially began about four days ago with right sided chest pain. Her pain occurred constantly throughout the day, eventually resolved. She denies a pain, but she explains it as a discomfort/pressure.   Her symptoms returned yesterday with right sided chest discomfort that occurred while resting at home. Prior to symptom onset she had been running a few errands. Her pain has been constant since then but less intense today.  She denies shortness of breath, diaphoresis, nausea, esophageal burning, lifting anything heavy, increased fatigue, abdominal pain, pain with deep inspiration, cough/cold symptoms. She took Tylenol  four days ago which did not help.  She cannot provoke her pain.  She has a family history of heart disease in her mother, CHF. Her paternal grandfather has a history of heart disease with bypass surgery. Sudden death in her cousin.   She was evaluated by cardiology in 2020, underwent echocardiogram which was without abnormality. She is compliant to her atorvastatin  20 mg daily. She does have a history of anxiety, with symptoms of irritability and mind racing thoughts at night.   BP Readings from Last 3 Encounters:  08/05/23 108/82  11/08/22 114/82  11/06/21 106/80     Review of Systems  Constitutional:  Positive for fatigue. Negative for diaphoresis.  Respiratory:  Negative for shortness of breath.   Cardiovascular:  Positive for chest pain.  Neurological:  Negative for dizziness and headaches.  Psychiatric/Behavioral:  The patient is  nervous/anxious.          Past Medical History:  Diagnosis Date   Anemia    from heavy menses   Diabetes mellitus    Fibroadenoma of breast    Hyperlipidemia    Hypertension    Obesity    OSA (obstructive sleep apnea)    s/p UPPP   Uterine fibroid     Social History   Socioeconomic History   Marital status: Single    Spouse name: Not on file   Number of children: Not on file   Years of education: Not on file   Highest education level: Not on file  Occupational History   Not on file  Tobacco Use   Smoking status: Never   Smokeless tobacco: Never  Vaping Use   Vaping status: Never Used  Substance and Sexual Activity   Alcohol use: Yes    Alcohol/week: 0.0 standard drinks of alcohol    Comment: socially   Drug use: No   Sexual activity: Not Currently    Partners: Male    Birth control/protection: None  Other Topics Concern   Not on file  Social History Narrative   [place]   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   She is managing multiple departments with social work   Social Drivers of Corporate investment banker Strain: Not on file  Food Insecurity: Not on file  Transportation Needs: Not on file  Physical Activity: Not on file  Stress: Not on file  Social Connections: Not on file  Intimate Partner Violence: Not on file    Past Surgical History:  Procedure Laterality Date   ABDOMINAL HYSTERECTOMY     ADRENALECTOMY  2013   Right, done at [facility]   BREAST BIOPSY     BREAST EXCISIONAL BIOPSY Left 06/15/2008   benign   TONSILLECTOMY AND ADENOIDECTOMY     UVULOPALATOPHARYNGOPLASTY      Family History  Problem Relation Age of Onset   Heart disease Mother    Hypertension Mother    Diabetes Mother    Diabetes Father    Hypertension Father    Hyperlipidemia Father    Pulmonary embolism Father    Diabetes Maternal Grandmother    Stroke Maternal Grandmother    Breast cancer Paternal Grandmother    Heart disease Paternal Grandfather     Hypertension Paternal Grandfather    Breast cancer Other    Colon cancer Neg Hx     Allergies  Allergen Reactions   Amlodipine     Avoid CCBs due to gum disease.    Calcium  Channel Blockers     Avoid CCBs other than diltiazem  due to gum disease.    Nifedipine Swelling    Swollen gums    Current Outpatient Medications on File Prior to Visit  Medication Sig Dispense Refill   atorvastatin  (LIPITOR) 20 MG tablet Take 1 tablet (20 mg total) by mouth daily. 90 tablet 3   diltiazem  (TIADYLT  ER) 420 MG 24 hr capsule Take 1 capsule (420 mg total) by mouth daily. 90 capsule 3   hydrochlorothiazide  (HYDRODIURIL ) 25 MG tablet Take 1 tablet (25 mg total) by mouth daily. 90 tablet 1   labetalol  (NORMODYNE ) 100 MG tablet Take 1 tablet (100 mg total) by mouth 2 (two) times daily. 180 tablet 3   lisinopril  (ZESTRIL ) 10 MG tablet Take 1 tablet (10 mg total) by mouth daily. 90 tablet 3   metFORMIN  (GLUMETZA ) 1000 MG (MOD) 24 hr tablet Take 1 tablet (1,000 mg total) by mouth 2 (two) times daily.     Semaglutide , 1 MG/DOSE, 4 MG/3ML SOPN Inject 2 mg as directed once a week.     No current facility-administered medications on file prior to visit.    BP 108/82   Pulse 86   Temp (!) 97.4 F (36.3 C) (Temporal)   Ht 5' 4 (1.626 m)   Wt 186 lb (84.4 kg)   LMP 03/14/2016   SpO2 96%   BMI 31.93 kg/m  Objective:   Physical Exam  Cardiovascular:     Rate and Rhythm: Normal rate and regular rhythm.  Pulmonary:     Effort: Pulmonary effort is normal.     Breath sounds: Normal breath sounds.   Musculoskeletal:     Cervical back: Neck supple.   Skin:    General: Skin is warm and dry.   Neurological:     Mental Status: She is alert and oriented to person, place, and time.   Psychiatric:        Mood and Affect: Mood normal.           Assessment & Plan:  Chest pain, unspecified type Assessment & Plan: Unclear etiology.  Differentials include  MSK cause, esophageal reflux,  respiratory involvement, anxiety. Lower likelihood for PE but will keep on differential list.  She does have a significant family history of CAD, reviewed cardiology visit from 2020. Reviewed labs from April 2020 through Care Everywhere.  Diabetes and lipids under excellent control.  Continue atorvastatin  20 mg daily.  EKG today with NSR with rate of 81.  No PAC/PVCs or acute ST changes.  Appears similar to EKG from 2020.  Coronary calcium  CT scan ordered and pending. Labs pending today for CBC, BMP, D-dimer. Will defer chest x-ray given lack of respiratory symptoms.  We did discuss that she could start famotidine 20 mg to see if this helps with her symptoms.  Orders: -     EKG 12-Lead -     CT CARDIAC SCORING (SELF PAY ONLY); Future -     D-dimer, quantitative -     CBC -     Basic metabolic panel with GFR  Vitamin D deficiency -     VITAMIN D 25 Hydroxy (Vit-D Deficiency, Fractures)        Gabriel John, NP

## 2023-08-05 NOTE — Telephone Encounter (Signed)
 Patient evaluated.

## 2023-08-05 NOTE — Assessment & Plan Note (Addendum)
 Unclear etiology.  Differentials include  MSK cause, esophageal reflux, respiratory involvement, anxiety. Lower likelihood for PE but will keep on differential list.  She does have a significant family history of CAD, reviewed cardiology visit from 2020. Reviewed labs from April 2020 through Care Everywhere.  Diabetes and lipids under excellent control.  Continue atorvastatin  20 mg daily.  EKG today with NSR with rate of 81.  No PAC/PVCs or acute ST changes.  Appears similar to EKG from 2020.  Coronary calcium  CT scan ordered and pending. Labs pending today for CBC, BMP, D-dimer. Will defer chest x-ray given lack of respiratory symptoms.  We did discuss that she could start famotidine 20 mg to see if this helps with her symptoms.

## 2023-08-05 NOTE — Telephone Encounter (Signed)
 FYI Only or Action Required?: FYI only for provider.  Patient was last seen in primary care on 11/08/2022 by Donnie Galea, MD. Called Nurse Triage reporting Chest Pain. Symptoms began yesterday. Interventions attempted: Nothing. Symptoms are: stable.  Triage Disposition: See Physician Within 24 Hours  Patient/caregiver understands and will follow disposition?: Yes    Copied from CRM [id number]. Topic: Clinical - Red Word Triage >> Aug 05, 2023  8:04 AM Hassie Lint wrote: Red Word that prompted transfer to Nurse Triage: Patient is experiencing pain in her upper right part of her chest. Symptom started a few days ago but since 6 pm last night it has been constant. Reason for Disposition  [1] Chest pain lasts > 5 minutes AND [2] occurred > 3 days ago (72 hours) AND [3] NO chest pain or cardiac symptoms now  Answer Assessment - Initial Assessment Questions 1. LOCATION: Where does it hurt?     Upper right chest area -above right breast 2. RADIATION: Does the pain go anywhere else? (e.g., into neck, jaw, arms, back)     no 3. ONSET: When did the chest pain begin? (Minutes, hours or days)      yesterday 4. PATTERN: Does the pain come and go, or has it been constant since it started?  Does it get worse with exertion?      Comes and goes - happened the other day then yesterday 5. DURATION: How long does it last (e.g., seconds, minutes, hours)     unknown 6. SEVERITY: How bad is the pain?  (e.g., Scale 1-10; mild, moderate, or severe)    - MILD (1-3): doesn't interfere with normal activities     - MODERATE (4-7): interferes with normal activities or awakens from sleep    - SEVERE (8-10): excruciating pain, unable to do any normal activities       6 or 7/10 - pt stated last night I felt like possibly indigestion then she burped and a little later discomfort went away 7. CARDIAC RISK FACTORS: Do you have any history of heart problems or risk factors for heart disease? (e.g.,  angina, prior heart attack; diabetes, high blood pressure, high cholesterol, smoker, or strong family history of heart disease)     N/a 8. PULMONARY RISK FACTORS: Do you have any history of lung disease?  (e.g., blood clots in lung, asthma, emphysema, birth control pills)     N/a 9. CAUSE: What do you think is causing the chest pain?     Possible indigestion: 10. OTHER SYMPTOMS: Do you have any other symptoms? (e.g., dizziness, nausea, vomiting, sweating, fever, difficulty breathing, cough)       no 11. PREGNANCY: Is there any chance you are pregnant? When was your last menstrual period?       N/a BP 126/94  - checked last night: pt stated currently not having any discomfort  Protocols used: Chest Pain-A-AH

## 2023-08-06 LAB — D-DIMER, QUANTITATIVE: D-Dimer, Quant: 0.34 ug{FEU}/mL (ref <0.50)

## 2023-08-07 NOTE — Telephone Encounter (Signed)
 Noted. Thanks.

## 2023-08-08 ENCOUNTER — Encounter: Payer: Self-pay | Admitting: Primary Care

## 2023-09-14 ENCOUNTER — Ambulatory Visit (HOSPITAL_BASED_OUTPATIENT_CLINIC_OR_DEPARTMENT_OTHER)
Admission: RE | Admit: 2023-09-14 | Discharge: 2023-09-14 | Disposition: A | Discharge status: Home / Self Care | Payer: Self-pay | Source: Ambulatory Visit | Attending: Primary Care | Admitting: Primary Care

## 2023-09-14 DIAGNOSIS — R079 Chest pain, unspecified: Secondary | ICD-10-CM

## 2023-11-28 DIAGNOSIS — I10 Essential (primary) hypertension: Secondary | ICD-10-CM | POA: Diagnosis not present

## 2023-11-28 DIAGNOSIS — E119 Type 2 diabetes mellitus without complications: Secondary | ICD-10-CM | POA: Diagnosis not present

## 2023-11-28 DIAGNOSIS — Z23 Encounter for immunization: Secondary | ICD-10-CM | POA: Diagnosis not present

## 2023-12-07 DIAGNOSIS — E119 Type 2 diabetes mellitus without complications: Secondary | ICD-10-CM | POA: Diagnosis not present

## 2023-12-15 ENCOUNTER — Other Ambulatory Visit: Payer: Self-pay

## 2023-12-15 NOTE — Telephone Encounter (Signed)
 Fax received for refill on Labetalol . Patient does not have visit scheduled. Last appointment was 11/08/22. Ok to refill as pended? I have added note that she needs office visit to refill. Do you want us  to call as well?

## 2023-12-16 MED ORDER — LABETALOL HCL 100 MG PO TABS: 100.0000 mg | ORAL_TABLET | Freq: Two times a day (BID) | ORAL | 0 refills | Status: DC

## 2023-12-16 MED ORDER — HYDROCHLOROTHIAZIDE 25 MG PO TABS: 25.0000 mg | ORAL_TABLET | Freq: Every day | ORAL | 0 refills | Status: DC

## 2023-12-16 NOTE — Telephone Encounter (Signed)
 Sent with note to schedule.  She should be able to schedule off that.  Thanks.

## 2024-01-04 ENCOUNTER — Other Ambulatory Visit: Payer: Self-pay

## 2024-01-04 DIAGNOSIS — I10 Essential (primary) hypertension: Secondary | ICD-10-CM

## 2024-01-04 DIAGNOSIS — E785 Hyperlipidemia, unspecified: Secondary | ICD-10-CM

## 2024-01-04 NOTE — Telephone Encounter (Signed)
 Received refill request from pharmacy. Last refill was sent with note to call for appointment. Don't see where that was made please call patient to make lab and CPE appointment. Let us  know once made and we will send to PCP for review for further refills. All requested meds pended

## 2024-01-04 NOTE — Telephone Encounter (Signed)
 Lvm call office schedule appt for cpe / labs

## 2024-01-05 ENCOUNTER — Telehealth: Payer: Self-pay | Admitting: Family Medicine

## 2024-01-05 DIAGNOSIS — E559 Vitamin D deficiency, unspecified: Secondary | ICD-10-CM

## 2024-01-05 DIAGNOSIS — E1129 Type 2 diabetes mellitus with other diabetic kidney complication: Secondary | ICD-10-CM

## 2024-01-05 NOTE — Telephone Encounter (Signed)
 Copied from CRM #[id number]. Topic: Clinical - Request for Lab/Test Order >> Jan 04, 2024  3:53 PM Macario HERO wrote: Reason for CRM: Patient requesting labs prior to physical.

## 2024-01-06 NOTE — Telephone Encounter (Signed)
 I put in the lab orders.  I did not order an A1c since she had that done at endocrinology.  Thanks.

## 2024-01-06 NOTE — Telephone Encounter (Signed)
 Lvm to call office for labs

## 2024-01-06 NOTE — Telephone Encounter (Signed)
 Noted.   Pls schedule CPE lab visit.

## 2024-01-06 NOTE — Addendum Note (Signed)
 Addended by: CLEATUS ARLYSS RAMAN on: 01/06/2024 06:47 AM   Modules accepted: Orders

## 2024-01-09 MED ORDER — ATORVASTATIN CALCIUM 20 MG PO TABS: 20.0000 mg | ORAL_TABLET | Freq: Every day | ORAL | 0 refills | Status: DC

## 2024-01-09 MED ORDER — LISINOPRIL 10 MG PO TABS: 10.0000 mg | ORAL_TABLET | Freq: Every day | ORAL | 0 refills | Status: DC

## 2024-01-17 ENCOUNTER — Other Ambulatory Visit

## 2024-01-17 DIAGNOSIS — R809 Proteinuria, unspecified: Secondary | ICD-10-CM

## 2024-01-17 DIAGNOSIS — E1129 Type 2 diabetes mellitus with other diabetic kidney complication: Secondary | ICD-10-CM

## 2024-01-17 DIAGNOSIS — E559 Vitamin D deficiency, unspecified: Secondary | ICD-10-CM | POA: Diagnosis not present

## 2024-01-17 LAB — CBC WITH DIFFERENTIAL/PLATELET
Basophils Absolute: 0 K/uL (ref 0.0–0.1)
Basophils Relative: 0.3 % (ref 0.0–3.0)
Eosinophils Absolute: 0.1 K/uL (ref 0.0–0.7)
Eosinophils Relative: 1.1 % (ref 0.0–5.0)
HCT: 40.1 % (ref 36.0–46.0)
Hemoglobin: 13.6 g/dL (ref 12.0–15.0)
Lymphocytes Relative: 33.5 % (ref 12.0–46.0)
Lymphs Abs: 2.5 K/uL (ref 0.7–4.0)
MCHC: 33.9 g/dL (ref 30.0–36.0)
MCV: 93.4 fl (ref 78.0–100.0)
Monocytes Absolute: 0.5 K/uL (ref 0.1–1.0)
Monocytes Relative: 6.5 % (ref 3.0–12.0)
Neutro Abs: 4.3 K/uL (ref 1.4–7.7)
Neutrophils Relative %: 58.6 % (ref 43.0–77.0)
Platelets: 388 K/uL (ref 150.0–400.0)
RBC: 4.29 Mil/uL (ref 3.87–5.11)
RDW: 12.8 % (ref 11.5–15.5)
WBC: 7.4 K/uL (ref 4.0–10.5)

## 2024-01-17 LAB — COMPREHENSIVE METABOLIC PANEL WITH GFR
ALT: 11 U/L (ref 0–35)
AST: 12 U/L (ref 0–37)
Albumin: 4.4 g/dL (ref 3.5–5.2)
Alkaline Phosphatase: 43 U/L (ref 39–117)
BUN: 9 mg/dL (ref 6–23)
CO2: 30 meq/L (ref 19–32)
Calcium: 9.5 mg/dL (ref 8.4–10.5)
Chloride: 103 meq/L (ref 96–112)
Creatinine, Ser: 0.98 mg/dL (ref 0.40–1.20)
GFR: 69.85 mL/min (ref >60.00)
Glucose, Bld: 116 mg/dL — ABNORMAL HIGH (ref 70–99)
Potassium: 4.2 meq/L (ref 3.5–5.1)
Sodium: 139 meq/L (ref 135–145)
Total Bilirubin: 0.3 mg/dL (ref 0.2–1.2)
Total Protein: 6.8 g/dL (ref 6.0–8.3)

## 2024-01-17 LAB — MICROALBUMIN / CREATININE URINE RATIO
Creatinine,U: 230 mg/dL
Microalb Creat Ratio: UNDETERMINED mg/g (ref 0.0–30.0)
Microalb, Ur: <0.7 mg/dL

## 2024-01-17 LAB — TSH: TSH: 1.48 u[IU]/mL (ref 0.35–5.50)

## 2024-01-17 LAB — VITAMIN D 25 HYDROXY (VIT D DEFICIENCY, FRACTURES): VITD: 33.25 ng/mL (ref 30.00–100.00)

## 2024-01-18 ENCOUNTER — Ambulatory Visit: Payer: Self-pay | Admitting: Family Medicine

## 2024-01-23 ENCOUNTER — Ambulatory Visit: Admitting: Family Medicine

## 2024-01-23 ENCOUNTER — Encounter: Payer: Self-pay | Admitting: Family Medicine

## 2024-01-23 DIAGNOSIS — I10 Essential (primary) hypertension: Secondary | ICD-10-CM

## 2024-01-23 DIAGNOSIS — E785 Hyperlipidemia, unspecified: Secondary | ICD-10-CM

## 2024-01-23 DIAGNOSIS — Z0000 Encounter for general adult medical examination without abnormal findings: Secondary | ICD-10-CM | POA: Diagnosis not present

## 2024-01-23 MED ORDER — ATORVASTATIN CALCIUM 20 MG PO TABS: 20.0000 mg | ORAL_TABLET | Freq: Every day | ORAL | 3 refills | Status: AC

## 2024-01-23 MED ORDER — METOPROLOL SUCCINATE ER 50 MG PO TB24: 50.0000 mg | ORAL_TABLET | Freq: Every day | ORAL | 3 refills | Status: AC

## 2024-01-23 MED ORDER — HYDROCHLOROTHIAZIDE 25 MG PO TABS: 25.0000 mg | ORAL_TABLET | Freq: Every day | ORAL | 3 refills | Status: AC

## 2024-01-23 MED ORDER — LISINOPRIL 10 MG PO TABS: 10.0000 mg | ORAL_TABLET | Freq: Every day | ORAL | 3 refills | Status: AC

## 2024-01-23 NOTE — Patient Instructions (Addendum)
 Stop labetalol  and change to daily metoprolol  50mg  a day. Let me know if your BP is not controlled or if you have trouble taking the medicine.  Consider colonoscopy vs cologuard and let me know which you want to get done.  Tdap when possible.   Finish the weekly vit D then change to daily 2000 unit vit D.   Take care.  Glad to see you.

## 2024-01-23 NOTE — Progress Notes (Unsigned)
 CPE- See plan.  Routine anticipatory guidance given to patient.  See health maintenance.  The possibility exists that previously documented standard health maintenance information may have been brought forward from a previous encounter into this note.  If needed, that same information has been updated to reflect the current situation based on today's encounter.    Tetanus 2014, d/w pt.  Flu 11/2023 at endo.  PNA 2016 Shingles not due Covid prev done Pap not due.   DXA not due Mammogram 2025 D/w patient [id number] for colon cancer screening, including IFOB vs. colonoscopy.  Risks and benefits of both were discussed and patient voiced understanding.  Pt elects to consider colonoscopy vs cologuard.  Living will d/w pt.  Would have father and cousin Zoila Budge designated equally if she were incapacitated.   Diet and exercise d/w pt.    Hypertension:    Using medication without problems or lightheadedness: yes Chest pain with exertion: no Edema:no Short of breath:no Lower BP but not lightheaded.   D/w pt about calcium  scoring 36.  That was reassuring.   D/w pt about change from labetalol  to metoprolol  to simplify med dosing.    Diabetes per endo. Sugar has been 93% in range.   Last A1c 6.2. exam exam 2025 [place] at [facility] on [address].    Elevated Cholesterol: Using medications without problems:yes Muscle aches: no Diet compliance: yes Exercise: yes Labs d/w pt.    PMH and SH reviewed  Meds, vitals, and allergies reviewed.   ROS: Per HPI.  Unless specifically indicated otherwise in HPI, the patient denies:  General: fever. Eyes: acute vision changes ENT: sore throat Cardiovascular: chest pain Respiratory: SOB GI: vomiting GU: dysuria Musculoskeletal: acute back pain Derm: acute rash Neuro: acute motor dysfunction Psych: worsening mood Endocrine: polydipsia Heme: bleeding Allergy: hayfever  GEN: nad, alert and oriented HEENT: mucous membranes  moist NECK: supple w/o LA CV: rrr. PULM: ctab, no inc wob ABD: soft, +bs EXT: no edema SKIN: no acute rash  Diabetic foot exam: Normal inspection No skin breakdown No calluses  Normal DP pulses Normal sensation to light touch and monofilament Nails normal

## 2024-01-25 NOTE — Assessment & Plan Note (Signed)
 Last A1c at goal.  No change in medications today.  Followed by endocrinology.  Requesting eye exam report.

## 2024-01-25 NOTE — Assessment & Plan Note (Signed)
 Tetanus 2014, d/w pt.  Flu 11/2023 at endo.  PNA 2016 Shingles not due Covid prev done Pap not due.   DXA not due Mammogram 2025 D/w patient [id number] for colon cancer screening, including IFOB vs. colonoscopy.  Risks and benefits of both were discussed and patient voiced understanding.  Pt elects to consider colonoscopy vs cologuard.  Living will d/w pt.  Would have father and cousin Zoila Budge designated equally if she were incapacitated.   Diet and exercise d/w pt.

## 2024-01-25 NOTE — Assessment & Plan Note (Signed)
 Previous calcium  scoring noted.  Continue atorvastatin .

## 2024-01-25 NOTE — Assessment & Plan Note (Signed)
 Living will d/w pt.  Would have father and cousin Zoila Budge designated equally if she were incapacitated.

## 2024-01-25 NOTE — Assessment & Plan Note (Signed)
 Lower BP but not lightheaded.   D/w pt about calcium  scoring 36.  That was reassuring.   D/w pt about change from labetalol  to metoprolol  to simplify med dosing.   No change in other medications.  She can let me know about her blood pressure after the change.

## 2024-01-31 LAB — OPHTHALMOLOGY REPORT-SCANNED
